# Patient Record
Sex: Male | Born: 1960
Health system: Southern US, Community
[De-identification: ages and names within clinical notes are randomized; demographics above are authoritative.]

## PROBLEM LIST (undated history)

## (undated) DIAGNOSIS — M7989 Other specified soft tissue disorders: Secondary | ICD-10-CM

## (undated) DIAGNOSIS — Z8249 Family history of ischemic heart disease and other diseases of the circulatory system: Secondary | ICD-10-CM

## (undated) DIAGNOSIS — K589 Irritable bowel syndrome without diarrhea: Secondary | ICD-10-CM

## (undated) DIAGNOSIS — K5792 Diverticulitis of intestine, part unspecified, without perforation or abscess without bleeding: Secondary | ICD-10-CM

## (undated) DIAGNOSIS — M549 Dorsalgia, unspecified: Secondary | ICD-10-CM

## (undated) DIAGNOSIS — R0789 Other chest pain: Secondary | ICD-10-CM

## (undated) DIAGNOSIS — I2510 Atherosclerotic heart disease of native coronary artery without angina pectoris: Secondary | ICD-10-CM

## (undated) DIAGNOSIS — K219 Gastro-esophageal reflux disease without esophagitis: Secondary | ICD-10-CM

## (undated) DIAGNOSIS — M2550 Pain in unspecified joint: Secondary | ICD-10-CM

## (undated) DIAGNOSIS — M1990 Unspecified osteoarthritis, unspecified site: Secondary | ICD-10-CM

## (undated) DIAGNOSIS — J302 Other seasonal allergic rhinitis: Secondary | ICD-10-CM

## (undated) DIAGNOSIS — K5900 Constipation, unspecified: Secondary | ICD-10-CM

## (undated) DIAGNOSIS — E785 Hyperlipidemia, unspecified: Secondary | ICD-10-CM

## (undated) DIAGNOSIS — K8020 Calculus of gallbladder without cholecystitis without obstruction: Secondary | ICD-10-CM

## (undated) DIAGNOSIS — F419 Anxiety disorder, unspecified: Secondary | ICD-10-CM

## (undated) HISTORY — DX: Other seasonal allergic rhinitis: J30.2

## (undated) HISTORY — DX: Other chest pain: R07.89

## (undated) HISTORY — DX: Hyperlipidemia, unspecified: E78.5

## (undated) HISTORY — DX: Anxiety disorder, unspecified: F41.9

## (undated) HISTORY — DX: Gastro-esophageal reflux disease without esophagitis: K21.9

## (undated) HISTORY — DX: Family history of ischemic heart disease and other diseases of the circulatory system: Z82.49

## (undated) HISTORY — DX: Unspecified osteoarthritis, unspecified site: M19.90

## (undated) HISTORY — PX: TONSILLECTOMY: SUR1361

## (undated) HISTORY — DX: Calculus of gallbladder without cholecystitis without obstruction: K80.20

## (undated) HISTORY — DX: Other specified soft tissue disorders: M79.89

## (undated) HISTORY — DX: Irritable bowel syndrome, unspecified: K58.9

## (undated) HISTORY — DX: Diverticulitis of intestine, part unspecified, without perforation or abscess without bleeding: K57.92

## (undated) HISTORY — DX: Constipation, unspecified: K59.00

## (undated) HISTORY — DX: Dorsalgia, unspecified: M54.9

## (undated) HISTORY — DX: Pain in unspecified joint: M25.50

---

## 1998-04-19 ENCOUNTER — Emergency Department (HOSPITAL_COMMUNITY): Admission: EM | Admit: 1998-04-19 | Discharge: 1998-04-19 | Payer: Self-pay | Admitting: Emergency Medicine

## 2003-03-23 ENCOUNTER — Emergency Department (HOSPITAL_COMMUNITY): Admission: EM | Admit: 2003-03-23 | Discharge: 2003-03-24 | Payer: Self-pay | Admitting: Emergency Medicine

## 2003-07-16 ENCOUNTER — Encounter: Admission: RE | Admit: 2003-07-16 | Discharge: 2003-07-16 | Payer: Self-pay | Admitting: Family Medicine

## 2003-09-10 ENCOUNTER — Encounter: Admission: RE | Admit: 2003-09-10 | Discharge: 2003-09-10 | Payer: Self-pay | Admitting: Family Medicine

## 2005-06-13 ENCOUNTER — Emergency Department (HOSPITAL_COMMUNITY): Admission: EM | Admit: 2005-06-13 | Discharge: 2005-06-13 | Payer: Self-pay | Admitting: Emergency Medicine

## 2005-07-12 ENCOUNTER — Ambulatory Visit: Payer: Self-pay | Admitting: Internal Medicine

## 2005-07-19 ENCOUNTER — Ambulatory Visit: Payer: Self-pay | Admitting: *Deleted

## 2005-07-27 ENCOUNTER — Ambulatory Visit: Payer: Self-pay | Admitting: Internal Medicine

## 2007-10-07 ENCOUNTER — Ambulatory Visit: Payer: Self-pay | Admitting: Internal Medicine

## 2007-10-07 DIAGNOSIS — E785 Hyperlipidemia, unspecified: Secondary | ICD-10-CM

## 2007-10-07 DIAGNOSIS — Z87442 Personal history of urinary calculi: Secondary | ICD-10-CM

## 2008-04-03 HISTORY — PX: CARDIAC CATHETERIZATION: SHX172

## 2008-05-20 ENCOUNTER — Ambulatory Visit: Payer: Self-pay | Admitting: Family Medicine

## 2008-05-20 DIAGNOSIS — R079 Chest pain, unspecified: Secondary | ICD-10-CM

## 2008-05-21 ENCOUNTER — Encounter (INDEPENDENT_AMBULATORY_CARE_PROVIDER_SITE_OTHER): Payer: Self-pay | Admitting: *Deleted

## 2008-06-23 ENCOUNTER — Encounter: Admission: RE | Admit: 2008-06-23 | Discharge: 2008-06-23 | Payer: Self-pay | Admitting: Cardiovascular Disease

## 2008-06-23 ENCOUNTER — Encounter: Payer: Self-pay | Admitting: Internal Medicine

## 2008-06-24 ENCOUNTER — Ambulatory Visit (HOSPITAL_COMMUNITY): Admission: RE | Admit: 2008-06-24 | Discharge: 2008-06-24 | Payer: Self-pay | Admitting: Cardiovascular Disease

## 2008-07-09 ENCOUNTER — Encounter: Payer: Self-pay | Admitting: Family Medicine

## 2008-10-08 ENCOUNTER — Ambulatory Visit: Payer: Self-pay | Admitting: Family Medicine

## 2008-10-09 ENCOUNTER — Encounter (INDEPENDENT_AMBULATORY_CARE_PROVIDER_SITE_OTHER): Payer: Self-pay | Admitting: *Deleted

## 2008-10-09 LAB — CONVERTED CEMR LAB
ALT: 31 units/L (ref 0–53)
AST: 23 units/L (ref 0–37)
Albumin: 3.9 g/dL (ref 3.5–5.2)
Alkaline Phosphatase: 66 units/L (ref 39–117)
BUN: 10 mg/dL (ref 6–23)
Basophils Absolute: 0 10*3/uL (ref 0.0–0.1)
Basophils Relative: 0.4 % (ref 0.0–3.0)
Bilirubin, Direct: 0.1 mg/dL (ref 0.0–0.3)
CO2: 29 meq/L (ref 19–32)
Calcium: 8.9 mg/dL (ref 8.4–10.5)
Chloride: 105 meq/L (ref 96–112)
Cholesterol: 180 mg/dL (ref 0–200)
Creatinine, Ser: 1 mg/dL (ref 0.4–1.5)
Eosinophils Absolute: 0.3 10*3/uL (ref 0.0–0.7)
Eosinophils Relative: 3.7 % (ref 0.0–5.0)
GFR calc non Af Amer: 85.1 mL/min (ref >60)
Glucose, Bld: 93 mg/dL (ref 70–99)
HCT: 44.4 % (ref 39.0–52.0)
HDL: 40.7 mg/dL (ref >39.00)
Hemoglobin: 15.1 g/dL (ref 13.0–17.0)
LDL Cholesterol: 125 mg/dL — ABNORMAL HIGH (ref 0–99)
Lymphocytes Relative: 19.8 % (ref 12.0–46.0)
Lymphs Abs: 1.5 10*3/uL (ref 0.7–4.0)
MCHC: 34 g/dL (ref 30.0–36.0)
MCV: 86.7 fL (ref 78.0–100.0)
Monocytes Absolute: 0.6 10*3/uL (ref 0.1–1.0)
Monocytes Relative: 8.1 % (ref 3.0–12.0)
Neutro Abs: 5.2 10*3/uL (ref 1.4–7.7)
Neutrophils Relative %: 68 % (ref 43.0–77.0)
Platelets: 243 10*3/uL (ref 150.0–400.0)
Potassium: 4 meq/L (ref 3.5–5.1)
RBC: 5.13 M/uL (ref 4.22–5.81)
RDW: 12.2 % (ref 11.5–14.6)
Sodium: 140 meq/L (ref 135–145)
TSH: 1.43 microintl units/mL (ref 0.35–5.50)
Total Bilirubin: 0.8 mg/dL (ref 0.3–1.2)
Total CHOL/HDL Ratio: 4
Total Protein: 6.6 g/dL (ref 6.0–8.3)
Triglycerides: 73 mg/dL (ref 0.0–149.0)
VLDL: 14.6 mg/dL (ref 0.0–40.0)
WBC: 7.6 10*3/uL (ref 4.5–10.5)

## 2010-01-05 ENCOUNTER — Encounter: Payer: Self-pay | Admitting: Internal Medicine

## 2010-04-01 ENCOUNTER — Ambulatory Visit
Admission: RE | Admit: 2010-04-01 | Discharge: 2010-04-01 | Payer: Self-pay | Source: Home / Self Care | Attending: Internal Medicine | Admitting: Internal Medicine

## 2010-04-01 DIAGNOSIS — K219 Gastro-esophageal reflux disease without esophagitis: Secondary | ICD-10-CM | POA: Insufficient documentation

## 2010-04-01 DIAGNOSIS — K6289 Other specified diseases of anus and rectum: Secondary | ICD-10-CM

## 2010-04-01 DIAGNOSIS — H0520 Unspecified exophthalmos: Secondary | ICD-10-CM | POA: Insufficient documentation

## 2010-04-01 LAB — CONVERTED CEMR LAB
Basophils Absolute: 0 10*3/uL (ref 0.0–0.1)
Basophils Relative: 0.4 % (ref 0.0–3.0)
Eosinophils Absolute: 0.3 10*3/uL (ref 0.0–0.7)
Eosinophils Relative: 3.4 % (ref 0.0–5.0)
H Pylori IgG: NEGATIVE
HCT: 45.1 % (ref 39.0–52.0)
Hemoglobin: 15.3 g/dL (ref 13.0–17.0)
Lymphocytes Relative: 20 % (ref 12.0–46.0)
Lymphs Abs: 1.9 10*3/uL (ref 0.7–4.0)
MCHC: 34 g/dL (ref 30.0–36.0)
MCV: 85.9 fL (ref 78.0–100.0)
Monocytes Absolute: 0.9 10*3/uL (ref 0.1–1.0)
Monocytes Relative: 9.3 % (ref 3.0–12.0)
Neutro Abs: 6.2 10*3/uL (ref 1.4–7.7)
Neutrophils Relative %: 66.9 % (ref 43.0–77.0)
Platelets: 291 10*3/uL (ref 150.0–400.0)
RBC: 5.25 M/uL (ref 4.22–5.81)
RDW: 14.1 % (ref 11.5–14.6)
TSH: 1.37 microintl units/mL (ref 0.35–5.50)
WBC: 9.2 10*3/uL (ref 4.5–10.5)

## 2010-05-03 NOTE — Letter (Signed)
Summary: [facility]  [facility]   Imported By: Lanelle Bal 01/21/2010 08:12:59  _____________________________________________________________________  External Attachment:    Type:   Image     Comment:   External Document

## 2010-05-05 NOTE — Assessment & Plan Note (Signed)
Summary: acid reflux, coughing alot, throat sore; hemorroids///sph   Vital Signs:  Patient profile:   50 year old male Weight:      318.2 pounds BMI:     36.44 Temp:     98.7 degrees F oral Pulse rate:   76 / minute Resp:     14 per minute BP sitting:   132 / 88  (left arm) Cuff size:   large  Vitals Entered By: Shonna Chock CMA (April 01, 2010 12:17 PM) CC: Acid reflux, sore throat & cough (related to acid reflux)- x 1 year or more and hemorrhoids x 1 year or more, Heartburn   CC:  Acid reflux, sore throat & cough (related to acid reflux)- x 1 year or more and hemorrhoids x 1 year or more, and Heartburn.  History of Present Illness:      This is a 50 year old man who presents with  GERD , progressive over past year  The patient reports acid reflux, sour taste in mouth, and chest pain ( EKG WNL 1 month ago @ Dr Hazle Coca), but denies epigastric pain and trouble swallowing.  The patient reports the following alarm features of dyspepsia: melena for 2 days.  The patient denies the following alarm features: dysphagia, hematemesis, vomiting, and history of anemia.  Symptoms are worse with spicy foods (tomatoes), sugar,and citrus.  No diagnostic studies to date.  Treatment that was tried  found to be  only partially effective  H2 blocker, Zantac 150  once daily - two times a day .Father had pancreatic cancer; no FH DUD, colon polyps or cancer.  Current Medications (verified): 1)  Zantac 150 Maximum Strength 150 Mg Tabs (Ranitidine Hcl) .[name].. 1 By Mouth Once Daily  Allergies (verified): No Known Drug Allergies  Review of Systems CV:  Denies leg cramps with exertion and palpitations; No exertional chest pain. GI:  rectal bleeding ? from hemorrhoids.  Physical Exam  General:  in no acute distress; alert,appropriate and cooperative throughout examination Eyes:  No corneal or conjunctival inflammation noted. Exopthalmus OD > OS. No lid lag FOV WNL Mouth:  Oral mucosa and oropharynx without  lesions or exudates.  Teeth in good repair. Mild pharyngeal erythema.   Neck:  No deformities, masses, or tenderness noted. Lungs:  Normal respiratory effort, chest expands symmetrically. Lungs are clear to auscultation, no crackles or wheezes. Heart:  Normal rate and regular rhythm. S1 and S2 normal without gallop, murmur, click, rub or other extra sounds. Abdomen:  Bowel sounds positive,abdomen soft and non-tender without masses, organomegaly or hernias noted. Rectal:  External   inflammation noted. Normal sphincter tone. No rectal masses or tenderness. No stool obtained  Pulses:  R and L carotid,radial,,dorsalis pedis and posterior tibial pulses are full and equal bilaterally Skin:  Intact without suspicious lesions or rashes. No jaundice Cervical Nodes:  No lymphadenopathy noted Axillary Nodes:  No palpable lymphadenopathy   Impression & Recommendations:  Problem # 1:  GERD (ICD-530.81)  The following medications were removed from the medication list:    Zantac 150 Maximum Strength 150 Mg Tabs (Ranitidine hcl) .[name]... 1 by mouth once daily His updated medication list for this problem includes:    Omeprazole 20 Mg Cpdr (Omeprazole) .[name]... 1 two times a day 30 min pre meals  Orders: Venipuncture (16109) TLB-CBC Platelet - w/Differential (85025-CBCD) TLB-H. Pylori Abs(Helicobacter Pylori) (86677-HELICO)  Problem # 2:  ANAL OR RECTAL PAIN (UEA-540.98)  Problem # 3:  EXOPHTHALMOS (ICD-376.30)  Orders: TLB-TSH (Thyroid Stimulating Hormone) (  84443-TSH)  Complete Medication List: 1)  Omeprazole 20 Mg Cpdr (Omeprazole) .[name].. 1 two times a day 30 min pre meals 2)  Proctofoam 1 % Foam (Pramoxine hcl) .... Apply two times a day after soaks  Patient Instructions: 1)  Complete stool cards. 2)  Avoid foods high in acid (tomatoes, citrus juices, spicy foods). Avoid eating within two hours of lying down or before exercising. Do not over eat; try smaller more frequent meals. Elevate head of bed  twelve inches when sleeping. Prescriptions: PROCTOFOAM 1 % FOAM (PRAMOXINE HCL) apply two times a day after soaks  #1 x 1   Entered and Authorized by:   Marga Melnick MD   Signed by:   Marga Melnick MD on 04/01/2010   Method used:   Print then Give to Patient   RxID:   [id number] OMEPRAZOLE 20 MG CPDR (OMEPRAZOLE) 1 two times a day 30 min pre meals  #60 x 1   Entered and Authorized by:   Marga Melnick MD   Signed by:   Marga Melnick MD on 04/01/2010   Method used:   Print then Give to Patient   RxID:   [id number]    Orders Added: 1)  Est. Patient Level IV [60109] 2)  Venipuncture [32355] 3)  TLB-CBC Platelet - w/Differential [85025-CBCD] 4)  TLB-H. Pylori Abs(Helicobacter Pylori) [86677-HELICO] 5)  TLB-TSH (Thyroid Stimulating Hormone) [73220-URK]

## 2010-06-02 ENCOUNTER — Encounter: Payer: Self-pay | Admitting: Internal Medicine

## 2010-08-06 ENCOUNTER — Encounter: Payer: Self-pay | Admitting: Internal Medicine

## 2010-08-10 ENCOUNTER — Encounter: Payer: Self-pay | Admitting: Internal Medicine

## 2010-08-16 NOTE — Cardiovascular Report (Signed)
NAME:  Dennis Castro, Dennis Castro NO.:  [id number]   MEDICAL RECORD NO.:  [id number]          PATIENT TYPE:  OIB   LOCATION:  2899                         FACILITY:  [facility]   PHYSICIAN:  Nanetta Batty, M.D.   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  He  had a negative Cardiolite on May 26, 2003, and was referred for  evaluation of chest pain.  His risk factor profile is benign except for  family history of heart disease and mother had PCI and CABG in her early  7s.  He also has a history of GERD; however, his symptoms of chest pain  are somewhat different than this.  A Myoview stress test recently  performed did show subtle anteroapical ischemia.  The patient presents  today for an outpatient diagnostic coronary arteriography to define his  anatomy and to rule out ischemic etiology.   PROCEDURE DESCRIPTION:  The patient was brought to the second floor  [place] cardiac cath lab in the postabsorptive state.  Premedicated  with p.o. Valium.  Right groin was prepped and shaved in the usual  sterile fashion.  Xylocaine 1% was used for local anesthesia.  A 6-  French sheath was inserted into the right femoral artery using standard  Seldinger technique.  A 6-French left Judkins diagnostic catheter as  well as 6-French pigtail catheter were used for selective coronary  angiography, left ventriculography respectively.  Visipaque dye was used  for the entirety of the case.  Aorta, ventricular and pullback pressures  were recorded.   HEMODYNAMICS:  1. Aortic systolic pressure 117 and diastolic pressure is 77.  2. Left ventricular systolic pressure 122, end-diastolic pressure 17.   SELECTIVE CHOLANGIOGRAPHY:  1. Left main normal.  2.  LAD; the LAD had a 30% hypodense segmental stenosis in the mid      third of the vessel.  3. Left circumflex; free of significant disease.  4. Ramus branch; small in size and free of significant disease.  5. Right coronary artery; dominant and free of significant disease.  6. Left ventriculography; RAO left ventriculogram was performed using      25 mL of Visipaque dye at 12 mL per second.  The overall LVEF was      estimated at approximately 50-55% with very subtle anteroapical      hypokinesia.   IMPRESSION:  Mr. Klopfenstein has noncritical coronary artery disease with  mild segmental mid left anterior descending disease and mild  anteroapical hypokinesia.  While this could conceivably be flow limiting  lesion, at this point, I am going to treat him medically with beta  blockers, statin drugs, and risk factor modification.  If indeed he  continues to have chest pain consistent with angina.  He may require  intravascular ultrasound-guided percutaneous coronary intervention  stenting of his mid-left anterior descending.   Sheath was removed and pressure was  held in the groin to achieve  hemostasis.  The patient left the lab in stable condition.  He will be  discharged home, and we will see him as an outpatient and will see me  back in the office in 1 or 2 weeks for followup.      Nanetta Batty, M.D.  Electronically Signed     JB/MEDQ  D:  06/24/2008  T:  06/24/2008  Job:  [id number]   cc:   Second Floor [facility]  [facility]  Neena Rhymes, M.D.

## 2010-08-19 NOTE — H&P (Signed)
NAME:  Dennis Castro, Dennis Castro               ACCOUNT NO.:  [id number]   MEDICAL RECORD NO.:  [id number]          PATIENT TYPE:  EMS   LOCATION:  MAJO                         FACILITY:  [facility]   PHYSICIAN:  Mobolaji B. Bakare, M.D.DATE OF BIRTH:  1960-10-27   DATE OF ADMISSION:  06/13/2005  DATE OF DISCHARGE:                                HISTORY & PHYSICAL   PRIMARY CARE PHYSICIAN:  Dr. Duaine Dredge   CHIEF COMPLAINT:  Chest tightness x2 weeks with no associated shortness of  breath during this period of chest tightness which lasts momentarily for  approximately 1-2 minutes. There is no wheezing. The patient does not smoke  cigarettes. Symptoms do not occur when he walks normally.   HISTORY OF PRESENT ILLNESS:  Dennis Castro is a 50 year old Caucasian male with  no significant medical problems. He started experiencing chest tightness on  exertion in the last 2 weeks. There is no clear cut chest pain. He has been  noticing shortness of breath whenever he gets to the end of a staircase at  home, and also when he lifts up his 49 pound 62-year-old son. There has been  no associated diaphoresis, nausea, vomiting, palpitations. He has does have  a history of acid reflux for which he uses Zantac as needed. He decided to  come to the emergency room for evaluation. He had an EKG which was normal  sinus rhythm, no ST changes. Cardiac enzymes at the point of care were  negative. He had a CT scan of the chest which was negative for pulmonary  embolism. There is no pericardial effusion. Normal CT. He has been admitted  for rule out myocardial infarction.   REVIEW OF SYSTEMS:  He denies fever, cough, shortness of breath, abdominal  pain, no change in micturition.   PAST MEDICAL HISTORY:  Gastroesophageal reflux disease.   PAST SURGICAL HISTORY:  None.   MEDICATIONS:  1.  Zantac 150 mg p.r.n.  2.  Gaviscon p.r.n.   ALLERGIES:  No known drug allergies.   FAMILY HISTORY:  His mother had bypass at age 19  after a heart attack.  Father has no known medical problems. He has three sisters and two brothers,  all are well.   SOCIAL HISTORY:  He is married. He has never smoked cigarettes and does not  drink alcohol. He is a Administrator, [employer].   PHYSICAL EXAMINATION:  VITAL SIGNS:  Temperature 98.5, blood pressure  120/69, pulse of 84, respiratory rate of 20, O2 saturations of 97%.  GENERAL:  The patient is obese, not in respiratory distress.  HEENT:  Normocephalic, atraumatic head. Pupils are equal, round and reactive  to light. Not pale. Anicteric. No elevated JVD. Mucosa red and moist.  LUNGS:  Clear clinically to auscultation. There is no wheeze, no rhonchi and  no crackles.  CARDIOVASCULAR:  S1, S2 regular, no murmur, no gallop or rub.  ABDOMEN:  Obese, soft, nontender. No palpable organomegaly, no holosystolic  murmur.  EXTREMITIES:  No pedal edema, calf tenderness. Dorsalis pedis pulses 2+  bilaterally.  CNS:  Nonfocal, no neurological deficit.   INITIAL LABORATORY  DATA:  White cell count 9.7 thousand, hemoglobin 13.8,  hematocrit 29.9, MCV 84.6, platelets 292,000.  Differential: Neutrophils 70%, lymphocytes 19%. Sodium 139, potassium 3.9,  chloride 107, BUN 13, glucose 90, bicarb 25. Hematocrit 42, hemoglobin 14.3.  Creatinine 1.0. BUN 13. PT 13.1, INR 1.0. Myoglobin 54.9. Troponin less than  0.05. Two sets of cardiac enzymes at point of care were negative.  EKG showed normal sinus rhythm with a heart rate of 76, normal intervals. CT  of chest with no definite acute PE, unremarkable CT. No pericardial  effusion, parenchymal's are normal. Chest x-ray with no acute  cardiopulmonary disease.   ASSESSMENT AND PLAN:  PROBLEM #1. Dennis Castro is a 50 year old Caucasian male  with no known cardiovascular risk factors, presenting with chest tightness  without any chest pain, wheezing or shortness of breath. Chest CT scan is  unremarkable.  I doubt this is related to cardiac  ischemia. The patient is obese, he is  probably deconditioned. Will admit to telemetry floor, obtain serial cardiac  enzymes, start aspirin 325 mg p.o. daily, nitroglycerin sublingual 1.4 mg  q.5 minutes p.r.n. for chest pain. Obtain fasting lipid profile, hemoglobin  A1C, fasting blood sugar.  PROBLEM #2. Gastroesophageal reflux disease. Will start on regular proton  pump inhibitor with Protonix 40 mg daily. He can use Gaviscon p.r.n.  PROBLEM #3 Obesity. Will counsel on weight loss. I spoke to patient with  respect to increasing activities. He indicated understanding and would  pursue weight loss.      Mobolaji B. Corky Downs, M.D.  Electronically Signed     MBB/MEDQ  D:  06/13/2005  T:  06/13/2005  Job:  [id number]   cc:   Mosetta Putt, M.D.  Fax: [phone]

## 2010-10-27 ENCOUNTER — Encounter: Payer: Self-pay | Admitting: Internal Medicine

## 2010-11-25 ENCOUNTER — Encounter: Payer: Self-pay | Admitting: Internal Medicine

## 2011-02-01 ENCOUNTER — Encounter: Payer: Self-pay | Admitting: Internal Medicine

## 2011-02-23 ENCOUNTER — Encounter (HOSPITAL_BASED_OUTPATIENT_CLINIC_OR_DEPARTMENT_OTHER): Payer: Self-pay | Admitting: *Deleted

## 2011-02-23 ENCOUNTER — Emergency Department (HOSPITAL_BASED_OUTPATIENT_CLINIC_OR_DEPARTMENT_OTHER)
Admission: EM | Admit: 2011-02-23 | Discharge: 2011-02-23 | Disposition: A | Discharge status: Home / Self Care | Payer: BC Managed Care – PPO | Attending: Emergency Medicine | Admitting: Emergency Medicine

## 2011-02-23 DIAGNOSIS — H5710 Ocular pain, unspecified eye: Secondary | ICD-10-CM | POA: Insufficient documentation

## 2011-02-23 DIAGNOSIS — S0500XA Injury of conjunctiva and corneal abrasion without foreign body, unspecified eye, initial encounter: Secondary | ICD-10-CM

## 2011-02-23 DIAGNOSIS — H18829 Corneal disorder due to contact lens, unspecified eye: Secondary | ICD-10-CM | POA: Insufficient documentation

## 2011-02-23 MED ORDER — FLUORESCEIN SODIUM 1 MG OP STRP
1.0000 | ORAL_STRIP | Freq: Once | OPHTHALMIC | Status: AC
  Administered 2011-02-23: 12:00:00 via OPHTHALMIC

## 2011-02-23 MED ORDER — GENTAMICIN SULFATE 0.3 % OP SOLN: 1.0000 [drp] | OPHTHALMIC | Status: AC

## 2011-02-23 MED ORDER — TETRACAINE HCL 0.5 % OP SOLN
2.0000 [drp] | Freq: Once | OPHTHALMIC | Status: AC
  Administered 2011-02-23: 12:00:00 via OPHTHALMIC

## 2011-02-23 MED ORDER — HYDROCODONE-ACETAMINOPHEN 5-500 MG PO TABS: 1.0000 | ORAL_TABLET | Freq: Four times a day (QID) | ORAL | Status: AC | PRN

## 2011-02-23 MED ORDER — TETRACAINE HCL 0.5 % OP SOLN
OPHTHALMIC | Status: AC
  Filled 2011-02-23: qty 2

## 2011-02-23 MED ORDER — FLUORESCEIN SODIUM 1 MG OP STRP
ORAL_STRIP | OPHTHALMIC | Status: AC
  Filled 2011-02-23: qty 1

## 2011-02-23 NOTE — ED Notes (Signed)
Pt amb to room 10 with quick steady gait. Pt reports he was wearing his contacts at work yesterday and noticed "an irritation" in his left eye. Pt states "It feels like there is something in there..." pt states he did not remove his contacts until 7pm last night, left eye is red and irritated.

## 2011-02-23 NOTE — ED Provider Notes (Addendum)
History     CSN: [id number] Arrival date & time: 02/23/2011 10:27 AM   First MD Initiated Contact with Patient 02/23/11 1127      Chief Complaint  Patient presents with  . Eye Problem     HPI  Presents with left eye pain. Patient complaining of 2 days of foreign body sensation in left eye. He thinks he left his context and atenolol. He complains of mild blurred vision in the left eye. He denies headache, nausea, vomiting. He did participate in leaf blowing approximately 5 days ago. He has been wearing his bifocals today.   ED Notes, ED Provider Notes from 02/23/11 0000 to 02/23/11 10:36:46       Amy Theotis Barrio, RN 02/23/2011 10:35      Pt amb to room 10 with quick steady gait. Pt reports he was wearing his contacts at work yesterday and noticed "an irritation" in his left eye. Pt states "It feels like there is something in there..." pt states he did not remove his contacts until 7pm last night, left eye is red and irritated.     History reviewed. No pertinent past medical history.  Past Surgical History  Procedure Date  . Tonsillectomy     Family History  Problem Relation Age of Onset  . Diabetes Mother   . Heart disease Mother     cabg  . Heart attack Mother   . Cancer Father     prostate cancer and panceatic cancer  . Cancer Maternal Grandmother     breast cancer    History  Substance Use Topics  . Smoking status: Never Smoker   . Smokeless tobacco: Not on file  . Alcohol Use: No    Review of Systems except as noted HPI  Allergies  Review of patient's allergies indicates no known allergies.  Home Medications   Current Outpatient Rx  Name Route Sig Dispense Refill  . GENTAMICIN SULFATE 0.3 % OP SOLN Left Eye Place 1 drop into the left eye every 4 (four) hours. 15 mL 0    6 times per day x 5 days  . HYDROCODONE-ACETAMINOPHEN 5-500 MG PO TABS Oral Take 1-2 tablets by mouth every 6 (six) hours as needed for pain. 15 tablet 0  . HYDROCORTISONE ACE-PRAMOXINE  1-1 % RE FOAM Rectal Place 1 applicator rectally 2 (two) times daily.      [name] OMEPRAZOLE 20 MG PO CPDR Oral Take 20 mg by mouth daily.        BP 136/82  Pulse 88  Temp(Src) 98.4 F (36.9 C) (Oral)  Resp 18  Ht 6\' 5"  (1.956 m)  Wt 300 lb (136.079 kg)  BMI 35.57 kg/m2  SpO2 100%  Physical Exam  Nursing note and vitals reviewed. Constitutional: He is oriented to person, place, and time. He appears well-developed and well-nourished. No distress.  HENT:  Head: Atraumatic.  Mouth/Throat: Oropharynx is clear and moist.  Eyes: Conjunctivae are normal. Pupils are equal, round, and reactive to light. Left eye exhibits no discharge.       Left eye injected conjunctiva Relief of pain with tetracaine administration No FB under eyelid Small linear uptake flouroscein under UV light  Neck: Neck supple.  Cardiovascular: Normal rate, regular rhythm, normal heart sounds and intact distal pulses.  Exam reveals no gallop and no friction rub.   No murmur heard. Pulmonary/Chest: Effort normal. No respiratory distress. He has no wheezes. He has no rales.  Abdominal: Soft. Bowel sounds are normal. There is no tenderness.  There is no rebound and no guarding.  Musculoskeletal: Normal range of motion. He exhibits no edema and no tenderness.  Neurological: He is alert and oriented to person, place, and time.  Skin: Skin is warm and dry.  Psychiatric: He has a normal mood and affect.    ED Course  Procedures (including critical care time)  Labs Reviewed - No data to display No results found.   1. Corneal abrasion       MDM  Corneal abrasion with slightly dec visual acuity Lt eye. Pain control, abx with pseudomonas coverage. Advised against wearing contacts until seen in f/u with ophtho. Given precautions for return.   Stefano Gaul, MD         Forbes Cellar, MD 02/23/11 1542  Forbes Cellar, MD 02/23/11 [phone]

## 2011-04-18 ENCOUNTER — Encounter: Payer: Self-pay | Admitting: Internal Medicine

## 2011-06-22 ENCOUNTER — Encounter: Payer: Self-pay | Admitting: Internal Medicine

## 2011-08-25 ENCOUNTER — Encounter: Payer: Self-pay | Admitting: Internal Medicine

## 2011-10-31 ENCOUNTER — Encounter: Payer: Self-pay | Admitting: Internal Medicine

## 2012-01-05 ENCOUNTER — Encounter: Payer: Self-pay | Admitting: Internal Medicine

## 2012-01-16 ENCOUNTER — Ambulatory Visit (INDEPENDENT_AMBULATORY_CARE_PROVIDER_SITE_OTHER): Payer: BC Managed Care – PPO | Admitting: Internal Medicine

## 2012-01-16 ENCOUNTER — Ambulatory Visit (HOSPITAL_BASED_OUTPATIENT_CLINIC_OR_DEPARTMENT_OTHER)
Admission: RE | Admit: 2012-01-16 | Discharge: 2012-01-16 | Disposition: A | Discharge status: Home / Self Care | Payer: BC Managed Care – PPO | Source: Ambulatory Visit | Attending: Internal Medicine | Admitting: Internal Medicine

## 2012-01-16 ENCOUNTER — Encounter: Payer: Self-pay | Admitting: Internal Medicine

## 2012-01-16 VITALS — BP 126/88 | HR 74 | Temp 98.5°F | Wt 321.0 lb

## 2012-01-16 DIAGNOSIS — Z8249 Family history of ischemic heart disease and other diseases of the circulatory system: Secondary | ICD-10-CM

## 2012-01-16 DIAGNOSIS — E785 Hyperlipidemia, unspecified: Secondary | ICD-10-CM

## 2012-01-16 DIAGNOSIS — K219 Gastro-esophageal reflux disease without esophagitis: Secondary | ICD-10-CM

## 2012-01-16 DIAGNOSIS — R079 Chest pain, unspecified: Secondary | ICD-10-CM

## 2012-01-16 DIAGNOSIS — R0789 Other chest pain: Secondary | ICD-10-CM | POA: Insufficient documentation

## 2012-01-16 LAB — CK TOTAL AND CKMB (NOT AT ARMC)
CK, MB: 2 ng/mL (ref 0.3–4.0)
Total CK: 64 U/L (ref 7–232)

## 2012-01-16 LAB — TROPONIN I: Troponin I: <0.01 ng/mL (ref <0.06)

## 2012-01-16 NOTE — Progress Notes (Signed)
Subjective:    Patient ID: Dennis Castro, male    DOB: Sep 17, 1960, 51 y.o.   MRN: [id number]  HPI He describes intermittent "jab or burning" type left chest pain lasting a split second since 01/15/12. It occurs at rest every 30-60 minutes without predisposition.  There is no associated dyspnea, nausea, or sweating. He denies palpitations, paroxysmal nocturnal dyspnea,cough, hemoptysis, pleuritic character, or claudication. He questions whether he has some ankle edema.  Lipids were done at his job 6 months ago; they were "borderline". He had an essentially negative cardiac catheterization in 2010 by Dr. Allyson Sabal.    Past medical history/family history/social history were all reviewed and updated. Pertinent data: Mother MI @ 60  Review of Systems   There was no predisposition such as immobilization, trauma or repetitive action or lifting. There is no definite relieving factor. He's had no associated dizziness or syncope.  He denies associated abdominal pain , dysphagia, rectal bleeding or melena. He previously took omeprazole for reflux but now takes Zantac as needed.        Objective:   Physical Exam General appearance :well nourished w/o distress.  Eyes: No conjunctival inflammation or scleral icterus is present.Slight anisocoria ; OS > OD  Oral exam: Dental hygiene is good; lips and gums are healthy appearing.There is no oropharyngeal erythema or exudate noted.   Heart:  Normal rate and regular rhythm. S1 and S2 normal without gallop, murmur, click, rub. S 4   No costochondritic pain elicitable  Lungs:Chest clear to auscultation; no wheezes, rhonchi,rales ,or rubs present.No increased work of breathing.   Abdomen: bowel sounds normal, soft and non-tender without masses, organomegaly or hernias noted.  No guarding or rebound   Skin:Warm & dry.  Intact without suspicious lesions or rashes ; no jaundice or tenting  Lymphatic: No lymphadenopathy is noted about the head, neck,  axilla areas.  Vascular: All pulses intact without bruits  Extremities: No cyanosis, clubbing, or edema. Homans sign negative  Neuro: Alert and oriented              Assessment & Plan:  #1 atypical chest pain; EKG reveals minor early repolarization ST-T changes in 1 PAC. No ischemic changes present  #2 dyslipidemia  #3 past history of reflux  Plan: See orders and recommendations

## 2012-01-16 NOTE — Patient Instructions (Addendum)
The triggers for reflux  include stress; the "aspirin family" ; alcohol; peppermint; and caffeine (coffee, tea, cola, and chocolate). The aspirin family would include aspirin and the nonsteroidal agents such as ibuprofen &  Naproxen. Tylenol would not cause reflux. If having symptoms ; food & drink should be avoided for @ least 2 hours before going to bed. Take Zantac twice a day pending completion of evaluation. EKG is normal but there are minor ST-T changes of early repolarization. These are normal variants but could be mistaken for acute injury. This EKG should be available for comparison if  seen emergently. To prevent  premature beats, avoid stimulants such as decongestants, diet pills, nicotine, or caffeine (coffee, tea, cola, or chocolate) to excess.  Please take enteric-coated aspirin 81 mg daily with breakfast.  Order for x-rays entered into  the computer; these will be performed at [facility] No appointment is necessary.   If you activate My Chart; the results can be released to you as soon as they populate from the lab. If you choose not to use this program; the labs have to be reviewed, copied & mailed causing a delay in getting the results to you.

## 2012-01-17 LAB — CBC WITH DIFFERENTIAL/PLATELET
Basophils Absolute: 0.1 10*3/uL (ref 0.0–0.1)
Basophils Relative: 0.7 % (ref 0.0–3.0)
Eosinophils Absolute: 0.3 10*3/uL (ref 0.0–0.7)
Eosinophils Relative: 2.9 % (ref 0.0–5.0)
HCT: 46 % (ref 39.0–52.0)
Hemoglobin: 15.1 g/dL (ref 13.0–17.0)
Lymphocytes Relative: 18.5 % (ref 12.0–46.0)
Lymphs Abs: 1.8 10*3/uL (ref 0.7–4.0)
MCHC: 32.8 g/dL (ref 30.0–36.0)
MCV: 86.8 fl (ref 78.0–100.0)
Monocytes Absolute: 0.7 10*3/uL (ref 0.1–1.0)
Monocytes Relative: 7.1 % (ref 3.0–12.0)
Neutro Abs: 6.7 10*3/uL (ref 1.4–7.7)
Neutrophils Relative %: 70.8 % (ref 43.0–77.0)
Platelets: 275 10*3/uL (ref 150.0–400.0)
RBC: 5.3 Mil/uL (ref 4.22–5.81)
RDW: 13.5 % (ref 11.5–14.6)
WBC: 9.5 10*3/uL (ref 4.5–10.5)

## 2012-01-17 LAB — NMR, LIPOPROFILE

## 2012-02-05 ENCOUNTER — Encounter: Payer: Self-pay | Admitting: Internal Medicine

## 2012-02-19 ENCOUNTER — Encounter: Payer: Self-pay | Admitting: Internal Medicine

## 2012-03-25 ENCOUNTER — Encounter: Payer: Self-pay | Admitting: Internal Medicine

## 2012-03-25 ENCOUNTER — Ambulatory Visit (INDEPENDENT_AMBULATORY_CARE_PROVIDER_SITE_OTHER): Payer: BC Managed Care – PPO | Admitting: Internal Medicine

## 2012-03-25 VITALS — BP 118/86 | HR 71 | Temp 98.3°F | Resp 16 | Ht 78.03 in | Wt 325.4 lb

## 2012-03-25 DIAGNOSIS — Z8042 Family history of malignant neoplasm of prostate: Secondary | ICD-10-CM | POA: Insufficient documentation

## 2012-03-25 DIAGNOSIS — R6882 Decreased libido: Secondary | ICD-10-CM

## 2012-03-25 DIAGNOSIS — Z0000 Encounter for general adult medical examination without abnormal findings: Secondary | ICD-10-CM

## 2012-03-25 DIAGNOSIS — Z1211 Encounter for screening for malignant neoplasm of colon: Secondary | ICD-10-CM

## 2012-03-25 DIAGNOSIS — N529 Male erectile dysfunction, unspecified: Secondary | ICD-10-CM

## 2012-03-25 LAB — LIPID PANEL
Cholesterol: 188 mg/dL (ref 0–200)
HDL: 40.1 mg/dL (ref >39.00)
LDL Cholesterol: 125 mg/dL — ABNORMAL HIGH (ref 0–99)
Total CHOL/HDL Ratio: 5
Triglycerides: 115 mg/dL (ref 0.0–149.0)
VLDL: 23 mg/dL (ref 0.0–40.0)

## 2012-03-25 LAB — HEPATIC FUNCTION PANEL
ALT: 40 U/L (ref 0–53)
AST: 20 U/L (ref 0–37)
Albumin: 4 g/dL (ref 3.5–5.2)
Alkaline Phosphatase: 67 U/L (ref 39–117)
Bilirubin, Direct: 0.1 mg/dL (ref 0.0–0.3)
Total Bilirubin: 0.7 mg/dL (ref 0.3–1.2)
Total Protein: 6.5 g/dL (ref 6.0–8.3)

## 2012-03-25 LAB — PSA: PSA: 0.91 ng/mL (ref 0.10–4.00)

## 2012-03-25 LAB — BASIC METABOLIC PANEL
BUN: 16 mg/dL (ref 6–23)
CO2: 24 mEq/L (ref 19–32)
Calcium: 9.3 mg/dL (ref 8.4–10.5)
Chloride: 105 mEq/L (ref 96–112)
Creatinine, Ser: 1 mg/dL (ref 0.4–1.5)
GFR: 85.86 mL/min (ref >60.00)
Glucose, Bld: 89 mg/dL (ref 70–99)
Potassium: 4.3 mEq/L (ref 3.5–5.1)
Sodium: 138 mEq/L (ref 135–145)

## 2012-03-25 LAB — TESTOSTERONE: Testosterone: 183.19 ng/dL — ABNORMAL LOW (ref 350.00–890.00)

## 2012-03-25 LAB — TSH: TSH: 1.41 u[IU]/mL (ref 0.35–5.50)

## 2012-03-25 MED ORDER — SILDENAFIL CITRATE 100 MG PO TABS: 50.0000 mg | ORAL_TABLET | Freq: Every day | ORAL | Status: DC | PRN

## 2012-03-25 NOTE — Patient Instructions (Addendum)
Preventive Health Care: Exercise at least 30-45 minutes a day,  3-4 days a week.  Eat a low-fat diet with lots of fruits and vegetables, up to 7-9 servings per day. As discussed your goal is waist measurement < 40 inches.Consume less than 40 grams  (preferably ZERO) of sugar per day from foods & drinks with High Fructose Corn Sugar as #1,2,3 or # 4 on label. Health Care Power of Attorney & Living Will. Complete if not in place ; these place you in charge of your health care decisions. As per the Standard of Care , screening Colonoscopy recommended @ 50 & every 5-10 years thereafter . More frequent monitor would be dictated by family history or findings @ Colonoscopy

## 2012-03-25 NOTE — Progress Notes (Signed)
Subjective:    Patient ID: Dennis Castro, male    DOB: Aug 22, 1960, 51 y.o.   MRN: [id number]  HPI  Dennis Castro is here for a physical;acute issues include decreased libido & mild ED symptoms.   Past medical history/family history/social history were all reviewed and updated. Pertinent data: father had prostate cancer.    Review of Systems  No medication trial for ED to date.    Objective:   Physical Exam Gen.:  well-nourished in appearance. Alert, appropriate and cooperative throughout exam. Head: Normocephalic without obvious abnormalities  Eyes: No corneal or conjunctival inflammation noted. Pupils equal round reactive to light and accommodation. Fundal exam is benign without hemorrhages, exudate, papilledema. Extraocular motion intact. Vision grossly normal.Prominent globes.Minimal vertical nystagmus  Ears: External  ear exam reveals no significant lesions or deformities. Canals clear .TMs normal. Hearing is grossly normal bilaterally. Nose: External nasal exam reveals no deformity or inflammation. Nasal mucosa are pink and moist. No lesions or exudates noted. Mouth: Oral mucosa and oropharynx reveal no lesions or exudates. Teeth in good repair. Neck: No deformities, masses, or tenderness noted. Range of motion & Thyroid normal. Lungs: Normal respiratory effort; chest expands symmetrically. Lungs are clear to auscultation without rales, wheezes, or increased work of breathing. Heart: Normal rate and rhythm. Normal S1 and S2. No gallop, click, or rub. S4 w/o murmur. Abdomen: Bowel sounds normal; abdomen soft and nontender. No masses, organomegaly or hernias noted. Genitalia/DRE: Genitalia normal except for small left varices Prostate is normal without enlargement, asymmetry, nodularity, or induration.  Musculoskeletal/extremities: No deformity or scoliosis noted of  the thoracic or lumbar spine. No clubbing, cyanosis, edema, or deformity noted. Range of motion  normal .Tone & strength   normal.Joints normal. Nail health  good. Vascular: Carotid, radial artery, dorsalis pedis and  posterior tibial pulses are full and equal. No bruits present. Neurologic: Alert and oriented x3. Deep tendon reflexes symmetrical and normal.          Skin: Intact without suspicious lesions or rashes. Lymph: No cervical, axillary, or inguinal lymphadenopathy present. Psych: Mood and affect are normal. Normally interactive                                                                                         Assessment & Plan:  #1 comprehensive physical exam; no acute findings #2 decreased libido & mild ED Plan: see Orders

## 2012-03-26 ENCOUNTER — Encounter: Payer: Self-pay | Admitting: Gastroenterology

## 2012-05-08 ENCOUNTER — Ambulatory Visit (AMBULATORY_SURGERY_CENTER): Payer: BC Managed Care – PPO

## 2012-05-08 VITALS — Ht 77.0 in | Wt 334.4 lb

## 2012-05-08 DIAGNOSIS — Z1211 Encounter for screening for malignant neoplasm of colon: Secondary | ICD-10-CM

## 2012-05-08 MED ORDER — NA SULFATE-K SULFATE-MG SULF 17.5-3.13-1.6 GM/177ML PO SOLN: 1.0000 | Freq: Once | ORAL | Status: DC

## 2012-05-09 ENCOUNTER — Encounter: Payer: Self-pay | Admitting: Gastroenterology

## 2012-05-14 ENCOUNTER — Encounter: Payer: BC Managed Care – PPO | Admitting: Gastroenterology

## 2012-05-17 ENCOUNTER — Encounter: Payer: Self-pay | Admitting: Internal Medicine

## 2012-05-20 ENCOUNTER — Telehealth: Payer: Self-pay | Admitting: Internal Medicine

## 2012-05-20 NOTE — Telephone Encounter (Signed)
Message copied by Verner Chol on Mon May 20, 2012 12:50 PM ------      Message from: [facility], FELICIA L      Created: Mon May 20, 2012 12:06 PM       Can you follow up on this Pt for me thanlks ------

## 2012-05-20 NOTE — Telephone Encounter (Signed)
Sent to [facility] for review [employer] stated pt was not covered during DOS

## 2012-05-24 ENCOUNTER — Encounter: Payer: Self-pay | Admitting: Gastroenterology

## 2012-05-24 ENCOUNTER — Ambulatory Visit (AMBULATORY_SURGERY_CENTER): Payer: BC Managed Care – PPO | Admitting: Gastroenterology

## 2012-05-24 VITALS — BP 123/78 | HR 66 | Temp 97.0°F | Resp 11 | Ht 77.0 in | Wt 334.0 lb

## 2012-05-24 DIAGNOSIS — Z1211 Encounter for screening for malignant neoplasm of colon: Secondary | ICD-10-CM

## 2012-05-24 DIAGNOSIS — K5730 Diverticulosis of large intestine without perforation or abscess without bleeding: Secondary | ICD-10-CM

## 2012-05-24 MED ORDER — HYDROCORTISONE ACETATE 25 MG RE SUPP: 25.0000 mg | Freq: Two times a day (BID) | RECTAL | Status: AC

## 2012-05-24 MED ORDER — SODIUM CHLORIDE 0.9 % IV SOLN: 500.0000 mL | INTRAVENOUS | Status: DC

## 2012-05-24 NOTE — Op Note (Signed)
[facility] [address] [place], [address]   COLONOSCOPY PROCEDURE REPORT  PATIENT: Chanel, Mckesson  MR#: [id number] BIRTHDATE: Jul 25, 1960 , 51  yrs. old GENDER: Male ENDOSCOPIST: Louis Meckel, MD REFERRED AO:[id number] Alwyn Ren, M.D. PROCEDURE DATE:  05/24/2012 PROCEDURE:   Colonoscopy, diagnostic ASA CLASS:   Class II INDICATIONS:average risk screening. MEDICATIONS: MAC sedation, administered by CRNA and propofol (Diprivan) 200mg  IV  DESCRIPTION OF PROCEDURE:   After the risks benefits and alternatives of the procedure were thoroughly explained, informed consent was obtained.  A digital rectal exam revealed no abnormalities of the rectum.   The LB CF-[id number]  endoscope was introduced through the anus and advanced to the cecum, which was identified by both the appendix and ileocecal valve. No adverse events experienced.   The quality of the prep was Suprep good  The instrument was then slowly withdrawn as the colon was fully examined.      COLON FINDINGS: Moderate diverticulosis was noted in the sigmoid colon and descending colon.  Retroflexed views revealed no abnormalities. The time to cecum=2 minutes 04 seconds.  Withdrawal time=8 minutes 04 seconds.  The scope was withdrawn and the procedure completed. COMPLICATIONS: There were no complications.  ENDOSCOPIC IMPRESSION: Moderate diverticulosis was noted in the sigmoid colon and descending colon  RECOMMENDATIONS: Continue current colorectal screening recommendations for "routine risk" patients with a repeat colonoscopy in 10 years.   eSigned:  Louis Meckel, MD 05/24/2012 2:22 PM   cc:

## 2012-05-24 NOTE — Patient Instructions (Addendum)
Discharge instructions given with verbal understanding. Handouts on diverticulosis and a high fiber diet given. Resume previous medications.YOU HAD AN ENDOSCOPIC PROCEDURE TODAY AT THE [facility]: Refer to the procedure report that was given to you for any specific questions about what was found during the examination.  If the procedure report does not answer your questions, please call your gastroenterologist to clarify.  If you requested that your care partner not be given the details of your procedure findings, then the procedure report has been included in a sealed envelope for you to review at your convenience later.  YOU SHOULD EXPECT: Some feelings of bloating in the abdomen. Passage of more gas than usual.  Walking can help get rid of the air that was put into your GI tract during the procedure and reduce the bloating. If you had a lower endoscopy (such as a colonoscopy or flexible sigmoidoscopy) you may notice spotting of blood in your stool or on the toilet paper. If you underwent a bowel prep for your procedure, then you may not have a normal bowel movement for a few days.  DIET: Your first meal following the procedure should be a light meal and then it is ok to progress to your normal diet.  A half-sandwich or bowl of soup is an example of a good first meal.  Heavy or fried foods are harder to digest and may make you feel nauseous or bloated.  Likewise meals heavy in dairy and vegetables can cause extra gas to form and this can also increase the bloating.  Drink plenty of fluids but you should avoid alcoholic beverages for 24 hours.  ACTIVITY: Your care partner should take you home directly after the procedure.  You should plan to take it easy, moving slowly for the rest of the day.  You can resume normal activity the day after the procedure however you should NOT DRIVE or use heavy machinery for 24 hours (because of the sedation medicines used during the test).    SYMPTOMS TO  REPORT IMMEDIATELY: A gastroenterologist can be reached at any hour.  During normal business hours, 8:30 AM to 5:00 PM Monday through Friday, call [phone].  After hours and on weekends, please call the GI answering service at [phone] who will take a message and have the physician on call contact you.   Following lower endoscopy (colonoscopy or flexible sigmoidoscopy):  Excessive amounts of blood in the stool  Significant tenderness or worsening of abdominal pains  Swelling of the abdomen that is new, acute  Fever of 100F or higher  FOLLOW UP: If any biopsies were taken you will be contacted by phone or by letter within the next 1-3 weeks.  Call your gastroenterologist if you have not heard about the biopsies in 3 weeks.  Our staff will call the home number listed on your records the next business day following your procedure to check on you and address any questions or concerns that you may have at that time regarding the information given to you following your procedure. This is a courtesy call and so if there is no answer at the home number and we have not heard from you through the emergency physician on call, we will assume that you have returned to your regular daily activities without incident.  SIGNATURES/CONFIDENTIALITY: You and/or your care partner have signed paperwork which will be entered into your electronic medical record.  These signatures attest to the fact that that the information above on your   After Visit Summary has been reviewed and is understood.  Full responsibility of the confidentiality of this discharge information lies with you and/or your care-partner.   

## 2012-05-24 NOTE — Progress Notes (Signed)
Patient did not experience any of the following events: a burn prior to discharge; a fall within the facility; wrong site/side/patient/procedure/implant event; or a hospital transfer or hospital admission upon discharge from the facility. (G8907) Patient did not have preoperative order for IV antibiotic SSI prophylaxis. (G8918)  

## 2012-05-27 ENCOUNTER — Telehealth: Payer: Self-pay | Admitting: *Deleted

## 2012-05-27 NOTE — Telephone Encounter (Signed)
  Follow up Call-  Call back number 05/24/2012  Post procedure Call Back phone  # [phone]  Permission to leave phone message Yes     Patient questions:  Do you have a fever, pain , or abdominal swelling? no Pain Score  0 *  Have you tolerated food without any problems? yes  Have you been able to return to your normal activities? yes  Do you have any questions about your discharge instructions: Diet   no Medications  no Follow up visit  no  Do you have questions or concerns about your Care? no  Actions: * If pain score is 4 or above: No action needed, pain <4.

## 2013-02-06 ENCOUNTER — Other Ambulatory Visit: Payer: Self-pay

## 2013-08-01 ENCOUNTER — Encounter: Payer: Self-pay | Admitting: Nurse Practitioner

## 2013-08-01 ENCOUNTER — Ambulatory Visit (INDEPENDENT_AMBULATORY_CARE_PROVIDER_SITE_OTHER): Payer: BC Managed Care – PPO | Admitting: Nurse Practitioner

## 2013-08-01 ENCOUNTER — Telehealth: Payer: Self-pay | Admitting: Internal Medicine

## 2013-08-01 VITALS — BP 124/84 | HR 68 | Temp 98.0°F | Ht 78.02 in | Wt 330.0 lb

## 2013-08-01 DIAGNOSIS — R109 Unspecified abdominal pain: Secondary | ICD-10-CM | POA: Insufficient documentation

## 2013-08-01 NOTE — Progress Notes (Signed)
Subjective:     Charleston PootJoseph E Durell is a 53 y.o. male who presents for evaluation of abdominal pain. Onset was 5 hours ago, after eating breakfast. Symptoms have been gradually improving. The pain is described as aching, cramping and pressure-like, and is 3/10 in intensity. Pain is located in the periumbilical region without radiation.  Aggravating factors: none.  Alleviating factors: none. Associated symptoms: belching. The patient denies constipation, diarrhea, fever, flatus and nausea. He has history of umbilical hernia, kidney stone, and was told he has diverticuli at last colonoscopy.  The patient's history has been marked as reviewed and updated as appropriate.  Review of Systems Pertinent items are noted in HPI.     Objective:    BP 124/84  Pulse 68  Temp(Src) 98 F (36.7 C) (Oral)  Ht 6' 6.02" (1.982 m)  Wt 330 lb (149.687 kg)  BMI 38.10 kg/m2  SpO2 95% General appearance: alert, cooperative, appears stated age and no distress Head: Normocephalic, without obvious abnormality, atraumatic Eyes: negative findings: lids and lashes normal and conjunctivae and sclerae normal Back: no CVA tenderness Lungs: clear to auscultation bilaterally Heart: regular rate and rhythm, S1, S2 normal, no murmur, click, rub or gallop Abdomen: soft, non-tender; bowel sounds normal; no masses,  no organomegaly and obese. Hyperactive abd sounds. superficial lipomas RUQ & LUQ.     Assessment:    Abdominal pain, likely secondary to diverticulosis .    Plan:   Clear liquids. Advance diet over several days. Call if develop fever. If no improvement, consider abd US or CT scan. See pt instructions. F/u 2 weeks.

## 2013-08-01 NOTE — Patient Instructions (Addendum)
Pain may be related to diverticulitis. Clear liquids only for 24 hours-clear drinks, flavored broths, jello, frozen popsicles. If pain is better, OK to advance to full liquids for 48 hours (liquid you cannot see through when hold up to light)-thick soups, pudding, yogurt, milkshake. If no return in pain eat soft foods for 2-3 days-scrambled egg, soft breads, potatoes, rice, steamed vegetables, soft peas, beans.  If you develop fever, please call for re-evaluation. If pain gets worse over weekend and you develop nausea & vomting, go to ER.  F/u in 2 weeks  Diverticulitis A diverticulum is a small pouch or sac on the colon. Diverticulosis is the presence of these diverticula on the colon. Diverticulitis is the irritation (inflammation) or infection of diverticula. CAUSES  The colon and its diverticula contain bacteria. If food particles block the tiny opening to a diverticulum, the bacteria inside can grow and cause an increase in pressure. This leads to infection and inflammation and is called diverticulitis. SYMPTOMS   Abdominal pain and tenderness. Usually, the pain is located on the left side of your abdomen. However, it could be located elsewhere.  Fever.  Bloating.  Feeling sick to your stomach (nausea).  Throwing up (vomiting).  Abnormal stools. DIAGNOSIS  Your caregiver will take a history and perform a physical exam. Since many things can cause abdominal pain, other tests may be necessary. Tests may include:  Blood tests.  Urine tests.  X-ray of the abdomen.  CT scan of the abdomen. Sometimes, surgery is needed to determine if diverticulitis or other conditions are causing your symptoms. TREATMENT  Most of the time, you can be treated without surgery. Treatment includes:  Resting the bowels by only having liquids for a few days. As you improve, you will need to eat a low-fiber diet.  Intravenous (IV) fluids if you are losing body fluids (dehydrated).  Antibiotic  medicines that treat infections may be given.  Pain and nausea medicine, if needed.  Surgery if the inflamed diverticulum has burst. HOME CARE INSTRUCTIONS   Try a clear liquid diet (broth, tea, or water for as long as directed by your caregiver). You may then gradually begin a low-fiber diet as tolerated.  A low-fiber diet is a diet with less than 10 grams of fiber. Choose the foods below to reduce fiber in the diet:  White breads, cereals, rice, and pasta.  Cooked fruits and vegetables or soft fresh fruits and vegetables without the skin.  Ground or well-cooked tender beef, ham, veal, lamb, pork, or poultry.  Eggs and seafood.  After your diverticulitis symptoms have improved, your caregiver may put you on a high-fiber diet. A high-fiber diet includes 14 grams of fiber for every 1000 calories consumed. For a standard 2000 calorie diet, you would need 28 grams of fiber. Follow these diet guidelines to help you increase the fiber in your diet. It is important to slowly increase the amount fiber in your diet to avoid gas, constipation, and bloating.  Choose whole-grain breads, cereals, pasta, and brown rice.  Choose fresh fruits and vegetables with the skin on. Do not overcook vegetables because the more vegetables are cooked, the more fiber is lost.  Choose more nuts, seeds, legumes, dried peas, beans, and lentils.  Look for food products that have greater than 3 grams of fiber per serving on the Nutrition Facts label.  Take all medicine as directed by your caregiver.  If your caregiver has given you a follow-up appointment, it is very important that you go.  Not going could result in lasting (chronic) or permanent injury, pain, and disability. If there is any problem keeping the appointment, call to reschedule. SEEK MEDICAL CARE IF:   Your pain does not improve.  You have a hard time advancing your diet beyond clear liquids.  Your bowel movements do not return to normal. SEEK  IMMEDIATE MEDICAL CARE IF:   Your pain becomes worse.  You have an oral temperature above 102 F (38.9 C), not controlled by medicine.  You have repeated vomiting.  You have bloody or black, tarry stools.  Symptoms that brought you to your caregiver become worse or are not getting better. MAKE SURE YOU:   Understand these instructions.  Will watch your condition.  Will get help right away if you are not doing well or get worse. Document Released: 12/28/2004 Document Revised: 06/12/2011 Document Reviewed: 04/25/2010 [facility] Patient Information 2014 [employer].

## 2013-08-01 NOTE — Telephone Encounter (Signed)
Patient Information:  Caller Name: Jomarie LongsJoseph  Phone: [phone]  Patient: Dennis Castro, Dennis Castro  Gender: Male  DOB: 08/13/1960  Age: 53 Years  PCP: Willow OraPaz, Jose  Office Follow Up:  Does the office need to follow up with this patient?: No  Instructions For The Office: N/A  RN Note:  Ed disposition, [facility] and instructions were for patient to see ed, no office appt. available;  Patient verbalizes understanding.  Symptoms  Reason For Call & Symptoms: Onset about 9-10 am 08/01/13 of abdominal pain, pressure aching feeling and getting nauseated;  Starts above navel area, goes around the sides, abdomen is distended;  He says it is "blown up";  Last bm was this am and reports nothing different;  Afebrile;  At first it was coming and going but has been constant, aching, for the last 20 minutes.  Reviewed Health History In EMR: Yes  Reviewed Medications In EMR: Yes  Reviewed Allergies In EMR: Yes  Reviewed Surgeries / Procedures: Yes  Date of Onset of Symptoms: 08/01/2013  Guideline(s) Used:  Abdominal Pain - Male  Abdominal Pain - Male  Abdominal Pain - Upper  Disposition Per Guideline:   Go to ED Now  Reason For Disposition Reached:   Pain lasting > 10 minutes and over 52 years old  Advice Given:  N/A  Patient Will Follow Care Advice:  YES

## 2013-08-01 NOTE — Progress Notes (Signed)
Pre visit review using our clinic review tool, if applicable. No additional management support is needed unless otherwise documented below in the visit note. 

## 2013-08-01 NOTE — Telephone Encounter (Signed)
Spoke with patient and advised that appt was available today at our OR office. Appt made w/ Dennis Castro at 2:30.

## 2014-01-16 ENCOUNTER — Other Ambulatory Visit: Payer: Self-pay

## 2015-01-15 ENCOUNTER — Encounter: Payer: Self-pay | Admitting: Internal Medicine

## 2015-01-15 ENCOUNTER — Ambulatory Visit (INDEPENDENT_AMBULATORY_CARE_PROVIDER_SITE_OTHER): Payer: 59 | Admitting: Internal Medicine

## 2015-01-15 ENCOUNTER — Telehealth: Payer: Self-pay | Admitting: Internal Medicine

## 2015-01-15 VITALS — BP 116/82 | HR 79 | Temp 98.2°F | Ht 78.75 in | Wt 325.0 lb

## 2015-01-15 DIAGNOSIS — Z9889 Other specified postprocedural states: Secondary | ICD-10-CM

## 2015-01-15 DIAGNOSIS — Z09 Encounter for follow-up examination after completed treatment for conditions other than malignant neoplasm: Secondary | ICD-10-CM

## 2015-01-15 DIAGNOSIS — Z125 Encounter for screening for malignant neoplasm of prostate: Secondary | ICD-10-CM

## 2015-01-15 DIAGNOSIS — Z0000 Encounter for general adult medical examination without abnormal findings: Secondary | ICD-10-CM | POA: Diagnosis not present

## 2015-01-15 DIAGNOSIS — Z1159 Encounter for screening for other viral diseases: Secondary | ICD-10-CM

## 2015-01-15 DIAGNOSIS — Z114 Encounter for screening for human immunodeficiency virus [HIV]: Secondary | ICD-10-CM

## 2015-01-15 MED ORDER — SILDENAFIL CITRATE 20 MG PO TABS: 40.0000 mg | ORAL_TABLET | Freq: Every day | ORAL | Status: DC | PRN

## 2015-01-15 NOTE — Progress Notes (Signed)
Subjective:    Patient ID: Dennis Castro, male    DOB: 1960-08-01, 54 y.o.   MRN: [id number]  DOS:  01/15/2015 Type of visit - description : CPX, new pt to me , referred by Dr Alwyn Ren  Interval history: For the last 2 years has noted generalized fatigue, denies snoring. He has morbid obesity, he gained  weight started at age 7 and has been overweight since then. Having ankle pain mostly in the morning when he starts walking and occasionally bilateral ankle swelling at the end of the day. Long  history of occasional anal burning and red blood per rectum with bowel movements.    Review of Systems Constitutional: No fever. No chills. . No unusual sweats  HEENT: No dental problems, no ear discharge, no facial swelling, no voice changes. No eye discharge, no eye  redness , no  intolerance to light   Respiratory: No wheezing , no  difficulty breathing. No cough , no mucus production  Cardiovascular: Occasionally has left-sided chest pain, last one or 2 seconds, not exertional, no  Palpitations  GI: no nausea, no vomiting, no diarrhea , no  abdominal pain.   No dysphagia, no odynophagia    Endocrine: No polyphagia, no polyuria , no polydipsia  GU: No dysuria, gross hematuria, difficulty urinating. No urinary urgency, no frequency.  Musculoskeletal: See history of present illness Skin: No change in the color of the skin, palor , no  Rash  Allergic, immunologic: No environmental allergies , no  food allergies  Neurological: No dizziness no  syncope. No headaches. No diplopia, no slurred, no slurred speech, no motor deficits, no facial  Numbness  Hematological: No enlarged lymph nodes, no easy bruising , no unusual bleedings  Psychiatry: No suicidal ideas, no hallucinations, no beavior problems, no confusion.  No unusual/severe anxiety, no depression   Past Medical History  Diagnosis Date  . GERD (gastroesophageal reflux disease)   . Seasonal allergies   . Allergy   .  Arthritis   . Diverticulitis     Past Surgical History  Procedure Laterality Date  . Tonsillectomy      age 73  . Cardiac catheterization  2010    Dr. Allyson Sabal; less than 30% occlusive disease    Social History   Social History  . Marital Status: Married    Spouse Name: N/A  . Number of Children: 1  . Years of Education: N/A   Occupational History  . Production designer, theatre/television/film, office job    Social History Main Topics  . Smoking status: Never Smoker   . Smokeless tobacco: Never Used  . Alcohol Use: No  . Drug Use: No  . Sexual Activity: Not on file   Other Topics Concern  . Not on file   Social History Narrative   56 y/o son     Family History  Problem Relation Age of Onset  . Diabetes Mother   . CAD Mother 82    CBAG  . Cancer Father     prostate cancer and panceatic cancer  . Breast cancer Maternal Grandmother   . Stroke Neg Hx   . Colon cancer Neg Hx   . Pancreatic cancer Paternal Grandfather        Medication List       This list is accurate as of: 01/15/15 11:59 PM.  Always use your most recent med list.               loratadine 10 MG tablet  Commonly known  as:  CLARITIN  Take 10 mg by mouth daily as needed for allergies.     ranitidine 150 MG tablet  Commonly known as:  ZANTAC  Take 150 mg by mouth daily.     sildenafil 20 MG tablet  Commonly known as:  REVATIO  Take 2-3 tablets (40-60 mg total) by mouth daily as needed.           Objective:   Physical Exam BP 116/82 mmHg  Pulse 79  Temp(Src) 98.2 F (36.8 C) (Oral)  Ht 6' 6.75" (2 m)  Wt 325 lb (147.419 kg)  BMI 36.85 kg/m2  SpO2 97% General:   Well developed, well nourished . NAD.  Neck:  Full range of motion. Supple. No  thyromegaly , normal carotid pulse HEENT:  Normocephalic . Face symmetric, atraumatic Lungs:  CTA B Normal respiratory effort, no intercostal retractions, no accessory muscle use. Heart: RRR,  no murmur.  No pretibial edema bilaterally  Abdomen:  Not distended,  soft, non-tender. No rebound or rigidity. No mass,organomegaly DRE: No gross external abnormalities, prostate is normal in size, no nodular or tender Skin: Exposed areas without rash. Not pale. Not jaundice Neurologic:  alert & oriented X3.  Speech normal, gait appropriate for age and unassisted Strength symmetric and appropriate for age.  Psych: Cognition and judgment appear intact.  Cooperative with normal attention span and concentration.  Behavior appropriate. No anxious or depressed appearing.    Assessment & Plan:   Assessment > Hyperlipidemia before  GERD Obesity  CAD: Cath  2010 < 30% occlusive disease Diverticulitis Seasonal allergies +FH prostate ca (father age 84), CAD (mother age 68)  Plan Fatigue: Epworth Sleepiness Scale negative, no snoring. Likely multifactorial. I recommend to start a gradual exercise program   but before we do that, the patient likes to check his heart and see Dr. Allyson Sabal again ---> referral done  Obesity: offered a nutritionist referral Nonobstructive CAD per cath 2010: See above Ankle pain: Reassess in few months

## 2015-01-15 NOTE — Telephone Encounter (Signed)
Pt is requesting refill on Sildenafil. Last filled on 03/25/2012 #5 and 11 refills. Please advise.

## 2015-01-15 NOTE — Progress Notes (Signed)
Pre visit review using our clinic review tool, if applicable. No additional management support is needed unless otherwise documented below in the visit note. 

## 2015-01-15 NOTE — Patient Instructions (Signed)
  Please schedule labs to be done within few days (fasting)     Next visit  for a checkup in 4 months   (30 minutes) Please schedule an appointment at the front desk

## 2015-01-15 NOTE — Telephone Encounter (Signed)
Please send refill for sildenafil  To [facility].

## 2015-01-15 NOTE — Telephone Encounter (Signed)
Sildenafil 100 mg d/c, Sildenafil 20 mg 2-3 tablets daily PRN sent to [facility].

## 2015-01-15 NOTE — Telephone Encounter (Signed)
Sildenafil 20 mg 2 or 3 tablets daily as needed #30 , 5 RF

## 2015-01-17 DIAGNOSIS — Z09 Encounter for follow-up examination after completed treatment for conditions other than malignant neoplasm: Secondary | ICD-10-CM | POA: Insufficient documentation

## 2015-01-17 DIAGNOSIS — Z0000 Encounter for general adult medical examination without abnormal findings: Secondary | ICD-10-CM | POA: Insufficient documentation

## 2015-01-17 NOTE — Assessment & Plan Note (Signed)
Fatigue: Epworth Sleepiness Scale negative, no snoring. Likely multifactorial. I recommend to start a gradual exercise program   but before we do that, the patient likes to check his heart and see Dr. Allyson SabalBerry again ---> referral done  Obesity: offered a nutritionist referral Nonobstructive CAD per cath 2010: See above Ankle pain: Reassess in few months

## 2015-01-17 NOTE — Assessment & Plan Note (Signed)
Td  2016 Flu shot 2 weeks ago Colonoscopy-2014: Diverticuli, no polyps Prostate cancer  screening: DRE today normal, check a PSA Diet discussed !  exercise discussed CMP, FLP, A1c, CBC, TSH, PSA, HIV, hep C

## 2015-03-23 ENCOUNTER — Encounter: Payer: Self-pay | Admitting: Cardiovascular Disease

## 2015-03-23 ENCOUNTER — Ambulatory Visit (INDEPENDENT_AMBULATORY_CARE_PROVIDER_SITE_OTHER): Payer: 59 | Admitting: Cardiovascular Disease

## 2015-03-23 VITALS — BP 106/80 | HR 74 | Ht 77.0 in | Wt 333.0 lb

## 2015-03-23 DIAGNOSIS — E785 Hyperlipidemia, unspecified: Secondary | ICD-10-CM

## 2015-03-23 NOTE — Progress Notes (Signed)
03/23/2015 Dennis Castro   05-23-1960  [id number]  Primary Physician Willow Ora, MD Primary Cardiologist: Runell Gess MD [name]   HPI:  Mr. Dennis Castro is a 54 year old severely overweight married Caucasian male father of one  Who works at [employer] in [place]. I last saw him in the office 01/05/10. He was having atypical chest pain and had a Myoview stress test that showed subtle anterior ischemia. Based on this he underwent cardiac catheterization by myself 06/24/09 revealing noncritical CAD with at most a 30% hypodense segmental mid LAD stent stenosis with very subtle anteroapical hypokinesia. Medical therapy is recommended. He continued to have atypical chest pain. He does have a family history of heart disease with a mother who had stents in her 66s and bypass surgery at 69.   Current Outpatient Prescriptions  Medication Sig Dispense Refill  . loratadine (CLARITIN) 10 MG tablet Take 10 mg by mouth daily as needed for allergies.    . ranitidine (ZANTAC) 150 MG tablet Take 150 mg by mouth daily.    . sildenafil (REVATIO) 20 MG tablet Take 2-3 tablets (40-60 mg total) by mouth daily as needed. 30 tablet 5   No current facility-administered medications for this visit.    No Known Allergies  Social History   Social History  . Marital Status: Married    Spouse Name: N/A  . Number of Children: 1  . Years of Education: N/A   Occupational History  . Production designer, theatre/television/film, office job    Social History Main Topics  . Smoking status: Never Smoker   . Smokeless tobacco: Never Used  . Alcohol Use: No  . Drug Use: No  . Sexual Activity: Not on file   Other Topics Concern  . Not on file   Social History Narrative   83 y/o son     Review of Systems: General: negative for chills, fever, night sweats or weight changes.  Cardiovascular: negative for chest pain, dyspnea on exertion, edema, orthopnea, palpitations, paroxysmal nocturnal dyspnea or shortness of  breath Dermatological: negative for rash Respiratory: negative for cough or wheezing Urologic: negative for hematuria Abdominal: negative for nausea, vomiting, diarrhea, bright red blood per rectum, melena, or hematemesis Neurologic: negative for visual changes, syncope, or dizziness All other systems reviewed and are otherwise negative except as noted above.    Blood pressure 106/80, pulse 74, height 6\' 5"  (1.956 m), weight 333 lb (151.048 kg).  General appearance: alert and no distress Neck: no adenopathy, no carotid bruit, no JVD, supple, symmetrical, trachea midline and thyroid not enlarged, symmetric, no tenderness/mass/nodules Lungs: clear to auscultation bilaterally Heart: regular rate and rhythm, S1, S2 normal, no murmur, click, rub or gallop Extremities: extremities normal, atraumatic, no cyanosis or edema  EKG not performed today  ASSESSMENT AND PLAN:   HYPERLIPIDEMIA History of hyperlipidemia not on statin therapy. This is followed by his PCP      Runell Gess MD [facility], [facility] 03/23/2015 8:31 AM

## 2015-03-23 NOTE — Assessment & Plan Note (Signed)
History of hyperlipidemia not on statin therapy. This is followed by his PCP

## 2015-03-23 NOTE — Patient Instructions (Signed)
Your physician wants you to follow-up in: ONE YEAR WITH DR BERRY You will receive a reminder letter in the mail two months in advance. If you don't receive a letter, please call our office to schedule the follow-up appointment.  

## 2015-05-17 ENCOUNTER — Telehealth: Payer: Self-pay | Admitting: Internal Medicine

## 2015-05-17 NOTE — Telephone Encounter (Signed)
Pt needs late day or early morning appt. He had to RS from 2/15. I moved him to 3/28 4pm (he needs 30 min though). Please advise if ok.

## 2015-05-17 NOTE — Telephone Encounter (Signed)
Ok

## 2015-05-19 ENCOUNTER — Ambulatory Visit: Payer: 59 | Admitting: Internal Medicine

## 2015-06-29 ENCOUNTER — Ambulatory Visit: Payer: 59 | Admitting: Internal Medicine

## 2015-07-19 ENCOUNTER — Encounter: Payer: Self-pay | Admitting: Internal Medicine

## 2015-07-19 ENCOUNTER — Ambulatory Visit (INDEPENDENT_AMBULATORY_CARE_PROVIDER_SITE_OTHER): Payer: 59 | Admitting: Internal Medicine

## 2015-07-19 VITALS — BP 102/68 | HR 65 | Temp 98.3°F | Ht 77.0 in | Wt 314.5 lb

## 2015-07-19 DIAGNOSIS — Z0000 Encounter for general adult medical examination without abnormal findings: Secondary | ICD-10-CM

## 2015-07-19 DIAGNOSIS — Z114 Encounter for screening for human immunodeficiency virus [HIV]: Secondary | ICD-10-CM

## 2015-07-19 DIAGNOSIS — E669 Obesity, unspecified: Secondary | ICD-10-CM

## 2015-07-19 DIAGNOSIS — Z125 Encounter for screening for malignant neoplasm of prostate: Secondary | ICD-10-CM | POA: Diagnosis not present

## 2015-07-19 DIAGNOSIS — R5383 Other fatigue: Secondary | ICD-10-CM

## 2015-07-19 DIAGNOSIS — Z09 Encounter for follow-up examination after completed treatment for conditions other than malignant neoplasm: Secondary | ICD-10-CM

## 2015-07-19 LAB — HIV ANTIBODY (ROUTINE TESTING W REFLEX): HIV: NONREACTIVE

## 2015-07-19 NOTE — Progress Notes (Signed)
Subjective:    Patient ID: Dennis Castro, male    DOB: Feb 18, 1961, 55 y.o.   MRN: [id number]  DOS:  07/19/2015 Type of visit - description : Follow-up Interval history: Since her last office visit he is feeling well. About 6 weeks ago he started a walking program, walking around 30-60 minutes 5 times a week, he is tracking his calories, has lost 20 pounds so far and feels great. Saw cardiology, note reviewed, was felt to be stable   Review of Systems Denies chest pain or difficulty breathing No nausea, vomiting, diarrhea  Past Medical History  Diagnosis Date  . GERD (gastroesophageal reflux disease)   . Seasonal allergies   . Allergy   . Arthritis   . Diverticulitis   . Hyperlipidemia   . Family history of heart disease   . Atypical chest pain     Past Surgical History  Procedure Laterality Date  . Tonsillectomy      age 80  . Cardiac catheterization  2010    Dr. Allyson Sabal; less than 30% occlusive disease    Social History   Social History  . Marital Status: Married    Spouse Name: N/A  . Number of Children: 1  . Years of Education: N/A   Occupational History  . Production designer, theatre/television/film, office job    Social History Main Topics  . Smoking status: Never Smoker   . Smokeless tobacco: Never Used  . Alcohol Use: No  . Drug Use: No  . Sexual Activity: Not on file   Other Topics Concern  . Not on file   Social History Narrative   41 y/o son        Medication List       This list is accurate as of: 07/19/15 11:59 PM.  Always use your most recent med list.               loratadine 10 MG tablet  Commonly known as:  CLARITIN  Take 10 mg by mouth daily as needed for allergies.     ranitidine 150 MG tablet  Commonly known as:  ZANTAC  Take 150 mg by mouth daily. Reported on 07/19/2015     sildenafil 20 MG tablet  Commonly known as:  REVATIO  Take 2-3 tablets (40-60 mg total) by mouth daily as needed.           Objective:   Physical Exam BP 102/68 mmHg  Pulse 65   Temp(Src) 98.3 F (36.8 C) (Oral)  Ht 6\' 5"  (1.956 m)  Wt 314 lb 8 oz (142.656 kg)  BMI 37.29 kg/m2  SpO2 96% General:   Well developed, well nourished . NAD.  HEENT:  Normocephalic . Face symmetric, atraumatic Lungs:  CTA B Normal respiratory effort, no intercostal retractions, no accessory muscle use. Heart: RRR,  no murmur.  No pretibial edema bilaterally  Skin: Not pale. Not jaundice Neurologic:  alert & oriented X3.  Speech normal, gait appropriate for age and unassisted Psych--  Cognition and judgment appear intact.  Cooperative with normal attention span and concentration.  Behavior appropriate. No anxious or depressed appearing.      Assessment & Plan:   Assessment > Hyperlipidemia before  GERD Obesity  CAD: Cath  2010 < 30% occlusive disease Diverticulitis Seasonal allergies +FH prostate ca (father age 87), CAD (mother age 67)  Plan: Fatigue: Feeling better Obesity: Doing great with diet and exercise,praised! We will get all the labs prescribed at the physical exam 6 months ago, further  advise with results. RTC 6 months

## 2015-07-19 NOTE — Progress Notes (Signed)
Pre visit review using our clinic review tool, if applicable. No additional management support is needed unless otherwise documented below in the visit note. 

## 2015-07-19 NOTE — Patient Instructions (Signed)
   GO TO THE FRONT DESK Schedule your next appointment for a  Physical exam, fasting in 6 months   

## 2015-07-20 LAB — COMPREHENSIVE METABOLIC PANEL
ALBUMIN: 3.9 g/dL (ref 3.5–5.2)
ALK PHOS: 67 U/L (ref 39–117)
ALT: 28 U/L (ref 0–53)
AST: 17 U/L (ref 0–37)
BILIRUBIN TOTAL: 0.4 mg/dL (ref 0.2–1.2)
BUN: 14 mg/dL (ref 6–23)
CALCIUM: 9.3 mg/dL (ref 8.4–10.5)
CO2: 29 mEq/L (ref 19–32)
CREATININE: 0.99 mg/dL (ref 0.40–1.50)
Chloride: 105 mEq/L (ref 96–112)
GFR: 83.77 mL/min (ref >60.00)
Glucose, Bld: 104 mg/dL — ABNORMAL HIGH (ref 70–99)
Potassium: 3.8 mEq/L (ref 3.5–5.1)
Sodium: 141 mEq/L (ref 135–145)
TOTAL PROTEIN: 6.2 g/dL (ref 6.0–8.3)

## 2015-07-20 LAB — CBC WITH DIFFERENTIAL/PLATELET
BASOS PCT: 0.7 % (ref 0.0–3.0)
Basophils Absolute: 0.1 10*3/uL (ref 0.0–0.1)
EOS PCT: 3 % (ref 0.0–5.0)
Eosinophils Absolute: 0.3 10*3/uL (ref 0.0–0.7)
HEMATOCRIT: 40.8 % (ref 39.0–52.0)
HEMOGLOBIN: 13.4 g/dL (ref 13.0–17.0)
LYMPHS PCT: 18.6 % (ref 12.0–46.0)
Lymphs Abs: 1.7 10*3/uL (ref 0.7–4.0)
MCHC: 32.9 g/dL (ref 30.0–36.0)
MCV: 85.6 fl (ref 78.0–100.0)
MONOS PCT: 6.7 % (ref 3.0–12.0)
Monocytes Absolute: 0.6 10*3/uL (ref 0.1–1.0)
NEUTROS ABS: 6.5 10*3/uL (ref 1.4–7.7)
Neutrophils Relative %: 71 % (ref 43.0–77.0)
PLATELETS: 256 10*3/uL (ref 150.0–400.0)
RBC: 4.78 Mil/uL (ref 4.22–5.81)
RDW: 13.9 % (ref 11.5–15.5)
WBC: 9.1 10*3/uL (ref 4.0–10.5)

## 2015-07-20 LAB — LIPID PANEL
CHOLESTEROL: 137 mg/dL (ref 0–200)
HDL: 32.8 mg/dL — ABNORMAL LOW (ref >39.00)
LDL CALC: 77 mg/dL (ref 0–99)
NonHDL: 104.08
TRIGLYCERIDES: 136 mg/dL (ref 0.0–149.0)
Total CHOL/HDL Ratio: 4
VLDL: 27.2 mg/dL (ref 0.0–40.0)

## 2015-07-20 LAB — PSA: PSA: 0.66 ng/mL (ref 0.10–4.00)

## 2015-07-20 LAB — HEMOGLOBIN A1C: Hgb A1c MFr Bld: 5.5 % (ref 4.6–6.5)

## 2015-07-20 LAB — TSH: TSH: 1.14 u[IU]/mL (ref 0.35–4.50)

## 2015-07-20 NOTE — Assessment & Plan Note (Signed)
Fatigue: Feeling better Obesity: Doing great with diet and exercise,praised! We will get all the labs prescribed at the physical exam 6 months ago, further advise with results. RTC 6 months

## 2015-09-02 DIAGNOSIS — K8020 Calculus of gallbladder without cholecystitis without obstruction: Secondary | ICD-10-CM

## 2015-09-02 HISTORY — DX: Calculus of gallbladder without cholecystitis without obstruction: K80.20

## 2015-09-20 ENCOUNTER — Ambulatory Visit (INDEPENDENT_AMBULATORY_CARE_PROVIDER_SITE_OTHER): Payer: 59 | Admitting: Family

## 2015-09-20 ENCOUNTER — Emergency Department (HOSPITAL_BASED_OUTPATIENT_CLINIC_OR_DEPARTMENT_OTHER): Payer: 59

## 2015-09-20 ENCOUNTER — Encounter: Payer: Self-pay | Admitting: Family

## 2015-09-20 ENCOUNTER — Encounter (HOSPITAL_BASED_OUTPATIENT_CLINIC_OR_DEPARTMENT_OTHER): Payer: Self-pay | Admitting: *Deleted

## 2015-09-20 ENCOUNTER — Emergency Department (HOSPITAL_BASED_OUTPATIENT_CLINIC_OR_DEPARTMENT_OTHER)
Admission: EM | Admit: 2015-09-20 | Discharge: 2015-09-20 | Disposition: A | Discharge status: Home / Self Care | Payer: 59 | Attending: Emergency Medicine | Admitting: Emergency Medicine

## 2015-09-20 VITALS — BP 112/72 | HR 107 | Temp 103.1°F | Resp 20 | Ht 77.0 in | Wt 317.6 lb

## 2015-09-20 DIAGNOSIS — R509 Fever, unspecified: Secondary | ICD-10-CM | POA: Diagnosis not present

## 2015-09-20 DIAGNOSIS — B349 Viral infection, unspecified: Secondary | ICD-10-CM | POA: Diagnosis not present

## 2015-09-20 DIAGNOSIS — R251 Tremor, unspecified: Secondary | ICD-10-CM | POA: Diagnosis not present

## 2015-09-20 DIAGNOSIS — R195 Other fecal abnormalities: Secondary | ICD-10-CM | POA: Diagnosis not present

## 2015-09-20 DIAGNOSIS — M1990 Unspecified osteoarthritis, unspecified site: Secondary | ICD-10-CM | POA: Diagnosis not present

## 2015-09-20 DIAGNOSIS — M791 Myalgia: Secondary | ICD-10-CM | POA: Insufficient documentation

## 2015-09-20 DIAGNOSIS — E785 Hyperlipidemia, unspecified: Secondary | ICD-10-CM | POA: Diagnosis not present

## 2015-09-20 DIAGNOSIS — K625 Hemorrhage of anus and rectum: Secondary | ICD-10-CM

## 2015-09-20 LAB — POC INFLUENZA A&B (BINAX/QUICKVUE)
INFLUENZA A, POC: NEGATIVE
INFLUENZA B, POC: NEGATIVE

## 2015-09-20 LAB — CBC WITH DIFFERENTIAL/PLATELET
BASOS PCT: 0 %
Basophils Absolute: 0 10*3/uL (ref 0.0–0.1)
EOS ABS: 0 10*3/uL (ref 0.0–0.7)
EOS PCT: 0 %
HCT: 42 % (ref 39.0–52.0)
HEMOGLOBIN: 14.2 g/dL (ref 13.0–17.0)
Lymphocytes Relative: 2 %
Lymphs Abs: 0.2 10*3/uL — ABNORMAL LOW (ref 0.7–4.0)
MCH: 28.6 pg (ref 26.0–34.0)
MCHC: 33.8 g/dL (ref 30.0–36.0)
MCV: 84.7 fL (ref 78.0–100.0)
Monocytes Absolute: 0.6 10*3/uL (ref 0.1–1.0)
Monocytes Relative: 6 %
NEUTROS PCT: 92 %
Neutro Abs: 10.7 10*3/uL — ABNORMAL HIGH (ref 1.7–7.7)
PLATELETS: 198 10*3/uL (ref 150–400)
RBC: 4.96 MIL/uL (ref 4.22–5.81)
RDW: 13.1 % (ref 11.5–15.5)
WBC: 11.6 10*3/uL — AB (ref 4.0–10.5)

## 2015-09-20 LAB — COMPREHENSIVE METABOLIC PANEL
ALK PHOS: 65 U/L (ref 38–126)
ALT: 39 U/L (ref 17–63)
ANION GAP: 8 (ref 5–15)
AST: 28 U/L (ref 15–41)
Albumin: 3.8 g/dL (ref 3.5–5.0)
BILIRUBIN TOTAL: 0.9 mg/dL (ref 0.3–1.2)
BUN: 20 mg/dL (ref 6–20)
CALCIUM: 8.9 mg/dL (ref 8.9–10.3)
CO2: 25 mmol/L (ref 22–32)
CREATININE: 1.07 mg/dL (ref 0.61–1.24)
Chloride: 102 mmol/L (ref 101–111)
Glucose, Bld: 116 mg/dL — ABNORMAL HIGH (ref 65–99)
Potassium: 3.8 mmol/L (ref 3.5–5.1)
SODIUM: 135 mmol/L (ref 135–145)
TOTAL PROTEIN: 6.8 g/dL (ref 6.5–8.1)

## 2015-09-20 LAB — POCT RAPID STREP A (OFFICE): Rapid Strep A Screen: NEGATIVE

## 2015-09-20 LAB — URINALYSIS, ROUTINE W REFLEX MICROSCOPIC
Glucose, UA: NEGATIVE mg/dL
Hgb urine dipstick: NEGATIVE
KETONES UR: 15 mg/dL — AB
LEUKOCYTES UA: NEGATIVE
NITRITE: NEGATIVE
PH: 6 (ref 5.0–8.0)
PROTEIN: NEGATIVE mg/dL
Specific Gravity, Urine: 1.031 — ABNORMAL HIGH (ref 1.005–1.030)

## 2015-09-20 MED ORDER — ONDANSETRON HCL 4 MG/2ML IJ SOLN
4.0000 mg | Freq: Once | INTRAMUSCULAR | Status: AC
  Administered 2015-09-20: 4 mg via INTRAVENOUS
  Filled 2015-09-20: qty 2

## 2015-09-20 MED ORDER — ACETAMINOPHEN 325 MG PO TABS
650.0000 mg | ORAL_TABLET | Freq: Once | ORAL | Status: AC
  Administered 2015-09-20: 650 mg via ORAL
  Filled 2015-09-20: qty 2

## 2015-09-20 MED ORDER — IOPAMIDOL (ISOVUE-300) INJECTION 61%
100.0000 mL | Freq: Once | INTRAVENOUS | Status: AC | PRN
  Administered 2015-09-20: 100 mL via INTRAVENOUS

## 2015-09-20 MED ORDER — SODIUM CHLORIDE 0.9 % IV BOLUS (SEPSIS): 500.0000 mL | Freq: Once | INTRAVENOUS | Status: DC

## 2015-09-20 NOTE — ED Notes (Signed)
Pt's wife and son are here.

## 2015-09-20 NOTE — ED Provider Notes (Signed)
CSN: [id number]     Arrival date & time 09/20/15  1902 History  By signing my name below, I, Dennis Castro, attest that this documentation has been prepared under the direction and in the presence of Dennis Pulley, MD . Electronically Signed: Marisue Castro, Scribe. 09/20/2015. 7:58 PM.   Chief Complaint  Patient presents with  . Fever   The history is provided by the patient. No language interpreter was used.   HPI Comments:  Dennis Castro is a 55 y.o. male with PMHx of HLD and diverticulosis who presents to the Emergency Department complaining of intermittent fever tmax 102 for the past 4 days. Pt reports associated sudden onset weakness, chills and tremors while at work 4 days ago, and repeated episodes of the same since. He also reports bloody stools, nausea, stiffness between his shoulder blades, intermittent generalized myalgias, increased urinary frequency, darker urine and smaller amounts of urine. He has taken Tylenol with temporary relief. Denies painful urination or h/o UTI.   Past Medical History  Diagnosis Date  . GERD (gastroesophageal reflux disease)   . Seasonal allergies   . Allergy   . Arthritis   . Diverticulitis   . Hyperlipidemia   . Family history of heart disease   . Atypical chest pain    Past Surgical History  Procedure Laterality Date  . Tonsillectomy      age 66  . Cardiac catheterization  2010    Dr. Allyson Sabal; less than 30% occlusive disease   Family History  Problem Relation Age of Onset  . Diabetes Mother   . CAD Mother 41    CBAG  . Cancer Father     prostate cancer and panceatic cancer  . Breast cancer Maternal Grandmother   . Stroke Neg Hx   . Colon cancer Neg Hx   . Pancreatic cancer Paternal Grandfather    Social History  Substance Use Topics  . Smoking status: Never Smoker   . Smokeless tobacco: Never Used  . Alcohol Use: No    Review of Systems  Constitutional: Positive for fever and chills.  Gastrointestinal: Positive for  nausea and blood in stool.  Genitourinary: Positive for frequency and decreased urine volume. Negative for dysuria.  Musculoskeletal: Positive for myalgias and back pain.  Neurological: Positive for tremors.  All other systems reviewed and are negative.   Allergies  Review of patient's allergies indicates no known allergies.  Home Medications   Prior to Admission medications   Medication Sig Start Date End Date Taking? Authorizing Provider  loratadine (CLARITIN) 10 MG tablet Take 10 mg by mouth daily as needed for allergies.    Historical Provider, MD  ranitidine (ZANTAC) 150 MG tablet Take 150 mg by mouth daily. Reported on 07/19/2015    Historical Provider, MD  sildenafil (REVATIO) 20 MG tablet Take 2-3 tablets (40-60 mg total) by mouth daily as needed. Patient not taking: Reported on 09/20/2015 01/15/15   Wanda Plump, MD   BP 91/61 mmHg  Pulse 8  Temp(Src) 99.9 F (37.7 C) (Oral)  Resp 16  Ht  (1.956 m)  Wt 320 lb (145.151 kg)  BMI 37.94 kg/m2  SpO2 93%   Physical Exam  Constitutional: He is oriented to person, place, and time. He appears well-developed and well-nourished. No distress.  HENT:  Head: Normocephalic and atraumatic.  Eyes: Conjunctivae are normal.  Neck: Neck supple. No tracheal deviation present.  No meningismus, no neck stiffness   Cardiovascular: Normal rate and regular rhythm.  Pulmonary/Chest: Effort normal. No respiratory distress.  Abdominal: Soft. He exhibits no distension. There is no tenderness. There is no rebound and no guarding.  Musculoskeletal:  Right paraspinal muscle TTP and right trapezius TTP; thoracic paraspinal muscle tenderness   Neurological: He is alert and oriented to person, place, and time.  Skin: Skin is warm and dry. No rash noted.  Psychiatric: He has a normal mood and affect.    ED Course  Procedures  DIAGNOSTIC STUDIES:  Oxygen Saturation is 96% on RA, normal by my interpretation.    COORDINATION OF CARE:  7:54  PM Will order CBC, CMP and UA. Discussed treatment plan with pt at bedside and pt agreed to plan.  Labs Review Labs Reviewed  CBC WITH DIFFERENTIAL/PLATELET - Abnormal; Notable for the following:    WBC 11.6 (*)    Neutro Abs 10.7 (*)    Lymphs Abs 0.2 (*)    All other components within normal limits  COMPREHENSIVE METABOLIC PANEL - Abnormal; Notable for the following:    Glucose, Bld 116 (*)    All other components within normal limits  URINALYSIS, ROUTINE W REFLEX MICROSCOPIC (NOT AT [facility]) - Abnormal; Notable for the following:    Color, Urine AMBER (*)    Specific Gravity, Urine 1.031 (*)    Bilirubin Urine SMALL (*)    Ketones, ur 15 (*)    All other components within normal limits    Imaging Review Ct Abdomen Pelvis W Contrast  09/20/2015  CLINICAL DATA:  Chills, aches, nausea, cough, weakness, blood in stools, RIGHT neck stiffness intermittent for 4 days. Assess for diverticulitis. EXAM: CT ABDOMEN AND PELVIS WITH CONTRAST TECHNIQUE: Multidetector CT imaging of the abdomen and pelvis was performed using the standard protocol following bolus administration of intravenous contrast. CONTRAST:  100mL ISOVUE-300 IOPAMIDOL (ISOVUE-300) INJECTION 61% COMPARISON:  CT abdomen and pelvis July 19, 2005 FINDINGS: LUNG BASES: Included view of the lung bases are clear. Visualized heart and pericardium are unremarkable. SOLID ORGANS: The liver, spleen, pancreas and adrenal glands are unremarkable. Tiny layering gallstones without CT findings of acute cholecystitis. GASTROINTESTINAL TRACT: Very small hiatal hernia. The stomach, small and large bowel are normal in course and caliber without inflammatory changes. Similar small bowel intramural which can be seen with chronic inflammation without acute component. Mild descending and sigmoid diverticulosis. Normal appendix. KIDNEYS/ URINARY TRACT: Kidneys are orthotopic, demonstrating symmetric enhancement. 15 millimeter cyst upper pole LEFT kidney, 14  millimeter LEFT lower pole cyst. Malrotated LEFT kidney again noted. Stable 6 millimeter LEFT upper pole nephrolithiasis. Punctate LEFT interpolar nephrolithiasis. No nephrolithiasis, hydronephrosis or solid renal masses. The unopacified ureters are normal in course and caliber. Delayed imaging through the kidneys demonstrates symmetric prompt contrast excretion within the proximal urinary collecting system. Urinary bladder is partially distended and unremarkable. PERITONEUM/RETROPERITONEUM: Aortoiliac vessels are normal in course and caliber, mild calcific atherosclerosis. No lymphadenopathy by CT size criteria. Prostate is mildly enlarged. No intraperitoneal free fluid nor free air. SOFT TISSUE/OSSEOUS STRUCTURES: Non-suspicious. Large fat containing RIGHT inguinal hernia. Moderate fat containing umbilical hernia. IMPRESSION: Cholelithiasis and mild colonic diverticulosis without CT findings of acute intra-abdominal or pelvic process. Stable nonobstructing 6 millimeter RIGHT upper pole nephrolithiasis. Punctate LEFT interpolar nephrolithiasis. Electronically Signed   By: Awilda Metroourtnay  Bloomer M.D.   On: 09/20/2015 22:05   I have personally reviewed and evaluated these images and lab results as part of my medical decision-making.   EKG Interpretation None      MDM   Final diagnoses:  Fever  in adult  Viral illness    55 y.o. male presents with Intermittent fevers, body aches, chills over the last 4 days that have been resolving during the day. Went to primary care today, had negative strep and flu screen was sent here for further evaluation because of some bright red blood per rectum. Patient does have a history of diverticulosis but has a hemoglobin of 14 and no clinically significant bleeding. Fever rectal bleeding prompted evaluation for diverticulitis given his history of diverticulosis on colonoscopy. CT was negative for any acute findings. Incidental finding of cholelithiasis without signs of  cholecystitis and no typical symptoms currently. No elevation in LFTs or bili to suggest that this is the etiology to explain his fevers. He has no typical signs of tick borne illness or meningitis on his clinical evaluation today and his fever was easily controlled with Tylenol. Urinalysis shows signs of mild dehydration without infection and mild neutrophilic leukocytosis would be consistent with a viral illness of unspecified etiology. I recommended close follow-up with monitoring of his symptoms over the next few days which will hopefully resolve. If he were to develop right upper quadrant pain, vomiting, persistent fever or increasing bleeding he should come back for a reevaluation. His blood pressure has been stable throughout his emergency department course and the patient has an oversized cuff which might be contributing. He is not tachycardic and he has no other signs of developing sepsis. Plan to follow up with PCP as needed and return precautions discussed for worsening or new concerning symptoms.   I personally performed the services described in this documentation, which was scribed in my presence. The recorded information has been reviewed and is accurate.     Dennis Pulley, MD 09/20/15 [phone]

## 2015-09-20 NOTE — Progress Notes (Signed)
Pre visit review using our clinic review tool, if applicable. No additional management support is needed unless otherwise documented below in the visit note. 

## 2015-09-20 NOTE — Addendum Note (Signed)
Addended by: Mervin KungFERGERSON, Robbie Nangle A on: 09/20/2015 07:38 PM   Modules accepted: Orders

## 2015-09-20 NOTE — Progress Notes (Signed)
Subjective:    Patient ID: Dennis Castro, male    DOB: 1961-01-18, 55 y.o.   MRN: [id number]  HPI    Dennis Castro is a 55 yr old male who presents today with several concerns.   Cough- began shaking uncontrollably on Thursday while at a meeting at 4:30 pm.  Developed fever of 102- "felt bad."  Went home took a tylenol, went to bed.  Woke up at 11 pm, fever was reportedly down. Went to work on Friday AM. At 10 AM began shaking again despite it being very hot outside. Went home felt fine over the weekend until this AM and then this AM "everything came back" began last Thursday, temp 102. Took 650mg  tylenol at 2:30PM.   Reports + "bad" nausea.  Also reports + pain right lower neck and upper back stiffness.  Developed generalized aching/weakness. He reports mild abdominal pain, "like I am bloated."  BM's have been regular but "since Thursday he has had some blood in each stool. Notes + hemorrhoid issues.  This AM toilet bowl was "cherry red" with blood.    Review of Systems See HPI  Past Medical History  Diagnosis Date  . GERD (gastroesophageal reflux disease)   . Seasonal allergies   . Allergy   . Arthritis   . Diverticulitis   . Hyperlipidemia   . Family history of heart disease   . Atypical chest pain      Social History   Social History  . Marital Status: Married    Spouse Name: N/A  . Number of Children: 1  . Years of Education: N/A   Occupational History  . Production designer, theatre/television/film, office job    Social History Main Topics  . Smoking status: Never Smoker   . Smokeless tobacco: Never Used  . Alcohol Use: No  . Drug Use: No  . Sexual Activity: Not on file   Other Topics Concern  . Not on file   Social History Narrative   25 y/o son    Past Surgical History  Procedure Laterality Date  . Tonsillectomy      age 40  . Cardiac catheterization  2010    Dr. Allyson Sabal; less than 30% occlusive disease    Family History  Problem Relation Age of Onset  . Diabetes Mother   . CAD Mother  67    CBAG  . Cancer Father     prostate cancer and panceatic cancer  . Breast cancer Maternal Grandmother   . Stroke Neg Hx   . Colon cancer Neg Hx   . Pancreatic cancer Paternal Grandfather     No Known Allergies  Current Outpatient Prescriptions on File Prior to Visit  Medication Sig Dispense Refill  . loratadine (CLARITIN) 10 MG tablet Take 10 mg by mouth daily as needed for allergies.    . ranitidine (ZANTAC) 150 MG tablet Take 150 mg by mouth daily. Reported on 07/19/2015    . sildenafil (REVATIO) 20 MG tablet Take 2-3 tablets (40-60 mg total) by mouth daily as needed. (Patient not taking: Reported on 09/20/2015) 30 tablet 5   No current facility-administered medications on file prior to visit.    BP 112/72 mmHg  Pulse 107  Temp(Src) 103.1 F (39.5 C) (Oral)  Resp 20  Ht 6\' 5"  (1.956 m)  Wt 317 lb 9.6 oz (144.062 kg)  BMI 37.65 kg/m2  SpO2 99%       Objective:   Physical Exam  Constitutional: He is oriented to person, place,  and time.  Ill appearing, diaphoretic male  HENT:  Head: Normocephalic and atraumatic.  Eyes: No scleral icterus.  Cardiovascular: Normal heart sounds.  Tachycardia present.   Abdominal:  Abdomen is distended without significant tenderness to palpation. No guarding, no rebound  Genitourinary:  Normal rectal tone, no rectal masses noted on exam, frank red blood noted on rectal exam  Musculoskeletal: He exhibits no edema.  + neck pain with flexion  Neurological: He is alert and oriented to person, place, and time.  Skin: Skin is warm. He is diaphoretic.          Assessment & Plan:  Fever- Temp is 103.1.  Did check a rapid flu swab and rapid strep both were negative.  Etiology unclear- differential includes colitis/diverticulitis, meningitis, acute viral illness.    Rectal Bleeding- doubt hemorrhoidal cause.  ? Secondary to colitis. Will refer pt to ED for further evaluation.  Report given to Dr. Clydene Pugh ER physician on duty at the [facility] ED.

## 2015-09-20 NOTE — ED Notes (Signed)
C/o chills, aches, nausea cough, weakness blood in stools and rt side of neck still  Onset Thursday into Friday,  Saturday and Sunday felt fine then all sx returned today  Was seen upstairs for same and was sent down here for additional testing

## 2015-09-20 NOTE — Patient Instructions (Signed)
Please proceed to the ER for further evaluation

## 2015-09-20 NOTE — Discharge Instructions (Signed)
Fever, Adult A fever is an increase in the body's temperature. It is usually defined as a temperature of 100F (38C) or higher. Brief mild or moderate fevers generally have no long-term effects, and they often do not require treatment. Moderate or high fevers may make you feel uncomfortable and can sometimes be a sign of a serious illness or disease. The sweating that may occur with repeated or prolonged fever may also cause dehydration. Fever is confirmed by taking a temperature with a thermometer. A measured temperature can vary with:  Age.  Time of day.  Location of the thermometer:  Mouth (oral).  Rectum (rectal).  Ear (tympanic).  Underarm (axillary).  Forehead (temporal). HOME CARE INSTRUCTIONS Pay attention to any changes in your symptoms. Take these actions to help with your condition:  Take over-the counter and prescription medicines only as told by your health care provider. Follow the dosing instructions carefully.  If you were prescribed an antibiotic medicine, take it as told by your health care provider. Do not stop taking the antibiotic even if you start to feel better.  Rest as needed.  Drink enough fluid to keep your urine clear or pale yellow. This helps to prevent dehydration.  Sponge yourself or bathe with room-temperature water to help reduce your body temperature as needed. Do not use ice water.  Do not overbundle yourself in blankets or heavy clothes. SEEK MEDICAL CARE IF:  You vomit.  You cannot eat or drink without vomiting.  You have diarrhea.  You have pain when you urinate.  Your symptoms do not improve with treatment.  You develop new symptoms.  You develop excessive weakness. SEEK IMMEDIATE MEDICAL CARE IF:  You have shortness of breath or have trouble breathing.  You are dizzy or you faint.  You are disoriented or confused.  You develop signs of dehydration, such as a dry mouth, decreased urination, or paleness.  You develop  severe pain in your abdomen.  You have persistent vomiting or diarrhea.  You develop a skin rash.  Your symptoms suddenly get worse.   This information is not intended to replace advice given to you by your health care provider. Make sure you discuss any questions you have with your health care provider.   Document Released: 09/13/2000 Document Revised: 12/09/2014 Document Reviewed: 05/14/2014 Elsevier Interactive Patient Education 2016 [employer]  

## 2015-09-20 NOTE — ED Notes (Signed)
Fever x 4 days. Bodyaches, chills, blood in his stools, dry cough. He was seen by Sandford CrazeMelissa O'Sullivan this evening for weakness. Sent here after strep and flu test was negative.

## 2015-09-21 ENCOUNTER — Encounter: Payer: Self-pay | Admitting: Gastroenterology

## 2015-09-21 ENCOUNTER — Ambulatory Visit (HOSPITAL_BASED_OUTPATIENT_CLINIC_OR_DEPARTMENT_OTHER)
Admission: RE | Admit: 2015-09-21 | Discharge: 2015-09-21 | Disposition: A | Discharge status: Home / Self Care | Payer: 59 | Source: Ambulatory Visit | Attending: Internal Medicine | Admitting: Internal Medicine

## 2015-09-21 ENCOUNTER — Other Ambulatory Visit: Payer: Self-pay | Admitting: Internal Medicine

## 2015-09-21 ENCOUNTER — Encounter: Payer: Self-pay | Admitting: Internal Medicine

## 2015-09-21 ENCOUNTER — Telehealth: Payer: Self-pay | Admitting: Internal Medicine

## 2015-09-21 ENCOUNTER — Ambulatory Visit (INDEPENDENT_AMBULATORY_CARE_PROVIDER_SITE_OTHER): Payer: 59 | Admitting: Internal Medicine

## 2015-09-21 VITALS — BP 118/66 | HR 65 | Temp 97.7°F | Ht 77.0 in | Wt 317.6 lb

## 2015-09-21 DIAGNOSIS — R509 Fever, unspecified: Secondary | ICD-10-CM | POA: Insufficient documentation

## 2015-09-21 DIAGNOSIS — K625 Hemorrhage of anus and rectum: Secondary | ICD-10-CM | POA: Diagnosis not present

## 2015-09-21 LAB — PSA: PSA: 0.56 ng/mL (ref 0.10–4.00)

## 2015-09-21 LAB — URINALYSIS, ROUTINE W REFLEX MICROSCOPIC
Leukocytes, UA: NEGATIVE
Nitrite: NEGATIVE
Specific Gravity, Urine: 1.015 (ref 1.000–1.030)
Urine Glucose: NEGATIVE
Urobilinogen, UA: 1 (ref 0.0–1.0)
pH: 6 (ref 5.0–8.0)

## 2015-09-21 LAB — CBC WITH DIFFERENTIAL/PLATELET
BASOS PCT: 0.4 % (ref 0.0–3.0)
Basophils Absolute: 0 10*3/uL (ref 0.0–0.1)
EOS ABS: 0 10*3/uL (ref 0.0–0.7)
Eosinophils Relative: 0.6 % (ref 0.0–5.0)
HEMATOCRIT: 42.1 % (ref 39.0–52.0)
Hemoglobin: 13.9 g/dL (ref 13.0–17.0)
LYMPHS ABS: 0.4 10*3/uL — AB (ref 0.7–4.0)
LYMPHS PCT: 8.9 % — AB (ref 12.0–46.0)
MCHC: 33 g/dL (ref 30.0–36.0)
MCV: 84.5 fl (ref 78.0–100.0)
Monocytes Absolute: 0.5 10*3/uL (ref 0.1–1.0)
Monocytes Relative: 9.8 % (ref 3.0–12.0)
NEUTROS ABS: 4 10*3/uL (ref 1.4–7.7)
Neutrophils Relative %: 80.3 % — ABNORMAL HIGH (ref 43.0–77.0)
PLATELETS: 175 10*3/uL (ref 150.0–400.0)
RBC: 4.98 Mil/uL (ref 4.22–5.81)
RDW: 13.4 % (ref 11.5–15.5)
WBC: 4.9 10*3/uL (ref 4.0–10.5)

## 2015-09-21 LAB — HEMOCCULT GUIAC POC 1CARD (OFFICE): FECAL OCCULT BLD: POSITIVE — AB

## 2015-09-21 NOTE — Progress Notes (Signed)
Pre visit review using our clinic review tool, if applicable. No additional management support is needed unless otherwise documented below in the visit note. 

## 2015-09-21 NOTE — Telephone Encounter (Signed)
Was seen yesterday here and at the ER. Had fever, nausea, aches last week . Symptoms resurface yesterday, symptoms and were quite intense. Went to the ER, workup was done and he was released home. Today he reports that he is feeling slightly better but is still weak. Some subjective fever, has not checked his temperature, taking ibuprofen Mild headache. No rash, nose bleeding or gum bleeding. Still has seen some blood per rectum. No abdominal pain to speak of. I recommend to come back to the office for a recheck today at 1pm. He agrees.

## 2015-09-21 NOTE — Progress Notes (Signed)
Subjective:    Patient ID: Dennis Castro, male    DOB: 12-Aug-1960, 55 y.o.   MRN: [id number]  DOS:  09/21/2015 Type of visit - description : acute Interval history: Sx started 09-16-2015 in the afternoon: Nausea, generalized aches (myalgias, joint aches) mild abdominal pain >> all quadrants , like feeling bloated. Fever up to 102. He did feel better later that day. He also saw red blood when he had a bowel movement, was not mixed with the stools, usually when he wiped. This is not a new issue.  The next day he had the same symptoms although they were not as severe as the previous day. Again late in the day he improved spontaneously.  On 6-17 and 6-18 he actually fell great.  Yesterday, all symptoms came back, this time they were severe: Fever, chills, nausea, generalized stiffness, mild global abdominal pain and also blood in the stools. He did have headaches only when the fever was up. The fevers have been consistently respond to Tylenol and ibuprofen  Went to the ER: Workup reviewed: CT abdomen - GB-kidney  stones Labs: CMP and CBC  Satisfactory  (WBC 11.6, Hg 14.2)   Today's here at my request for reevaluation. States he is feeling much better.  Review of Systems Appetite has been okay except for yesterday Minimal sinus pain, no discharge No chest pain or difficulty breathing No actual vomiting. Bowel movements have been daily, only one time the stools were loose. No cough, no rash No dysuria, gross hematuria difficulty urinating. No known tick bite, no other people in the family affected with similar symptoms  Past Medical History  Diagnosis Date  . GERD (gastroesophageal reflux disease)   . Seasonal allergies   . Allergy   . Arthritis   . Diverticulitis   . Hyperlipidemia   . Family history of heart disease   . Atypical chest pain   . Cholelithiasis 09/2015    determined by CT, asymptomatic    Past Surgical History  Procedure Laterality Date  . Tonsillectomy      age 8  . Cardiac catheterization  2010    Dr. Allyson Sabal; less than 30% occlusive disease    Social History   Social History  . Marital Status: Married    Spouse Name: N/A  . Number of Children: 1  . Years of Education: N/A   Occupational History  . Production designer, theatre/television/film, office job    Social History Main Topics  . Smoking status: Never Smoker   . Smokeless tobacco: Never Used  . Alcohol Use: No  . Drug Use: No  . Sexual Activity: Not on file   Other Topics Concern  . Not on file   Social History Narrative   64 y/o son        Medication List       This list is accurate as of: 09/21/15 11:59 PM.  Always use your most recent med list.               loratadine 10 MG tablet  Commonly known as:  CLARITIN  Take 10 mg by mouth daily as needed for allergies. Reported on 09/21/2015     ranitidine 150 MG tablet  Commonly known as:  ZANTAC  Take 150 mg by mouth daily. Reported on 09/21/2015     sildenafil 20 MG tablet  Commonly known as:  REVATIO  Take 2-3 tablets (40-60 mg total) by mouth daily as needed.  Objective:   Physical Exam BP 118/66 mmHg  Pulse 65  Temp(Src) 97.7 F (36.5 C) (Oral)  Ht 6\' 5"  (1.956 m)  Wt 317 lb 9 oz (144.045 kg)  BMI 37.65 kg/m2  SpO2 98% General:   Well developed, well nourished . NAD. Afebrile, skin moist without overt diaphoresis  HEENT:  Normocephalic . Face symmetric, atraumatic Neck: Full range of motion. No stiffness. Conjunctiva without lesions  Lungs:  CTA B Normal respiratory effort, no intercostal retractions, no accessory muscle use. Heart: RRR,  no murmur.  no pretibial edema bilaterally  Abdomen:  Not distended, soft, non-tender. No rebound or rigidity.  DRE: peri anal skin slightly irritated. Very difficult to reach the prostate, I did notice a small amount of fresh blood in the glove. Hemoccult + Skin: No rash, nails without lesions  Neurologic:  alert & oriented X3.  Speech normal, gait appropriate for age and  unassisted Psych--  Cognition and judgment appear intact.  Cooperative with normal attention span and concentration.  Behavior appropriate. No anxious or depressed appearing.    Assessment & Plan:   Assessment > Hyperlipidemia before  GERD Obesity  CAD: Cath  2010 < 30% occlusive disease Diverticulitis Seasonal allergies +FH prostate ca (father age 53), CAD (mother age 20) GB-kidney  Stones  PLAN: Fever: 5 days history of on and off fever associated with nausea, aches, muscle stiffness, blood per rectum (fresh appearing, apparently a on-off issue , unclear if actually is related to acute process). DDX includes viral illness, atypical presentation of bacterial infection such as pneumonia, UTI, prostatitis.It is hard to imagine he has an overwhelming disease like sepsis,  meningitis or endocarditis since he is not progressively getting worse. Plan: Chest x-ray, UA, urine culture, blood cultures 2, PSA, repeat a CBC, Lyme serology Observation ER immediately if high fever and return of previous symptoms;  particularly rash, severe headache, neck stiffness. At the end he may need to be admitted to the hospital for further eval and treatment. Will also get a GI referral for blood per rectum. RTC 1 week

## 2015-09-21 NOTE — Patient Instructions (Addendum)
GO TO THE LAB : Get the blood work     GO TO THE FRONT DESK Schedule your next appointment for a  checkup in one week   STOP BY THE FIRST FLOOR:  get the XR    Rest, drink plenty of fluids, okay to take Tylenol or ibuprofen twice a day.  If you have a  rash, severe headache, high fever that does not decrease with Tylenol or associated symptoms come back:  Go to the ER at once

## 2015-09-22 LAB — LYME AB/WESTERN BLOT REFLEX

## 2015-09-22 NOTE — Assessment & Plan Note (Signed)
Fever: 5 days history of on and off fever associated with nausea, aches, muscle stiffness, blood per rectum (fresh appearing, apparently a on-off issue , unclear if actually is related to acute process). DDX includes viral illness, atypical presentation of bacterial infection such as pneumonia, UTI, prostatitis.It is hard to imagine he has an overwhelming disease like sepsis,  meningitis or endocarditis since he is not progressively getting worse. Plan: Chest x-ray, UA, urine culture, blood cultures 2, PSA, repeat a CBC, Lyme serology Observation ER immediately if high fever and return of previous symptoms;  particularly rash, severe headache, neck stiffness. At the end he may need to be admitted to the hospital for further eval and treatment. Will also get a GI referral for blood per rectum. RTC 1 week

## 2015-09-23 LAB — URINE CULTURE
Colony Count: NO GROWTH
ORGANISM ID, BACTERIA: NO GROWTH

## 2015-09-28 LAB — CULTURE, BLOOD (SINGLE)
ORGANISM ID, BACTERIA: NO GROWTH
Organism ID, Bacteria: NO GROWTH

## 2015-10-01 ENCOUNTER — Encounter: Payer: Self-pay | Admitting: Internal Medicine

## 2015-10-01 ENCOUNTER — Ambulatory Visit (INDEPENDENT_AMBULATORY_CARE_PROVIDER_SITE_OTHER): Payer: 59 | Admitting: Internal Medicine

## 2015-10-01 VITALS — BP 124/76 | HR 78 | Temp 98.1°F | Ht 77.0 in | Wt 311.4 lb

## 2015-10-01 DIAGNOSIS — R509 Fever, unspecified: Secondary | ICD-10-CM

## 2015-10-01 DIAGNOSIS — S99921A Unspecified injury of right foot, initial encounter: Secondary | ICD-10-CM | POA: Diagnosis not present

## 2015-10-01 NOTE — Progress Notes (Signed)
Subjective:    Patient ID: Dennis Castro, male    DOB: Jul 07, 1960, 55 y.o.   MRN: [id number]  DOS:  10/01/2015 Type of visit - description : Follow-up Interval history: Was recently seen with fever, symptoms lasted 2 or 3 days and then he felt better ; soon after developed  sinus and chest congestion, was prescribed a Z-Pak. Respiratory sx are definitely improving. 3 days ago, a cramp woke him up in the middle of the night, he stood up, he was very sleepy and fell injuring his right knee and right great toe. Knee pain is almost gone, toe is much less painful today.   Review of Systems   Currently with no fever chills No nausea, vomiting, diarrhea.  Past Medical History  Diagnosis Date  . GERD (gastroesophageal reflux disease)   . Seasonal allergies   . Allergy   . Arthritis   . Diverticulitis   . Hyperlipidemia   . Family history of heart disease   . Atypical chest pain   . Cholelithiasis 09/2015    determined by CT, asymptomatic    Past Surgical History  Procedure Laterality Date  . Tonsillectomy      age 37  . Cardiac catheterization  2010    Dr. Allyson Sabal; less than 30% occlusive disease    Social History   Social History  . Marital Status: Married    Spouse Name: N/A  . Number of Children: 1  . Years of Education: N/A   Occupational History  . Production designer, theatre/television/film, office job    Social History Main Topics  . Smoking status: Never Smoker   . Smokeless tobacco: Never Used  . Alcohol Use: No  . Drug Use: No  . Sexual Activity: Not on file   Other Topics Concern  . Not on file   Social History Narrative   49 y/o son        Medication List       This list is accurate as of: 10/01/15  8:35 PM.  Always use your most recent med list.               azithromycin 250 MG tablet  Commonly known as:  ZITHROMAX  Take 250 mg by mouth daily.     loratadine 10 MG tablet  Commonly known as:  CLARITIN  Take 10 mg by mouth daily as needed for allergies. Reported on  09/21/2015     ranitidine 150 MG tablet  Commonly known as:  ZANTAC  Take 150 mg by mouth daily. Reported on 09/21/2015     sildenafil 20 MG tablet  Commonly known as:  REVATIO  Take 2-3 tablets (40-60 mg total) by mouth daily as needed.           Objective:   Physical Exam BP 124/76 mmHg  Pulse 78  Temp(Src) 98.1 F (36.7 C) (Oral)  Ht 6\' 5"  (1.956 m)  Wt 311 lb 6 oz (141.239 kg)  BMI 36.92 kg/m2  SpO2 96% General:   Well developed, well nourished . NAD.  HEENT:  Normocephalic . Face symmetric, atraumatic. Nose is slightly congested, TMs normal, throat without redness Lungs:  CTA B Normal respiratory effort, no intercostal retractions, no accessory muscle use. Heart: RRR,  no murmur.  No pretibial edema bilaterally  MSK: Right great toe without deformities, has some ecchymoses, range of motion is normal, some discomfort with passive flexion. Skin: Not pale. Not jaundice Neurologic:  alert & oriented X3.  Speech normal, gait appropriate for  age and unassisted Psych--  Cognition and judgment appear intact.  Cooperative with normal attention span and concentration.  Behavior appropriate. No anxious or depressed appearing.      Assessment & Plan:   Assessment > Hyperlipidemia before  GERD Obesity  CAD: Cath  2010 < 30% occlusive disease Diverticulitis Seasonal allergies +FH prostate ca (father age 24), CAD (mother age 40) GB-kidney  Stones  PLAN: Fever:Workup was negative, he become afebrile 3 days after last visit but developed respiratory symptoms, had a Z-Pak Rx elsewhere, he is feeling better. No further eval needed. Will call if symptoms resurface Toe injury: see HPI. Getting better after 3 days, no deformities on exam, offered x-ray, patient prefers to wait. He will call if he does not continue improving. RTC 01-2016 4 CPX

## 2015-10-01 NOTE — Patient Instructions (Signed)
Call if problems 

## 2015-10-01 NOTE — Assessment & Plan Note (Signed)
Fever:Workup was negative, he become afebrile 3 days after last visit but developed respiratory symptoms, had a Z-Pak Rx elsewhere, he is feeling better. No further eval needed. Will call if symptoms resurface Toe injury: see HPI. Getting better after 3 days, no deformities on exam, offered x-ray, patient prefers to wait. He will call if he does not continue improving. RTC 01-2016 4 CPX

## 2015-10-01 NOTE — Progress Notes (Signed)
Pre visit review using our clinic review tool, if applicable. No additional management support is needed unless otherwise documented below in the visit note. 

## 2015-10-20 ENCOUNTER — Ambulatory Visit: Payer: 59 | Admitting: Gastroenterology

## 2015-11-25 ENCOUNTER — Encounter: Payer: Self-pay | Admitting: Gastroenterology

## 2015-11-25 ENCOUNTER — Ambulatory Visit (INDEPENDENT_AMBULATORY_CARE_PROVIDER_SITE_OTHER): Payer: 59 | Admitting: Gastroenterology

## 2015-11-25 VITALS — BP 120/86 | HR 84 | Ht 77.0 in | Wt 319.6 lb

## 2015-11-25 DIAGNOSIS — R140 Abdominal distension (gaseous): Secondary | ICD-10-CM | POA: Diagnosis not present

## 2015-11-25 DIAGNOSIS — K219 Gastro-esophageal reflux disease without esophagitis: Secondary | ICD-10-CM

## 2015-11-25 DIAGNOSIS — K602 Anal fissure, unspecified: Secondary | ICD-10-CM

## 2015-11-25 MED ORDER — RANITIDINE HCL 150 MG PO TABS: 150.0000 mg | ORAL_TABLET | Freq: Two times a day (BID) | ORAL | 5 refills | Status: DC

## 2015-11-25 MED ORDER — AMBULATORY NON FORMULARY MEDICATION: 1 refills | Status: DC

## 2015-11-25 NOTE — Patient Instructions (Addendum)
You have been scheduled for an endoscopy. Please follow written instructions given to you at your visit today. If you use inhalers (even only as needed), please bring them with you on the day of your procedure. Your physician has requested that you go to www.startemmi.com and enter the access code given to you at your visit today. This web site gives a general overview about your procedure. However, you should still follow specific instructions given to you by our office regarding your preparation for the procedure.  Increase your Zantac 150 mg to one tablet by mouth twice daily. A new presription has been sent to your pharmacy.   We have [facility] to use rectaly three times a day as needed x 6-8 weeks.  You have been given a Low Fod map diet.   Start an over the counter fiber supplement daily.

## 2015-11-25 NOTE — Progress Notes (Signed)
HPI :  55 y/o male with a history of diverticulosis, GERD, and HLD, here for a new patient visit for symptoms of GERD and nausea  He states he felt sick in June with fever up to 103 with nausea at the time. He reports it lasted for about 1-2 days, resolved, and then again it came back, leading to ER visit. He is not sure what drove the process but appeared to resolve with empiric antibiotics. Although continues to have some intermittent nausea.   He otherwise reports a history of reflux and upper abdominal bloating. He reports longstanding pyrosis, usually precipitted by certain foods, and at night when lying down. He reports previously at a lower weight he had no symptoms. He reports symptoms longstanding for years. He denies any dysphagia. He thinks his nausea has mostly improved but still gets it periodically since his illness. No vomiting throughout this course. He has some bloating / pressure in his upper abdomen at times. He reports he has gained weight and now back around 320 lbs.  He takes zantac roughly once per day. He has symptoms roughly 50% of the days on this regimen, he thinks severity of symptoms can fluctuate. He reports remote use of PPIs. He has had a CT scan in June showing gallstones but denies any routine RUQ pain.   He has never had a prior upper endoscopy. NO FH of esophageal cancer. Father had pancreatic cancer at age 54.   He otherwise reports some occasional perianal pain when he passes stools. He has a daily bowel movement. Occasional rare blood in the stools in the toilet paper only. He has pain when he sees the blood. He has had some straining in the past. Passing some hard stools, but not usually.   Colonoscopy 05/24/2012 - left sided diverticulosis, no polyps  Past Medical History:  Diagnosis Date  . Allergy   . Arthritis   . Atypical chest pain   . Cholelithiasis 09/2015   determined by CT, asymptomatic  . Diverticulitis   . Family history of heart disease   .  GERD (gastroesophageal reflux disease)   . Hyperlipidemia   . Seasonal allergies      Past Surgical History:  Procedure Laterality Date  . CARDIAC CATHETERIZATION  2010   Dr. Allyson SabalBerry; less than 30% occlusive disease  . TONSILLECTOMY     age 89   Family History  Problem Relation Age of Onset  . Diabetes Mother   . CAD Mother 1868    CBAG  . Cancer Father     prostate cancer and panceatic cancer  . Breast cancer Maternal Grandmother   . Pancreatic cancer Paternal Grandfather   . Stroke Neg Hx   . Colon cancer Neg Hx    Social History  Substance Use Topics  . Smoking status: Never Smoker  . Smokeless tobacco: Never Used  . Alcohol use No   Current Outpatient Prescriptions  Medication Sig Dispense Refill  . loratadine (CLARITIN) 10 MG tablet Take 10 mg by mouth daily as needed for allergies. Reported on 09/21/2015    . ranitidine (ZANTAC) 150 MG tablet Take 150 mg by mouth daily. Reported on 09/21/2015    . sildenafil (REVATIO) 20 MG tablet Take 2-3 tablets (40-60 mg total) by mouth daily as needed. 30 tablet 5   No current facility-administered medications for this visit.    No Known Allergies   Review of Systems: All systems reviewed and negative except where noted in HPI.   Lab  Results  Component Value Date   WBC 4.9 09/21/2015   HGB 13.9 09/21/2015   HCT 42.1 09/21/2015   MCV 84.5 09/21/2015   PLT 175.0 09/21/2015    Lab Results  Component Value Date   CREATININE 1.07 09/20/2015   BUN 20 09/20/2015   NA 135 09/20/2015   K 3.8 09/20/2015   CL 102 09/20/2015   CO2 25 09/20/2015    Lab Results  Component Value Date   ALT 39 09/20/2015   AST 28 09/20/2015   ALKPHOS 65 09/20/2015   BILITOT 0.9 09/20/2015      Physical Exam: BP 120/86 (BP Location: Left Arm, Patient Position: Sitting, Cuff Size: Large)   Pulse 84   Ht 6\' 5"  (1.956 m)   Wt (!) 319 lb 9.6 oz (145 kg)   BMI 37.90 kg/m  Constitutional: Pleasant,well-developed, male in no acute  distress. HEENT: Normocephalic and atraumatic. Conjunctivae are normal. No scleral icterus. Neck supple.  Cardiovascular: Normal rate, regular rhythm.  Pulmonary/chest: Effort normal and breath sounds normal. No wheezing, rales or rhonchi. Abdominal: Soft, nondistended, protuberant, nontender. Bowel sounds active throughout. There are no masses palpable. No hepatomegaly. DRE: anal fissure anterior midline with pain to palpation, DRE otherwise normal Extremities: no edema Lymphadenopathy: No cervical adenopathy noted. Neurological: Alert and oriented to person place and time. Skin: Skin is warm and dry. No rashes noted. Psychiatric: Normal mood and affect. Behavior is normal.   ASSESSMENT AND PLAN: 55 y/o male here to have the following issues addressed:  GERD / nausea - chronic symptoms, not well controlled on once daily zantac with fairly frequent breakthrough. Given his age, obesity, male gender, he meets criteria for Barrett's screening and I offered him an EGD. Following discussion of risks / benefits of EGD he wished to proceed. Otherwise discussed options for reflux. He wants to avoid PPI if possible and will try higher dose zantac (150mg  BID). If he happens to have Barrett's on EGD, PPI will be recommended. We otherwise discussed importance of weight loss which I think will further help minimize his reflux. He agreed.   Anal fissure - the likely cause for his pain, with relatively recent colonoscopy. Recommend topical nitroglycerin ointment at 0.125% TID for 6-8 weeks as well as a daily fiber supplement. If no improvement he can follow up as needed.   Ileene PatrickSteven Armbruster, MD [facility] Pager [phone]  CC: Wanda PlumpPaz, Jose E, MD

## 2015-12-07 ENCOUNTER — Ambulatory Visit (AMBULATORY_SURGERY_CENTER): Payer: 59 | Admitting: Gastroenterology

## 2015-12-07 ENCOUNTER — Encounter: Payer: Self-pay | Admitting: Gastroenterology

## 2015-12-07 VITALS — BP 99/63 | HR 73 | Temp 98.4°F | Resp 11 | Ht 77.0 in | Wt 319.0 lb

## 2015-12-07 DIAGNOSIS — K219 Gastro-esophageal reflux disease without esophagitis: Secondary | ICD-10-CM | POA: Diagnosis present

## 2015-12-07 MED ORDER — SODIUM CHLORIDE 0.9 % IV SOLN: 500.0000 mL | INTRAVENOUS | Status: DC

## 2015-12-07 NOTE — Patient Instructions (Signed)
YOU HAD AN ENDOSCOPIC PROCEDURE TODAY AT THE [facility]:   Refer to the procedure report that was given to you for any specific questions about what was found during the examination.  If the procedure report does not answer your questions, please call your gastroenterologist to clarify.  If you requested that your care partner not be given the details of your procedure findings, then the procedure report has been included in a sealed envelope for you to review at your convenience later.  YOU SHOULD EXPECT: Some feelings of bloating in the abdomen. Passage of more gas than usual.  Walking can help get rid of the air that was put into your GI tract during the procedure and reduce the bloating.   Please Note:  You might notice some irritation and congestion in your nose or some drainage.  This is from the oxygen used during your procedure.  There is no need for concern and it should clear up in a day or so.  SYMPTOMS TO REPORT IMMEDIATELY:    Following upper endoscopy (EGD)  Vomiting of blood or coffee ground material  New chest pain or pain under the shoulder blades  Painful or persistently difficult swallowing  New shortness of breath  Fever of 100F or higher  Black, tarry-looking stools  For urgent or emergent issues, a gastroenterologist can be reached at any hour by calling [phone].   DIET:  We do recommend a small meal at first, but then you may proceed to your regular diet.  Drink plenty of fluids but you should avoid alcoholic beverages for 24 hours.  ACTIVITY:  You should plan to take it easy for the rest of today and you should NOT DRIVE or use heavy machinery until tomorrow (because of the sedation medicines used during the test).    FOLLOW UP: Our staff will call the number listed on your records the next business day following your procedure to check on you and address any questions or concerns that you may have regarding the information given to you  following your procedure. If we do not reach you, we will leave a message.  However, if you are feeling well and you are not experiencing any problems, there is no need to return our call.  We will assume that you have returned to your regular daily activities without incident.  If any biopsies were taken you will be contacted by phone or by letter within the next 1-3 weeks.  Please call us at [phone] if you have not heard about the biopsies in 3 weeks.    SIGNATURES/CONFIDENTIALITY: You and/or your care partner have signed paperwork which will be entered into your electronic medical record.  These signatures attest to the fact that that the information above on your After Visit Summary has been reviewed and is understood.  Full responsibility of the confidentiality of this discharge information lies with you and/or your care-partner.  Thank-you for choosing us for your healthcare needs today. 

## 2015-12-07 NOTE — Op Note (Signed)
[facility] Patient Name: Dennis Castro Procedure Date: 12/07/2015 10:10 AM MRN: [id number] Endoscopist: Viviann Spare P. Adela Lank , MD Age: 55 Referring MD:  Date of Birth: Apr 04, 1960 Gender: Male Account #: [id number] Procedure:                Upper GI endoscopy Indications:              Screening for Barrett's esophagus, history of                            reflux, now improved on twice daily Zantac Medicines:                Monitored Anesthesia Care Procedure:                Pre-Anesthesia Assessment:                           - Prior to the procedure, a History and Physical                            was performed, and patient medications and                            allergies were reviewed. The patient's tolerance of                            previous anesthesia was also reviewed. The risks                            and benefits of the procedure and the sedation                            options and risks were discussed with the patient.                            All questions were answered, and informed consent                            was obtained. Prior Anticoagulants: The patient has                            taken no previous anticoagulant or antiplatelet                            agents. ASA Grade Assessment: II - A patient with                            mild systemic disease. After reviewing the risks                            and benefits, the patient was deemed in                            satisfactory condition to undergo the procedure.  After obtaining informed consent, the endoscope was                            passed under direct vision. Throughout the                            procedure, the patient's blood pressure, pulse, and                            oxygen saturations were monitored continuously. The                            Model GIF-HQ190 [id number]) scope was introduced                            through the  mouth, and advanced to the second part                            of duodenum. The upper GI endoscopy was                            accomplished without difficulty. The patient                            tolerated the procedure well. Scope In: Scope Out: Findings:                 Esophagogastric landmarks were identified: the                            Z-line was found at 45 cm, the gastroesophageal                            junction was found at 45 cm and the upper extent of                            the gastric folds was found at 45 cm from the                            incisors.                           The exam of the esophagus was otherwise normal. No                            evidence of Barrett's esophagus or esophagitis was                            appreciated.                           The entire examined stomach was normal.                           Nodular mucosa was found at the entrance  of the                            duodenal bulb grossly consistent with benign                            ecoptic gastric mucosa. Biopsies were taken with a                            cold forceps for histology to ensure no adenomatous                            changes.                           The exam of the duodenum was otherwise normal. Complications:            No immediate complications. Estimated blood loss:                            Minimal. Estimated Blood Loss:     Estimated blood loss was minimal. Impression:               - Esophagogastric landmarks identified.                           - Normal esophagus, no evidence of Barrett's                           - Normal stomach.                           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 Biopsied. Recommendation:           - Patient has a contact number available for                            emergencies. The signs and symptoms of potential                             delayed complications were discussed with the                            patient. Return to normal activities tomorrow.                            Written discharge instructions were provided to the                            patient.                           - Resume previous diet.                           -  Continue present medications (Zantac)                           - Await pathology results.                           - No repeat upper endoscopy for Barrett's screening Karman Biswell P. Adela Lank, MD 12/07/2015 10:28:59 AM This report has been signed electronically.

## 2015-12-07 NOTE — Progress Notes (Signed)
Dental advisory given to patient 

## 2015-12-07 NOTE — Progress Notes (Signed)
Called to room to assist during endoscopic procedure.  Patient ID and intended procedure confirmed with present staff. Received instructions for my participation in the procedure from the performing physician.  

## 2015-12-08 ENCOUNTER — Telehealth: Payer: Self-pay | Admitting: *Deleted

## 2015-12-08 NOTE — Telephone Encounter (Signed)
  Follow up Call-  Call back number 12/07/2015  Post procedure Call Back phone  # [phone]  Permission to leave phone message Yes  Some recent data might be hidden     Patient questions:  Do you have a fever, pain , or abdominal swelling? No. Pain Score  0 *  Have you tolerated food without any problems? Yes.    Have you been able to return to your normal activities? Yes.    Do you have any questions about your discharge instructions: Diet   No. Medications  No. Follow up visit  No.  Do you have questions or concerns about your Care? No.  Actions: * If pain score is 4 or above: No action needed, pain <4.

## 2015-12-09 ENCOUNTER — Encounter: Payer: Self-pay | Admitting: Gastroenterology

## 2016-02-01 ENCOUNTER — Encounter: Payer: 59 | Admitting: Internal Medicine

## 2017-03-20 ENCOUNTER — Ambulatory Visit: Payer: 59 | Admitting: Internal Medicine

## 2017-03-20 ENCOUNTER — Encounter: Payer: Self-pay | Admitting: Internal Medicine

## 2017-03-20 VITALS — BP 118/78 | HR 71 | Temp 98.1°F | Resp 14 | Ht 77.0 in | Wt 329.2 lb

## 2017-03-20 DIAGNOSIS — R21 Rash and other nonspecific skin eruption: Secondary | ICD-10-CM | POA: Diagnosis not present

## 2017-03-20 DIAGNOSIS — N529 Male erectile dysfunction, unspecified: Secondary | ICD-10-CM | POA: Diagnosis not present

## 2017-03-20 MED ORDER — KETOCONAZOLE 2 % EX CREA: 1.0000 "application " | TOPICAL_CREAM | Freq: Every day | CUTANEOUS | 0 refills | Status: DC

## 2017-03-20 MED ORDER — SILDENAFIL CITRATE 20 MG PO TABS: 40.0000 mg | ORAL_TABLET | Freq: Every day | ORAL | 5 refills | Status: DC | PRN

## 2017-03-20 NOTE — Patient Instructions (Signed)
Please  schedule a physical exam at your earliest convenience  For the rash: Ketoconazole mixed with OTC hydrocortisone 1% 50-50: Apply twice a day Use it for 10 days Call if not improving

## 2017-03-20 NOTE — Progress Notes (Signed)
Subjective:    Patient ID: Dennis Castro, male    DOB: [birth year], 56 y.o.   MRN: [id number]  DOS:  03/20/2017 Type of visit - description : Acute Interval history: Developed a rash under the left arm 7-10 days ago, it occasionally itches and sometimes burns. Has not put any medications on it. Concerned about shingles ED: Request a refill on Viagra.  Review of Systems No other family members affected. Denies any fever chills  He did sleep in a hotel before the onset of symptoms.  Past Medical History:  Diagnosis Date  . Arthritis   . Atypical chest pain   . Cholelithiasis 09/2015   determined by CT, asymptomatic  . Diverticulitis   . Family history of heart disease   . GERD (gastroesophageal reflux disease)   . Hyperlipidemia   . Seasonal allergies     Past Surgical History:  Procedure Laterality Date  . CARDIAC CATHETERIZATION  2010   Dr. Allyson Sabal; less than 30% occlusive disease  . TONSILLECTOMY     age 13    Social History   Socioeconomic History  . Marital status: Married    Spouse name: Not on file  . Number of children: 1  . Years of education: Not on file  . Highest education level: Not on file  Social Needs  . Financial resource strain: Not on file  . Food insecurity - worry: Not on file  . Food insecurity - inability: Not on file  . Transportation needs - medical: Not on file  . Transportation needs - non-medical: Not on file  Occupational History  . Occupation: Production designer, theatre/television/film, office job  Tobacco Use  . Smoking status: Never Smoker  . Smokeless tobacco: Never Used  Substance and Sexual Activity  . Alcohol use: No  . Drug use: No  . Sexual activity: Not on file  Other Topics Concern  . 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 Always use your most recent med list.          ketoconazole 2 % cream Commonly known as:   NIZORAL Apply 1 application topically daily.   ranitidine 150 MG tablet Commonly known as:  ZANTAC Take 1 tablet (150 mg total) by mouth 2 (two) times daily. Reported on 09/21/2015   sildenafil 20 MG tablet Commonly known as:  REVATIO Take 2-3 tablets (40-60 mg total) by mouth daily as needed.          Objective:   Physical Exam BP 118/78 (BP Location: Right Arm, Patient Position: Sitting, Cuff Size: Normal)   Pulse 71   Temp 98.1 F (36.7 C) (Oral)   Resp 14   Ht 6\' 5"  (1.956 m)   Wt (!) 329 lb 4 oz (149.3 kg)   SpO2 97%   BMI 39.04 kg/m  General:   Well developed, well nourished . NAD.  HEENT:  Normocephalic . Face symmetric, atraumatic Skin:  Right armpit normal Left armpit: He has several 2-3 mm skin lesions, red, slightly scaly, not blistery, arrange in a ring fashion. Neurologic:  alert & oriented X3.  Speech normal, gait appropriate for age and unassisted Psych--  Cognition and judgment appear intact.  Cooperative with normal attention span and concentration.  Behavior appropriate. No anxious or depressed appearing.  Assessment & Plan:     Assessment > Hyperlipidemia before  GERD Obesity  CAD: Cath  2010 < 30% occlusive disease Diverticulitis Seasonal allergies +FH prostate ca (father age 23), CAD (mother age 15) GB-kidney  Stones  PLAN: Rash: Likely fungal, trial with ketoconazole/hydrocortisone.  Call if not better ED: Refill Viagra Reports she had a flu shot already Due for a CPX, encouraged to RTC.

## 2017-03-20 NOTE — Progress Notes (Signed)
Pre visit review using our clinic review tool, if applicable. No additional management support is needed unless otherwise documented below in the visit note. 

## 2017-03-21 DIAGNOSIS — N529 Male erectile dysfunction, unspecified: Secondary | ICD-10-CM | POA: Insufficient documentation

## 2017-03-21 NOTE — Assessment & Plan Note (Signed)
Rash: Likely fungal, trial with ketoconazole/hydrocortisone.  Call if not better ED: Refill Viagra Reports she had a flu shot already Due for a CPX, encouraged to RTC.

## 2017-03-31 DIAGNOSIS — H1030 Unspecified acute conjunctivitis, unspecified eye: Secondary | ICD-10-CM | POA: Diagnosis not present

## 2017-04-23 DIAGNOSIS — H0100A Unspecified blepharitis right eye, upper and lower eyelids: Secondary | ICD-10-CM | POA: Diagnosis not present

## 2017-04-23 DIAGNOSIS — H0100B Unspecified blepharitis left eye, upper and lower eyelids: Secondary | ICD-10-CM | POA: Diagnosis not present

## 2017-06-22 ENCOUNTER — Encounter: Payer: Self-pay | Admitting: Internal Medicine

## 2017-07-05 ENCOUNTER — Emergency Department (HOSPITAL_COMMUNITY)
Admission: EM | Admit: 2017-07-05 | Discharge: 2017-07-05 | Disposition: A | Discharge status: Home / Self Care | Payer: 59 | Attending: Emergency Medicine | Admitting: Emergency Medicine

## 2017-07-05 ENCOUNTER — Ambulatory Visit: Payer: Self-pay

## 2017-07-05 ENCOUNTER — Emergency Department (HOSPITAL_COMMUNITY): Payer: 59

## 2017-07-05 ENCOUNTER — Encounter (HOSPITAL_COMMUNITY): Payer: Self-pay | Admitting: Emergency Medicine

## 2017-07-05 DIAGNOSIS — R0789 Other chest pain: Secondary | ICD-10-CM | POA: Diagnosis not present

## 2017-07-05 DIAGNOSIS — R079 Chest pain, unspecified: Secondary | ICD-10-CM | POA: Diagnosis not present

## 2017-07-05 DIAGNOSIS — Z79899 Other long term (current) drug therapy: Secondary | ICD-10-CM | POA: Insufficient documentation

## 2017-07-05 LAB — CBC
HCT: 46.2 % (ref 39.0–52.0)
Hemoglobin: 15.2 g/dL (ref 13.0–17.0)
MCH: 28.1 pg (ref 26.0–34.0)
MCHC: 32.9 g/dL (ref 30.0–36.0)
MCV: 85.6 fL (ref 78.0–100.0)
PLATELETS: 300 10*3/uL (ref 150–400)
RBC: 5.4 MIL/uL (ref 4.22–5.81)
RDW: 13.4 % (ref 11.5–15.5)
WBC: 9.5 10*3/uL (ref 4.0–10.5)

## 2017-07-05 LAB — BASIC METABOLIC PANEL
Anion gap: 10 (ref 5–15)
BUN: 13 mg/dL (ref 6–20)
CHLORIDE: 106 mmol/L (ref 101–111)
CO2: 22 mmol/L (ref 22–32)
CREATININE: 1.12 mg/dL (ref 0.61–1.24)
Calcium: 9 mg/dL (ref 8.9–10.3)
GFR calc Af Amer: >60 mL/min (ref >60)
GFR calc non Af Amer: >60 mL/min (ref >60)
Glucose, Bld: 111 mg/dL — ABNORMAL HIGH (ref 65–99)
Potassium: 3.8 mmol/L (ref 3.5–5.1)
Sodium: 138 mmol/L (ref 135–145)

## 2017-07-05 LAB — I-STAT TROPONIN, ED
Troponin i, poc: 0 ng/mL (ref 0.00–0.08)
Troponin i, poc: 0 ng/mL (ref 0.00–0.08)

## 2017-07-05 NOTE — ED Provider Notes (Signed)
[facility] EMERGENCY DEPARTMENT Provider Note   CSN: [id number] Arrival date & time: 07/05/17  1406     History   Chief Complaint Chief Complaint  Patient presents with  . Chest Pain    HPI Dennis Castro is a 57 y.o. male.  HPI  57 year old male presents with chest pain.  He states been on and off for several days, up to 1 week.  Feels like a heaviness/squeezing in the middle of his chest.  Has been coming and going with no clear etiology.  However today it is much more constant.  There is no radiation of the pain, shortness of breath, abdominal pain, nausea, vomiting or diaphoresis.  The pain is mild to moderate, rates it is about a 3 out of 10.  He states exertion does not make it worse, particularly walking or walking up stairs is no change.  Has not tried anything for these pains.  He states he has not had a history of hypertension, diabetes, hyperlipidemia and does not smoke.  Mom had a history of coronary disease but he thinks she was in her 70s or 17s when she developed this. No leg pain, swelling or travel.  Past Medical History:  Diagnosis Date  . Arthritis   . Atypical chest pain   . Cholelithiasis 09/2015   determined by CT, asymptomatic  . Diverticulitis   . Family history of heart disease   . GERD (gastroesophageal reflux disease)   . Hyperlipidemia   . Seasonal allergies     Patient Active Problem List   Diagnosis Date Noted  . Erectile dysfunction 03/21/2017  . Obesity 07/20/2015  . Annual physical exam 01/17/2015  . PCP NOTES >>> 01/17/2015  . Family history of prostate cancer 03/25/2012  . GERD 04/01/2010  . ANAL OR RECTAL PAIN 04/01/2010  . HYPERLIPIDEMIA 10/07/2007    Past Surgical History:  Procedure Laterality Date  . CARDIAC CATHETERIZATION  2010   Dr. Allyson Sabal; less than 30% occlusive disease  . TONSILLECTOMY     age 81        Home Medications    Prior to Admission medications   Medication Sig Start Date End Date  Taking? Authorizing Provider  ketoconazole (NIZORAL) 2 % cream Apply 1 application topically daily. 03/20/17   Wanda Plump, MD  ranitidine (ZANTAC) 150 MG tablet Take 1 tablet (150 mg total) by mouth 2 (two) times daily. Reported on 09/21/2015 11/25/15   Benancio Deeds, MD  sildenafil (REVATIO) 20 MG tablet Take 2-3 tablets (40-60 mg total) by mouth daily as needed. 03/20/17   Wanda Plump, MD    Family History Family History  Problem Relation Age of Onset  . Diabetes Mother   . CAD Mother 71       CBAG  . Cancer Father        prostate cancer and panceatic cancer  . Breast cancer Maternal Grandmother   . Pancreatic cancer Paternal Grandfather   . Stroke Neg Hx   . Colon cancer Neg Hx     Social History Social History   Tobacco Use  . Smoking status: Never Smoker  . Smokeless tobacco: Never Used  Substance Use Topics  . Alcohol use: No  . Drug use: No     Allergies   Patient has no known allergies.   Review of Systems Review of Systems  Constitutional: Negative for diaphoresis.  Respiratory: Negative for shortness of breath.   Cardiovascular: Positive for chest pain.  Gastrointestinal:  Negative for abdominal pain and vomiting.  Musculoskeletal: Negative for back pain.  All other systems reviewed and are negative.    Physical Exam Updated Vital Signs BP 128/66 (BP Location: Right Arm)   Pulse 64   Temp 98.5 F (36.9 C) (Oral)   Resp 18   SpO2 99%   Physical Exam  Constitutional: He is oriented to person, place, and time. He appears well-developed and well-nourished.  Non-toxic appearance. He does not appear ill.  Obese, resting comfortably  HENT:  Head: Normocephalic and atraumatic.  Right Ear: External ear normal.  Left Ear: External ear normal.  Nose: Nose normal.  Eyes: Right eye exhibits no discharge. Left eye exhibits no discharge.  Neck: Neck supple.  Cardiovascular: Normal rate, regular rhythm and normal heart sounds.  Pulmonary/Chest: Effort  normal and breath sounds normal. He exhibits no tenderness.  Abdominal: Soft. He exhibits no distension. There is no tenderness.  Musculoskeletal: He exhibits no edema.  Neurological: He is alert and oriented to person, place, and time.  Skin: Skin is warm and dry.  Nursing note and vitals reviewed.    ED Treatments / Results  Labs (all labs ordered are listed, but only abnormal results are displayed) Labs Reviewed  BASIC METABOLIC PANEL - Abnormal; Notable for the following components:      Result Value   Glucose, Bld 111 (*)    All other components within normal limits  CBC  I-STAT TROPONIN, ED  I-STAT TROPONIN, ED    EKG EKG Interpretation  Date/Time:  Thursday July 05 2017 14:21:01 EDT Ventricular Rate:  78 PR Interval:  140 QRS Duration: 104 QT Interval:  370 QTC Calculation: 421 R Axis:   64 Text Interpretation:  Normal sinus rhythm Normal ECG no significant change since 2007 Confirmed by Pricilla Loveless [phone]) on 07/05/2017 8:44:58 PM   Radiology Dg Chest 2 View  Result Date: 07/05/2017 CLINICAL DATA:  Chest pain EXAM: CHEST - 2 VIEW COMPARISON:  September 21, 2015 FINDINGS: There is no edema or consolidation. The heart size and pulmonary vascularity are normal. No adenopathy. There is mild degenerative change in the thoracic spine. IMPRESSION: No edema or consolidation. Electronically Signed   By: Bretta Bang III M.D.   On: 07/05/2017 14:44    Procedures Procedures (including critical care time)  Medications Ordered in ED Medications - No data to display   Initial Impression / Assessment and Plan / ED Course  I have reviewed the triage vital signs and the nursing notes.  Pertinent labs & imaging results that were available during my care of the patient were reviewed by me and considered in my medical decision making (see chart for details).     Patient's chest pain is pretty atypical.  His main risk factors include age and obesity.  However, his heart  score is still a 3.  ECG is nonischemic and unchanging.  He overall appears well.  My suspicion for ACS is quite low.  No risk factors for PE.  No tachycardia or tachypnea or hypoxia.  I think it is reasonable to discharge him home with outpatient follow-up given 2 negative troponins.  Follow-up with PCP.  We discussed that Y does not have an MI he could still have coronary disease and he needs to follow-up closely and return if his symptoms worsen or become exertional or otherwise concerning.  Final Clinical Impressions(s) / ED Diagnoses   Final diagnoses:  Atypical chest pain    ED Discharge Orders    None  Pricilla LovelessGoldston, Elaine Roanhorse, MD 07/05/17 2207

## 2017-07-05 NOTE — ED Triage Notes (Signed)
Patient presents to ED for assessment of central chest pressure intermittent 'for several months", but today he states the pressure has not been going away.  C/o dcecreased energy for a few weeks.  Denies any other associated symptoms.  States hx of GERD.  Described pain as a "rubberband" around his esophagus.

## 2017-07-05 NOTE — Telephone Encounter (Signed)
Reported having "mid chest fullness, tightness, and heaviness" x 1 week.  Reported "feels like a 2-3 # weight is sitting on middle of my chest."  "It doesn't go away."  Reported "more prominent today."  Denied radiation of pain. Denied shortness of breath, nausea, sweating, or dizziness.  Advised to go to the ER to rule out cardiac cause of pain.  Encouraged to have someone take him to the ER.  Reported he does not feel he needs someone to drive him.  [facility] Pt. Agreed to go straight to ER.       Reason for Disposition . Chest pain lasts > 5 minutes (Exceptions: chest pain occurring > 3 days ago and now asymptomatic; same as previously diagnosed heartburn and has accompanying sour taste in mouth)  Answer Assessment - Initial Assessment Questions 1. LOCATION: "Where does it hurt?"       Middle of chest like a tightness or fullness feeling; pressure like a 2-3 # weight on chest 2. RADIATION: "Does the pain go anywhere else?" (e.g., into neck, jaw, arms, back)     No radiation 3. ONSET: "When did the chest pain begin?" (Minutes, hours or days)      Has been there a week or more; more pronounced 4. PATTERN "Does the pain come and go, or has it been constant since it started?"  "Does it get worse with exertion?"      Constant 5. DURATION: "How long does it last" (e.g., seconds, minutes, hours)     Ongoing 6. SEVERITY: "How bad is the pain?"  (e.g., Scale 1-10; mild, moderate, or severe)    - MILD (1-3): doesn't interfere with normal activities     - MODERATE (4-7): interferes with normal activities or awakens from sleep    - SEVERE (8-10): excruciating pain, unable to do any normal activities       3-4/10 7. CARDIAC RISK FACTORS: "Do you have any history of heart problems or risk factors for heart disease?" (e.g., prior heart attack, angina; high blood pressure, diabetes, being overweight, high cholesterol, smoking, or strong family history of heart disease)     Hx of  CAD 8. PULMONARY RISK FACTORS: "Do you have any history of lung disease?"  (e.g., blood clots in lung, asthma, emphysema, birth control pills)     no 9. CAUSE: "What do you think is causing the chest pain?"     Feels like he overeats and stays full 10. OTHER SYMPTOMS: "Do you have any other symptoms?" (e.g., dizziness, nausea, vomiting, sweating, fever, difficulty breathing, cough)       Denied n/v, sweating, shortness of breath, or dizziness, or pain in left shoulder, arm or jaw 11. PREGNANCY: "Is there any chance you are pregnant?" "When was your last menstrual period?"       n/a  Protocols used: CHEST PAIN-A-AH

## 2017-07-05 NOTE — Telephone Encounter (Signed)
FYI to PCP

## 2017-07-05 NOTE — Telephone Encounter (Signed)
Agree w ER eval

## 2017-07-09 ENCOUNTER — Encounter: Payer: Self-pay | Admitting: Internal Medicine

## 2017-07-09 ENCOUNTER — Ambulatory Visit: Payer: 59 | Admitting: Internal Medicine

## 2017-07-09 VITALS — BP 134/84 | HR 74 | Temp 98.3°F | Resp 16 | Ht 77.0 in | Wt 327.2 lb

## 2017-07-09 DIAGNOSIS — E785 Hyperlipidemia, unspecified: Secondary | ICD-10-CM

## 2017-07-09 DIAGNOSIS — R0683 Snoring: Secondary | ICD-10-CM | POA: Diagnosis not present

## 2017-07-09 DIAGNOSIS — M25572 Pain in left ankle and joints of left foot: Secondary | ICD-10-CM | POA: Diagnosis not present

## 2017-07-09 DIAGNOSIS — R079 Chest pain, unspecified: Secondary | ICD-10-CM

## 2017-07-09 MED ORDER — PANTOPRAZOLE SODIUM 40 MG PO TBEC: 40.0000 mg | DELAYED_RELEASE_TABLET | Freq: Every day | ORAL | 6 refills | Status: DC

## 2017-07-09 NOTE — Progress Notes (Signed)
Pre visit review using our clinic review tool, if applicable. No additional management support is needed unless otherwise documented below in the visit note. 

## 2017-07-09 NOTE — Progress Notes (Signed)
Subjective:    Patient ID: Dennis Castro, male    DOB: 11-10-1960, 57 y.o.   MRN: [id number]  DOS:  07/09/2017 Type of visit - description : er f/u  Interval history: Went to the ER 07/05/2017: Had chest pain, EKG showed no ischemic changes, troponin/CBC/BMP were normal.  Chest x-ray negative.  Here for follow-up  Patient describes chest pain as a tightness or squeezing on the upper mid anterior chest, symptoms are on and off for a while, the day he went to the ER the pain was particularly pronounced and he was lasting already few hours. No radiation.  Symptoms are not exertional. Occasionally associated with food intake however the day of the ER visit he "did not have a large meal". No further chest pain since the ER visit. Also in general feels tired, ankles feel stiff a little painful in the morning and has occasionally ankle edema.   Review of Systems Denies fever chills No cough No nausea or vomiting Does have some heartburn. + Sedentary lifestyle. + snoring, not every night but sometimes wife has to wake him up  Past Medical History:  Diagnosis Date  . Arthritis   . Atypical chest pain   . Cholelithiasis 09/2015   determined by CT, asymptomatic  . Diverticulitis   . Family history of heart disease   . GERD (gastroesophageal reflux disease)   . Hyperlipidemia   . Seasonal allergies     Past Surgical History:  Procedure Laterality Date  . CARDIAC CATHETERIZATION  2010   Dr. Allyson Sabal; less than 30% occlusive disease  . TONSILLECTOMY     age 60    Social History   Socioeconomic History  . Marital status: Married    Spouse name: Not on file  . Number of children: 1  . Years of education: Not on file  . Highest education level: Not on file  Occupational History  . Occupation: Production designer, theatre/television/film, office job  Social Needs  . Financial resource strain: Not on file  . Food insecurity:    Worry: Not on file    Inability: Not on file  . Transportation needs:    Medical: Not on  file    Non-medical: Not on file  Tobacco Use  . Smoking status: Never Smoker  . Smokeless tobacco: Never Used  Substance and Sexual Activity  . Alcohol use: No  . Drug use: No  . Sexual activity: Not on file  Lifestyle  . Physical activity:    Days per week: Not on file    Minutes per session: Not on file  . Stress: Not on file  Relationships  . Social connections:    Talks on phone: Not on file    Gets together: Not on file    Attends religious service: Not on file    Active member of club or organization: Not on file    Attends meetings of clubs or organizations: Not on file    Relationship status: Not on file  . Intimate partner violence:    Fear of current or ex partner: Not on file    Emotionally abused: Not on file    Physically abused: Not on file    Forced sexual activity: Not on file  Other Topics Concern  . Not on file  Social History Narrative   Teenage son      Allergies as of 07/09/2017   No Known Allergies     Medication List        Accurate as  of 07/09/17 11:59 PM. Always use your most recent med list.          aspirin EC 81 MG tablet Take 81 mg by mouth daily.   ketoconazole 2 % cream Commonly known as:  NIZORAL Apply 1 application topically daily.   pantoprazole 40 MG tablet Commonly known as:  PROTONIX Take 1 tablet (40 mg total) by mouth daily before breakfast.   ranitidine 150 MG tablet Commonly known as:  ZANTAC Take 1 tablet (150 mg total) by mouth 2 (two) times daily. Reported on 09/21/2015   sildenafil 20 MG tablet Commonly known as:  REVATIO Take 2-3 tablets (40-60 mg total) by mouth daily as needed.          Objective:   Physical Exam BP 134/84 (BP Location: Left Arm, Patient Position: Sitting, Cuff Size: Normal)   Pulse 74   Temp 98.3 F (36.8 C) (Oral)   Resp 16   Ht 6\' 5"  (1.956 m)   Wt (!) 327 lb 4 oz (148.4 kg)   SpO2 96%   BMI 38.81 kg/m  General:   Well developed, obese appearing gentleman, NAD.  HEENT:    Normocephalic . Face symmetric, atraumatic Lungs:  CTA B Normal respiratory effort, no intercostal retractions, no accessory muscle use. Heart: RRR,  no murmur.  no pretibial edema bilaterally  Abdomen:  Not distended, soft, non-tender. No rebound or rigidity.   Skin: Not pale. Not jaundice Neurologic:  alert & oriented X3.  Speech normal, gait appropriate for age and unassisted Psych--  Cognition and judgment appear intact.  Cooperative with normal attention span and concentration.  Behavior appropriate. No anxious or depressed appearing.     Assessment & Plan:   Assessment   Hyperlipidemia before  GERD Obesity  CAD:  Chest pain: Myoview stress test subtle anterior ischemia Cardiac cath 06-24-09: Noncritical 30% LAD with subtle anteroapical HK. Diverticulitis Seasonal allergies +FH prostate ca (father age 10), CAD (mother age 54) GB-kidney  Stones  PLAN: Chest pain: As described above, he is 41, history of gallbladder stones, GERD, + + sedentary lifestyle, + FH CAD, obese.  History of previous noncritical heart disease.  DDX includes CAD, gallbladder stones, GERD versus others. Definitely needs further investigation. Plan:  aspirin, FLP, A1c, cardiology referral has seen Dr. Allyson Sabal before.  ER if symptoms severe. Snoring: As above, Epworth scale scored 1 which is negative; reassess regulalrly Ankle pain, left, since he injured it in December, still bothering him, refer to sports medicine RTC 2 months

## 2017-07-09 NOTE — Patient Instructions (Signed)
GO TO THE FRONT DESK -Schedule labs to be done fasting tomorrow or the next day -Schedule your next appointment for a checkup in 2 months  Start aspirin 81 mg 1 daily with food  Start pantoprazole 40 mg 1 tablet before breakfast  ER if severe or persistent chest pain

## 2017-07-10 ENCOUNTER — Other Ambulatory Visit (INDEPENDENT_AMBULATORY_CARE_PROVIDER_SITE_OTHER): Payer: 59

## 2017-07-10 DIAGNOSIS — E785 Hyperlipidemia, unspecified: Secondary | ICD-10-CM

## 2017-07-10 LAB — LIPID PANEL
CHOL/HDL RATIO: 5
Cholesterol: 155 mg/dL (ref 0–200)
HDL: 33.9 mg/dL — ABNORMAL LOW (ref >39.00)
LDL Cholesterol: 96 mg/dL (ref 0–99)
NonHDL: 121.27
TRIGLYCERIDES: 126 mg/dL (ref 0.0–149.0)
VLDL: 25.2 mg/dL (ref 0.0–40.0)

## 2017-07-10 LAB — AST: AST: 19 U/L (ref 0–37)

## 2017-07-10 LAB — HEMOGLOBIN A1C: Hgb A1c MFr Bld: 5.7 % (ref 4.6–6.5)

## 2017-07-10 LAB — ALT: ALT: 33 U/L (ref 0–53)

## 2017-07-10 MED ORDER — ATORVASTATIN CALCIUM 10 MG PO TABS: 10.0000 mg | ORAL_TABLET | Freq: Every day | ORAL | 2 refills | Status: DC

## 2017-07-10 NOTE — Addendum Note (Signed)
Addended byConrad Alleman: Dennis Castro on: 07/10/2017 01:30 PM   Modules accepted: Orders

## 2017-07-10 NOTE — Assessment & Plan Note (Signed)
Chest pain: As described above, he is [age], history of gallbladder stones, GERD, + + sedentary lifestyle, + FH CAD, obese.  History of previous noncritical heart disease.  DDX includes CAD, gallbladder stones, GERD versus others. Definitely needs further investigation. Plan:  aspirin, FLP, A1c, cardiology referral has seen Dr. Allyson SabalBerry before.  ER if symptoms severe. Snoring: As above, Epworth scale scored 1 which is negative; reassess regulalrly Ankle pain, left, since he injured it in December, still bothering him, refer to sports medicine RTC 2 months

## 2017-07-17 ENCOUNTER — Encounter: Payer: Self-pay | Admitting: Family Medicine

## 2017-07-17 ENCOUNTER — Ambulatory Visit: Payer: 59 | Admitting: Family Medicine

## 2017-07-17 DIAGNOSIS — M25572 Pain in left ankle and joints of left foot: Secondary | ICD-10-CM

## 2017-07-17 DIAGNOSIS — G8929 Other chronic pain: Secondary | ICD-10-CM | POA: Diagnosis not present

## 2017-07-17 NOTE — Patient Instructions (Signed)
You strained your posterior tibialis tendon. Ice the area for 15 minutes at a time, 3-4 times a day Aleve 2 tabs twice a day with food OR ibuprofen 3 tabs three times a day with food for pain and inflammation as needed at this point. Elevate above the level of your heart when needed for swelling. Arch supports are important - something like dr. Jari Sportsmanscholls active series, spencos, superfeet, or our green insoles with scaphoid pad. Start theraband strengthening exercises when directed - once a day 3 sets of 10. Consider physical therapy for strengthening and balance exercises. Follow up in 6 weeks.

## 2017-07-20 ENCOUNTER — Encounter: Payer: Self-pay | Admitting: Family Medicine

## 2017-07-20 DIAGNOSIS — M25572 Pain in left ankle and joints of left foot: Secondary | ICD-10-CM | POA: Insufficient documentation

## 2017-07-20 NOTE — Progress Notes (Signed)
PCP and consultation requested by: Wanda PlumpPaz, Jose E, MD  Subjective:   HPI: Patient is a 57 y.o. male here for left ankle pain.  Patient reports back in December he accidentally inverted his left ankle during the snow storm. Felt like it popped and came back at that time. Never completely improved, continues to have feeling of weakness especially medially. Able to do things around house like mow the grass. History of prior sprains. Current pain is 0/10 - pain is more of a discomfort. No skin changes, numbness.  Past Medical History:  Diagnosis Date  . Arthritis   . Atypical chest pain   . Cholelithiasis 09/2015   determined by CT, asymptomatic  . Diverticulitis   . Family history of heart disease   . GERD (gastroesophageal reflux disease)   . Hyperlipidemia   . Seasonal allergies     Current Outpatient Medications on File Prior to Visit  Medication Sig Dispense Refill  . aspirin EC 81 MG tablet Take 81 mg by mouth daily.    [name]. atorvastatin (LIPITOR) 10 MG tablet Take 1 tablet (10 mg total) by mouth at bedtime. 30 tablet 2  . ketoconazole (NIZORAL) 2 % cream Apply 1 application topically daily. 30 g 0  . pantoprazole (PROTONIX) 40 MG tablet Take 1 tablet (40 mg total) by mouth daily before breakfast. 30 tablet 6  . ranitidine (ZANTAC) 150 MG tablet Take 1 tablet (150 mg total) by mouth 2 (two) times daily. Reported on 09/21/2015 60 tablet 5  . sildenafil (REVATIO) 20 MG tablet Take 2-3 tablets (40-60 mg total) by mouth daily as needed. 30 tablet 5   Current Facility-Administered Medications on File Prior to Visit  Medication Dose Route Frequency Provider Last Rate Last Dose  . 0.9 %  sodium chloride infusion  500 mL Intravenous Continuous Armbruster, Willaim RayasSteven P, MD        Past Surgical History:  Procedure Laterality Date  . CARDIAC CATHETERIZATION  2010   Dr. Allyson SabalBerry; less than 30% occlusive disease  . TONSILLECTOMY     age 11    No Known Allergies  Social History    Socioeconomic History  . Marital status: Married    Spouse name: Not on file  . Number of children: 1  . Years of education: Not on file  . Highest education level: Not on file  Occupational History  . Occupation: Production designer, theatre/television/filmmanager, office job  Social Needs  . Financial resource strain: Not on file  . Food insecurity:    Worry: Not on file    Inability: Not on file  . Transportation needs:    Medical: Not on file    Non-medical: Not on file  Tobacco Use  . Smoking status: Never Smoker  . Smokeless tobacco: Never Used  Substance and Sexual Activity  . Alcohol use: No  . Drug use: No  . Sexual activity: Not on file  Lifestyle  . Physical activity:    Days per week: Not on file    Minutes per session: Not on file  . Stress: Not on file  Relationships  . Social connections:    Talks on phone: Not on file    Gets together: Not on file    Attends religious service: Not on file    Active member of club or organization: Not on file    Attends meetings of clubs or organizations: Not on file    Relationship status: Not on file  . Intimate partner violence:    Fear of  current or ex partner: Not on file    Emotionally abused: Not on file    Physically abused: Not on file    Forced sexual activity: Not on file  Other Topics Concern  . 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 Diabetes Mother   . CAD Mother 60       CBAG  . Cancer Father        prostate cancer and panceatic cancer  . Breast cancer Maternal Grandmother   . Pancreatic cancer Paternal Grandfather   . Stroke Neg Hx   . Colon cancer Neg Hx     BP 121/83   Pulse 73   Ht 6\' 5"  (1.956 m)   Wt (!) 330 lb (149.7 kg)   BMI 39.13 kg/m   Review of Systems: See HPI above.     Objective:  Physical Exam:  Gen: NAD, comfortable in exam room  Left ankle: No gross deformity, swelling, ecchymoses.  Pes planus - more pronation than right. FROM with pain  on internal rotation, strength 5-/5 IR but 5/5 other motions. TTP along post tib tendon, mild.  No other tenderness. Negative ant drawer and talar tilt.   Negative syndesmotic compression. Thompsons test negative. NV intact distally.  Right ankle: No deformity. FROM with 5/5 strength. No tenderness to palpation. NVI distally.  MSK u/s left ankle: Thickening, tenosynovitis and neovascularity within post tib tendon consistent with tendinitis.  Assessment & Plan:  1. Left ankle pain - 2/2 strain of posterior tibialis tendon with resultant tendinitis/tenosynovitis.  Stressed importance of arch support.  Icing, aleve or ibuprofen.  Start theraband strengthening exercises.  Consider physical therapy, custom orthotics if not improving.  F/u in 6 weeks.

## 2017-07-20 NOTE — Assessment & Plan Note (Signed)
2/2 strain of posterior tibialis tendon with resultant tendinitis/tenosynovitis.  Stressed importance of arch support.  Icing, aleve or ibuprofen.  Start theraband strengthening exercises.  Consider physical therapy, custom orthotics if not improving.  F/u in 6 weeks.

## 2017-08-10 ENCOUNTER — Encounter: Payer: Self-pay | Admitting: Cardiovascular Disease

## 2017-08-10 ENCOUNTER — Ambulatory Visit: Payer: 59 | Admitting: Cardiovascular Disease

## 2017-08-10 VITALS — BP 115/77 | HR 64 | Ht 77.0 in | Wt 330.6 lb

## 2017-08-10 DIAGNOSIS — I2510 Atherosclerotic heart disease of native coronary artery without angina pectoris: Secondary | ICD-10-CM | POA: Insufficient documentation

## 2017-08-10 DIAGNOSIS — E785 Hyperlipidemia, unspecified: Secondary | ICD-10-CM | POA: Diagnosis not present

## 2017-08-10 MED ORDER — ATORVASTATIN CALCIUM 20 MG PO TABS: 20.0000 mg | ORAL_TABLET | Freq: Every day | ORAL | 6 refills | Status: DC

## 2017-08-10 NOTE — Assessment & Plan Note (Signed)
History of hyperlipidemia on low-dose statin therapy recent lipid profile performed 07/10/2017 revealing LDL of 96 which is increased from 77 on 07/19/2015 probably related to dietary indiscretion.  We will increase his Lipitor to 20 mg, recheck lab work in 3 months.  We talked about dietary modifications as well.

## 2017-08-10 NOTE — Progress Notes (Signed)
08/10/2017 TILTON CATA   22-Jun-1960  [id number]  Primary Physician Wanda Plump, MD Primary Cardiologist: Runell Gess MD Nicholes Calamity, [place]  HPI:  ODE GOLLEHON is a 57 y.o.  severely overweight married Caucasian male father of one  Who works at [employer] in [place]. I last saw him in the office 03/23/2015. He was having atypical chest pain and had a Myoview stress test that showed subtle anterior ischemia. Based on this he underwent cardiac catheterization by myself 06/24/09 revealing noncritical CAD with at most a 30% hypodense segmental mid LAD stent stenosis with very subtle anteroapical hypokinesia. Medical therapy is recommended. He continued to have atypical chest pain. He does have a family history of heart disease with a mother who had stents in her 60s and bypass surgery at 48.  Since I saw him back 2 1/2 years ago he is remained stable until recently when he was seen in the ER 07/05/2017 with atypical chest pain.  His EKG showed no acute changes and he ruled out by enzymes.  He had no recurrent symptoms.  His only complaints now are of mild increased lethargy compared to when I saw him several years ago.   Current Meds  Medication Sig  . aspirin EC 81 MG tablet Take 81 mg by mouth daily.  [name] atorvastatin (LIPITOR) 20 MG tablet Take 1 tablet (20 mg total) by mouth at bedtime.  [name] ketoconazole (NIZORAL) 2 % cream Apply 1 application topically daily.  . pantoprazole (PROTONIX) 40 MG tablet Take 1 tablet (40 mg total) by mouth daily before breakfast.  . ranitidine (ZANTAC) 150 MG tablet Take 1 tablet (150 mg total) by mouth 2 (two) times daily. Reported on 09/21/2015  . sildenafil (REVATIO) 20 MG tablet Take 2-3 tablets (40-60 mg total) by mouth daily as needed.  . [DISCONTINUED] atorvastatin (LIPITOR) 10 MG tablet Take 1 tablet (10 mg total) by mouth at bedtime.   Current Facility-Administered Medications for the 08/10/17 encounter (Office Visit) with  Runell Gess, MD  Medication  . 0.9 %  sodium chloride infusion     No Known Allergies  Social History   Socioeconomic History  . Marital status: Married    Spouse name: Not on file  . Number of children: 1  . Years of education: Not on file  . Highest education level: Not on file  Occupational History  . Occupation: Production designer, theatre/television/film, office job  Social Needs  . Financial resource strain: Not on file  . Food insecurity:    Worry: Not on file    Inability: Not on file  . Transportation needs:    Medical: Not on file    Non-medical: Not on file  Tobacco Use  . Smoking status: Never Smoker  . Smokeless tobacco: Never Used  Substance and Sexual Activity  . Alcohol use: No  . Drug use: No  . Sexual activity: Not on file  Lifestyle  . Physical activity:    Days per week: Not on file    Minutes per session: Not on file  . Stress: Not on file  Relationships  . Social connections:    Talks on phone: Not on file    Gets together: Not on file    Attends religious service: Not on file    Active member of club or organization: Not on file    Attends meetings of clubs or organizations: Not on file    Relationship status: Not on file  .  Intimate partner violence:    Fear of current or ex partner: Not on file    Emotionally abused: Not on file    Physically abused: Not on file    Forced sexual activity: Not on file  Other Topics Concern  . Not on file  Social History Narrative   Teenage son     Review of Systems: General: negative for chills, fever, night sweats or weight changes.  Cardiovascular: negative for chest pain, dyspnea on exertion, edema, orthopnea, palpitations, paroxysmal nocturnal dyspnea or shortness of breath Dermatological: negative for rash Respiratory: negative for cough or wheezing Urologic: negative for hematuria Abdominal: negative for nausea, vomiting, diarrhea, bright red blood per rectum, melena, or hematemesis Neurologic: negative for visual  changes, syncope, or dizziness All other systems reviewed and are otherwise negative except as noted above.    Blood pressure 115/77, pulse 64, height 6\' 5"  (1.956 m), weight (!) 330 lb 9.6 oz (150 kg).  General appearance: alert and no distress Neck: no adenopathy, no carotid bruit, no JVD, supple, symmetrical, trachea midline and thyroid not enlarged, symmetric, no tenderness/mass/nodules Lungs: clear to auscultation bilaterally Heart: regular rate and rhythm, S1, S2 normal, no murmur, click, rub or gallop Extremities: extremities normal, atraumatic, no cyanosis or edema Pulses: 2+ and symmetric Skin: Skin color, texture, turgor normal. No rashes or lesions Neurologic: Alert and oriented X 3, normal strength and tone. Normal symmetric reflexes. Normal coordination and gait  EKG not performed today  ASSESSMENT AND PLAN:   Hyperlipidemia History of hyperlipidemia on low-dose statin therapy recent lipid profile performed 07/10/2017 revealing LDL of 96 which is increased from 77 on 07/19/2015 probably related to dietary indiscretion.  We will increase his Lipitor to 20 mg, recheck lab work in 3 months.  We talked about dietary modifications as well.  Coronary artery disease History of minimal CAD by cath that I performed after a stress test showed subtle anteroapical ischemia on 06/24/2009 that showed a 30% hypodense LAD stenosis.  He does have a family history of heart disease.  He was recently seen in the ER 07/05/2017 with atypical chest pain.  EKG was unremarkable and his enzymes were negative.  He had no recurrent symptoms.      Runell Gess MD FACP,FACC,FAHA, [facility] 08/10/2017 8:52 AM

## 2017-08-10 NOTE — Patient Instructions (Signed)
Medication Instructions: Your physician recommends that you continue on your current medications as directed. Please refer to the Current Medication list given to you today.  Increase Atorvastatin to 20 mg daily.   Labwork: Your physician recommends that you return for a FASTING lipid profile and hepatic function panel in 3 months (August). No appointment needed.   Follow-Up: We request that you follow-up in: 6 months with an extender and in 12 months with Dr San Morelle will receive a reminder letter in the mail two months in advance. If you don't receive a letter, please call our office to schedule the follow-up appointment.  If you need a refill on your cardiac medications before your next appointment, please call your pharmacy.

## 2017-08-10 NOTE — Assessment & Plan Note (Signed)
History of minimal CAD by cath that I performed after a stress test showed subtle anteroapical ischemia on 06/24/2009 that showed a 30% hypodense LAD stenosis.  He does have a family history of heart disease.  He was recently seen in the ER 07/05/2017 with atypical chest pain.  EKG was unremarkable and his enzymes were negative.  He had no recurrent symptoms.

## 2017-08-28 ENCOUNTER — Ambulatory Visit: Payer: 59 | Admitting: Family Medicine

## 2017-09-10 ENCOUNTER — Encounter: Payer: Self-pay | Admitting: Internal Medicine

## 2017-09-10 ENCOUNTER — Ambulatory Visit: Payer: 59 | Admitting: Internal Medicine

## 2017-09-10 VITALS — BP 118/86 | HR 95 | Temp 98.0°F | Resp 16 | Ht 77.0 in | Wt 331.2 lb

## 2017-09-10 DIAGNOSIS — Z6838 Body mass index (BMI) 38.0-38.9, adult: Secondary | ICD-10-CM | POA: Diagnosis not present

## 2017-09-10 DIAGNOSIS — E6609 Other obesity due to excess calories: Secondary | ICD-10-CM | POA: Diagnosis not present

## 2017-09-10 DIAGNOSIS — E785 Hyperlipidemia, unspecified: Secondary | ICD-10-CM | POA: Diagnosis not present

## 2017-09-10 LAB — LIPID PANEL
CHOL/HDL RATIO: 3
Cholesterol: 105 mg/dL (ref 0–200)
HDL: 34.6 mg/dL — ABNORMAL LOW (ref >39.00)
LDL Cholesterol: 50 mg/dL (ref 0–99)
NonHDL: 70.67
Triglycerides: 103 mg/dL (ref 0.0–149.0)
VLDL: 20.6 mg/dL (ref 0.0–40.0)

## 2017-09-10 LAB — ALT: ALT: 32 U/L (ref 0–53)

## 2017-09-10 LAB — AST: AST: 19 U/L (ref 0–37)

## 2017-09-10 NOTE — Patient Instructions (Signed)
GO TO THE LAB : Get the blood work     GO TO THE FRONT DESK  Schedule your next appointment for a  Physical exam in 3-4 months     

## 2017-09-10 NOTE — Progress Notes (Signed)
Subjective:    Patient ID: Dennis Castro, male    DOB: [birth year], 57 y.o.   MRN: [id number]  DOS:  09/10/2017 Type of visit - description : Follow-up Interval history: Since the last visit, he saw cardiology, note reviewed. Good compliance with cholesterol medication  Review of Systems Denies chest pain or difficulty breathing No nausea, vomiting, diarrhea. Occasional lower extremity edema, mostly at the ankles at the end of the day.  Past Medical History:  Diagnosis Date  . Arthritis   . Atypical chest pain   . Cholelithiasis 09/2015   determined by CT, asymptomatic  . Diverticulitis   . Family history of heart disease   . GERD (gastroesophageal reflux disease)   . Hyperlipidemia   . Seasonal allergies     Past Surgical History:  Procedure Laterality Date  . CARDIAC CATHETERIZATION  2010   Dr. Allyson Sabal; less than 30% occlusive disease  . TONSILLECTOMY     age 44    Social History   Socioeconomic History  . Marital status: Married    Spouse name: Not on file  . Number of children: 1  . Years of education: Not on file  . Highest education level: Not on file  Occupational History  . Occupation: Production designer, theatre/television/film, office job  Social Needs  . Financial resource strain: Not on file  . Food insecurity:    Worry: Not on file    Inability: Not on file  . Transportation needs:    Medical: Not on file    Non-medical: Not on file  Tobacco Use  . Smoking status: Never Smoker  . Smokeless tobacco: Never Used  Substance and Sexual Activity  . Alcohol use: No  . Drug use: No  . Sexual activity: Not on file  Lifestyle  . Physical activity:    Days per week: Not on file    Minutes per session: Not on file  . Stress: Not on file  Relationships  . Social connections:    Talks on phone: Not on file    Gets together: Not on file    Attends religious service: Not on file    Active member of club or organization: Not on file    Attends meetings of clubs or organizations: Not on  file    Relationship status: Not on file  . Intimate partner violence:    Fear of current or ex partner: Not on file    Emotionally abused: Not on file    Physically abused: Not on file    Forced sexual activity: Not on file  Other Topics Concern  . Not on file  Social History Narrative   Teenage son      Allergies as of 09/10/2017   No Known Allergies     Medication List        Accurate as of 09/10/17 10:30 AM. Always use your most recent med list.          aspirin EC 81 MG tablet Take 81 mg by mouth daily.   atorvastatin 20 MG tablet Commonly known as:  LIPITOR Take 1 tablet (20 mg total) by mouth at bedtime.   pantoprazole 40 MG tablet Commonly known as:  PROTONIX Take 1 tablet (40 mg total) by mouth daily before breakfast.   ranitidine 150 MG tablet Commonly known as:  ZANTAC Take 1 tablet (150 mg total) by mouth 2 (two) times daily. Reported on 09/21/2015   sildenafil 20 MG tablet Commonly known as:  REVATIO Take 2-3 tablets (  40-60 mg total) by mouth daily as needed.          Objective:   Physical Exam BP 118/86 (BP Location: Left Arm, Patient Position: Sitting, Cuff Size: Large)   Pulse 95   Temp 98 F (36.7 C) (Oral)   Resp 16   Ht 6\' 5"  (1.956 m)   Wt (!) 331 lb 3.2 oz (150.2 kg)   SpO2 95%   BMI 39.27 kg/m  General:   Well developed, overweight appearing; NAD.  HEENT:  Normocephalic . Face symmetric, atraumatic Lungs:  CTA B Normal respiratory effort, no intercostal retractions, no accessory muscle use. Heart: RRR,  no murmur.  No pretibial edema bilaterally  Skin: Not pale. Not jaundice Neurologic:  alert & oriented X3.  Speech normal, gait appropriate for age and unassisted Psych--  Cognition and judgment appear intact.  Cooperative with normal attention span and concentration.  Behavior appropriate. No anxious or depressed appearing.      Assessment & Plan:   Assessment   Hyperlipidemia before  GERD Obesity  CAD:    --Chest pain: Myoview stress test subtle anterior ischemia --Cardiac cath 06-24-09: Noncritical 30% LAD with subtle anteroapical HK. Diverticulitis Seasonal allergies +FH prostate ca (father age 72), CAD (mother age 64) GB-kidney  Stones  PLAN: Chest pain: Saw cardiology, they noted no additional symptoms, they recommended to increase Lipitor to 20 mg which he is doing. Hyperlipidemia: Started Lipitor 10 mg 2 months ago, then cardiology increased to 20 mg daily.  Good compliance and tolerance.  FLP, AST, ALT Obesity: Long discussion about it, encourage to start counting calories which work well for him before. Ankle pain: Mild persisting but no a major issue at this point RTC 3 or 4 months CPX

## 2017-09-10 NOTE — Assessment & Plan Note (Signed)
Chest pain: Saw cardiology, they noted no additional symptoms, they recommended to increase Lipitor to 20 mg which he is doing. Hyperlipidemia: Started Lipitor 10 mg 2 months ago, then cardiology increased to 20 mg daily.  Good compliance and tolerance.  FLP, AST, ALT Obesity: Long discussion about it, encourage to start counting calories which work well for him before. Ankle pain: Mild persisting but no a major issue at this point RTC 3 or 4 months CPX

## 2017-09-28 ENCOUNTER — Ambulatory Visit: Payer: 59 | Admitting: Family Medicine

## 2017-09-28 ENCOUNTER — Encounter: Payer: Self-pay | Admitting: Family Medicine

## 2017-09-28 DIAGNOSIS — M25572 Pain in left ankle and joints of left foot: Secondary | ICD-10-CM

## 2017-09-28 DIAGNOSIS — G8929 Other chronic pain: Secondary | ICD-10-CM

## 2017-09-28 NOTE — Progress Notes (Signed)
PCP and consultation requested by: Wanda PlumpPaz, Jose E, MD  Subjective:   HPI: Patient is a 57 y.o. male here for left ankle pain.  4/16: Patient reports back in December he accidentally inverted his left ankle during the snow storm. Felt like it popped and came back at that time. Never completely improved, continues to have feeling of weakness especially medially. Able to do things around house like mow the grass. History of prior sprains. Current pain is 0/10 - pain is more of a discomfort. No skin changes, numbness.  6/28: Patient reports he's improved since last visit. Some bad days. Better at night. Did ok at the beach walking on the sand. Using arch supports, occasionally doing home exercises. Not taking any medicines. No numbness.  Past Medical History:  Diagnosis Date  . Arthritis   . Atypical chest pain   . Cholelithiasis 09/2015   determined by CT, asymptomatic  . Diverticulitis   . Family history of heart disease   . GERD (gastroesophageal reflux disease)   . Hyperlipidemia   . Seasonal allergies     Current Outpatient Medications on File Prior to Visit  Medication Sig Dispense Refill  . aspirin EC 81 MG tablet Take 81 mg by mouth daily.    [name]. atorvastatin (LIPITOR) 20 MG tablet Take 1 tablet (20 mg total) by mouth at bedtime. 30 tablet 6  . pantoprazole (PROTONIX) 40 MG tablet Take 1 tablet (40 mg total) by mouth daily before breakfast. 30 tablet 6  . ranitidine (ZANTAC) 150 MG tablet Take 1 tablet (150 mg total) by mouth 2 (two) times daily. Reported on 09/21/2015 60 tablet 5  . sildenafil (REVATIO) 20 MG tablet Take 2-3 tablets (40-60 mg total) by mouth daily as needed. 30 tablet 5   Current Facility-Administered Medications on File Prior to Visit  Medication Dose Route Frequency Provider Last Rate Last Dose  . 0.9 %  sodium chloride infusion  500 mL Intravenous Continuous Armbruster, Willaim RayasSteven P, MD        Past Surgical History:  Procedure Laterality Date  .  CARDIAC CATHETERIZATION  2010   Dr. Allyson SabalBerry; less than 30% occlusive disease  . TONSILLECTOMY     age 62    No Known Allergies  Social History   Socioeconomic History  . Marital status: Married    Spouse name: Not on file  . Number of children: 1  . Years of education: Not on file  . Highest education level: Not on file  Occupational History  . Occupation: Production designer, theatre/television/filmmanager, office job  Social Needs  . Financial resource strain: Not on file  . Food insecurity:    Worry: Not on file    Inability: Not on file  . Transportation needs:    Medical: Not on file    Non-medical: Not on file  Tobacco Use  . Smoking status: Never Smoker  . Smokeless tobacco: Never Used  Substance and Sexual Activity  . Alcohol use: No  . Drug use: No  . Sexual activity: Not on file  Lifestyle  . Physical activity:    Days per week: Not on file    Minutes per session: Not on file  . Stress: Not on file  Relationships  . Social connections:    Talks on phone: Not on file    Gets together: Not on file    Attends religious service: Not on file    Active member of club or organization: Not on file    Attends meetings of clubs  or organizations: Not on file    Relationship status: Not on file  . Intimate partner violence:    Fear of current or ex partner: Not on file    Emotionally abused: Not on file    Physically abused: Not on file    Forced sexual activity: Not on file  Other Topics Concern  . 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 Diabetes Mother   . CAD Mother 62       CBAG  . Cancer Father        prostate cancer and panceatic cancer  . Breast cancer Maternal Grandmother   . Pancreatic cancer Paternal Grandfather   . Stroke Neg Hx   . Colon cancer Neg Hx     BP 130/82   Pulse 64   Ht 6\' 5"  (1.956 m)   Wt (!) 330 lb 12.8 oz (150 kg)   BMI 39.23 kg/m   Review of Systems: See HPI above.     Objective:  Physical  Exam:  Gen: NAD, comfortable in exam room  Left ankle: Pes planus, post tib dysfunction.  No other gross deformity, swelling, ecchymoses FROM with 5-/5 IR, 5/5 other motions. TTP minimally over anterior aspect of ankle joint.  No other tenderness. Negative ant drawer and talar tilt.   Thompsons test negative. NV intact distally.  Assessment & Plan:  1. Left ankle pain - 2/2 strain of post tib tendon with tendinitis/tenosynovitis.  Continue with arch support, encouraged to do home exercises more regularly.  Icing with aleve or ibuprofen if needed.  Consider PT, orthotics, nitro patches if not improving.  F/u prn.

## 2017-09-28 NOTE — Assessment & Plan Note (Signed)
2/2 strain of post tib tendon with tendinitis/tenosynovitis.  Continue with arch support, encouraged to do home exercises more regularly.  Icing with aleve or ibuprofen if needed.  Consider PT, orthotics, nitro patches if not improving.  F/u prn.

## 2017-09-28 NOTE — Patient Instructions (Signed)
You have posterior tibial tendinopathy following a strain. Icing, aleve or ibuprofen only if needed. Continue with arch support until pain has resolved. Try to do your strengthening exercises 4 times a week, 3 sets of 10 once a day. Consider physical therapy, orthotics, nitro patches if you don't continue to improve. Follow up with me as needed.

## 2017-10-02 IMAGING — DX DG CHEST 2V
2 series · 2 of 2 positions shown · non-contrast
Comparison: 01/16/2012

CLINICAL DATA: Fever for 5 days.  Chills.

EXAM:
CHEST  2 VIEW

[chest pa]
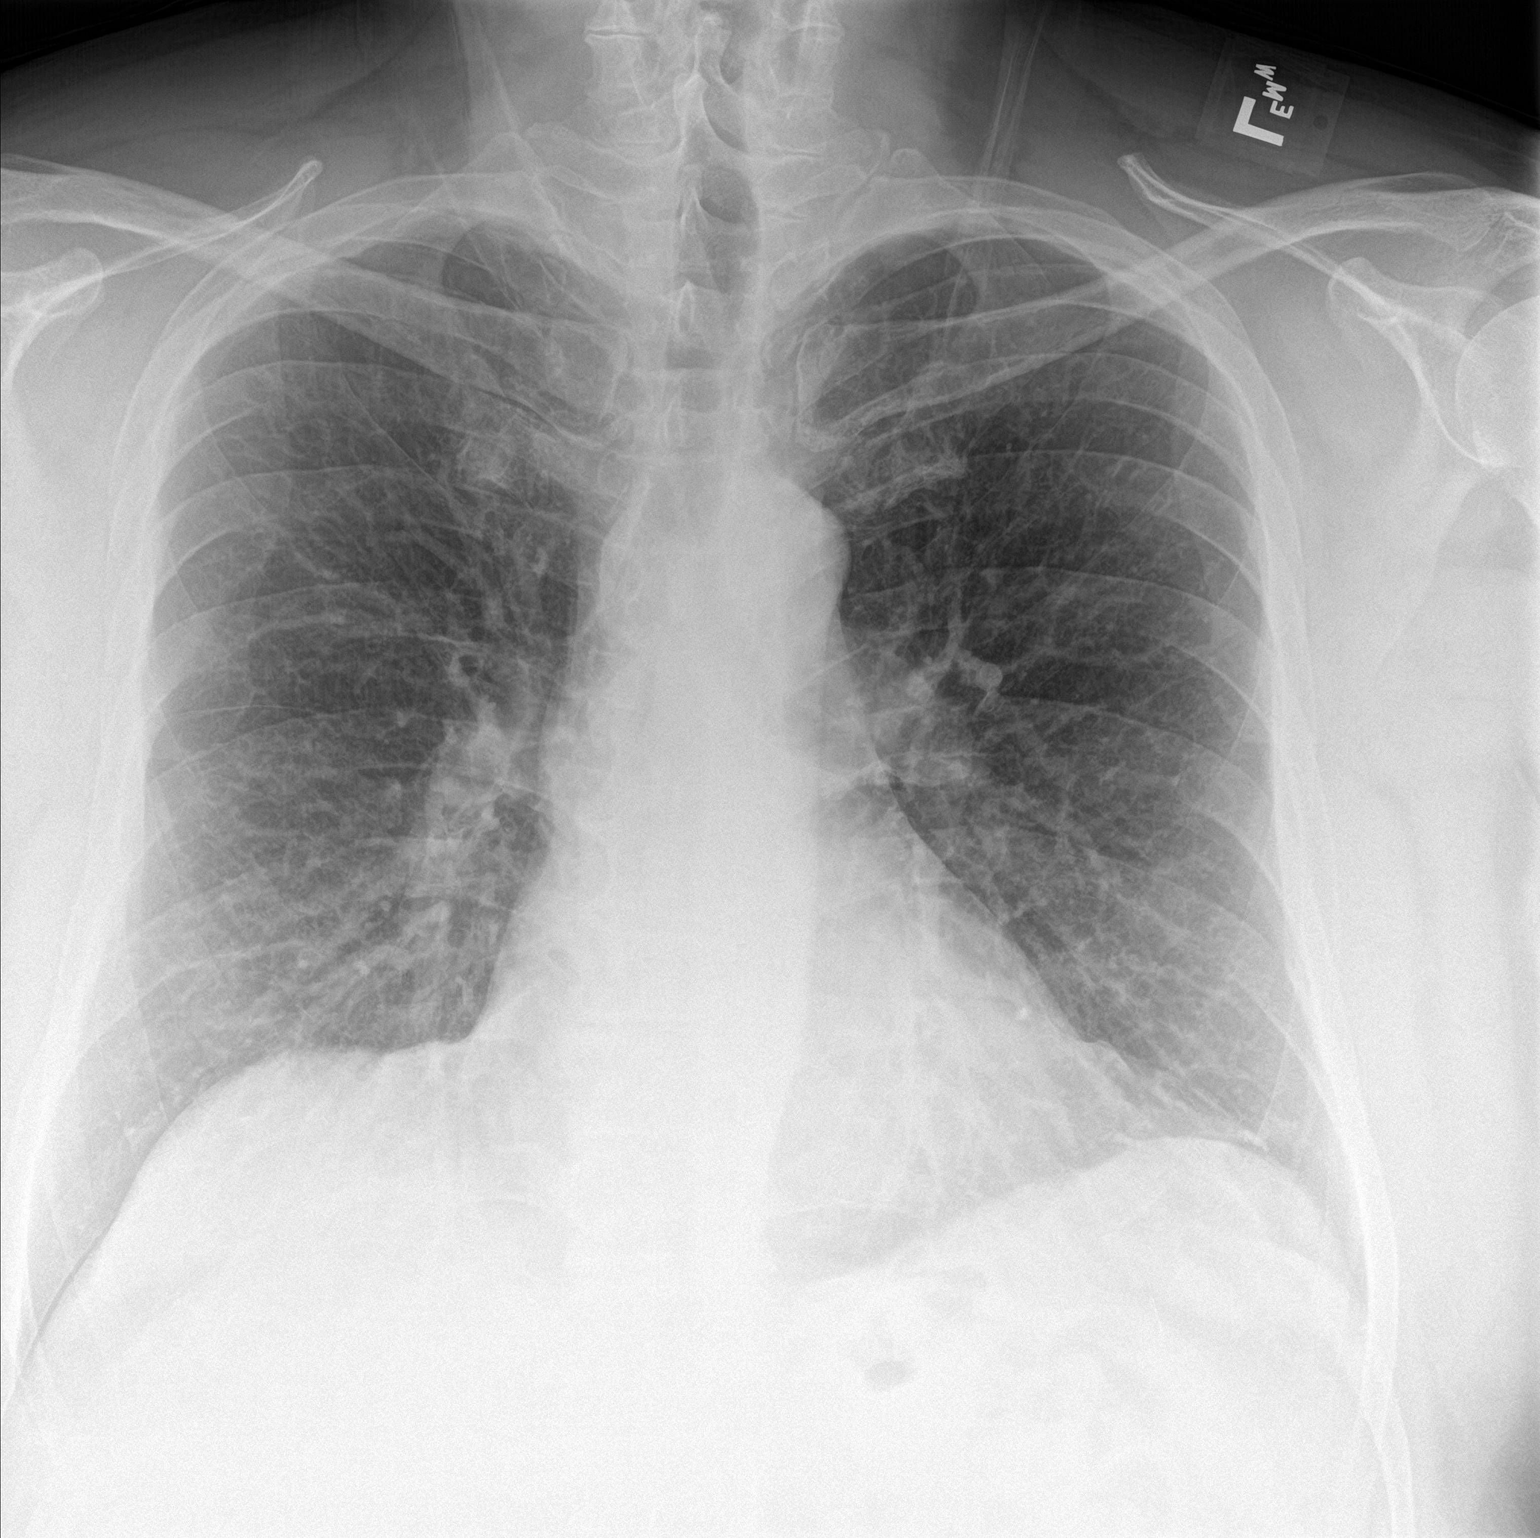

[chest lat]
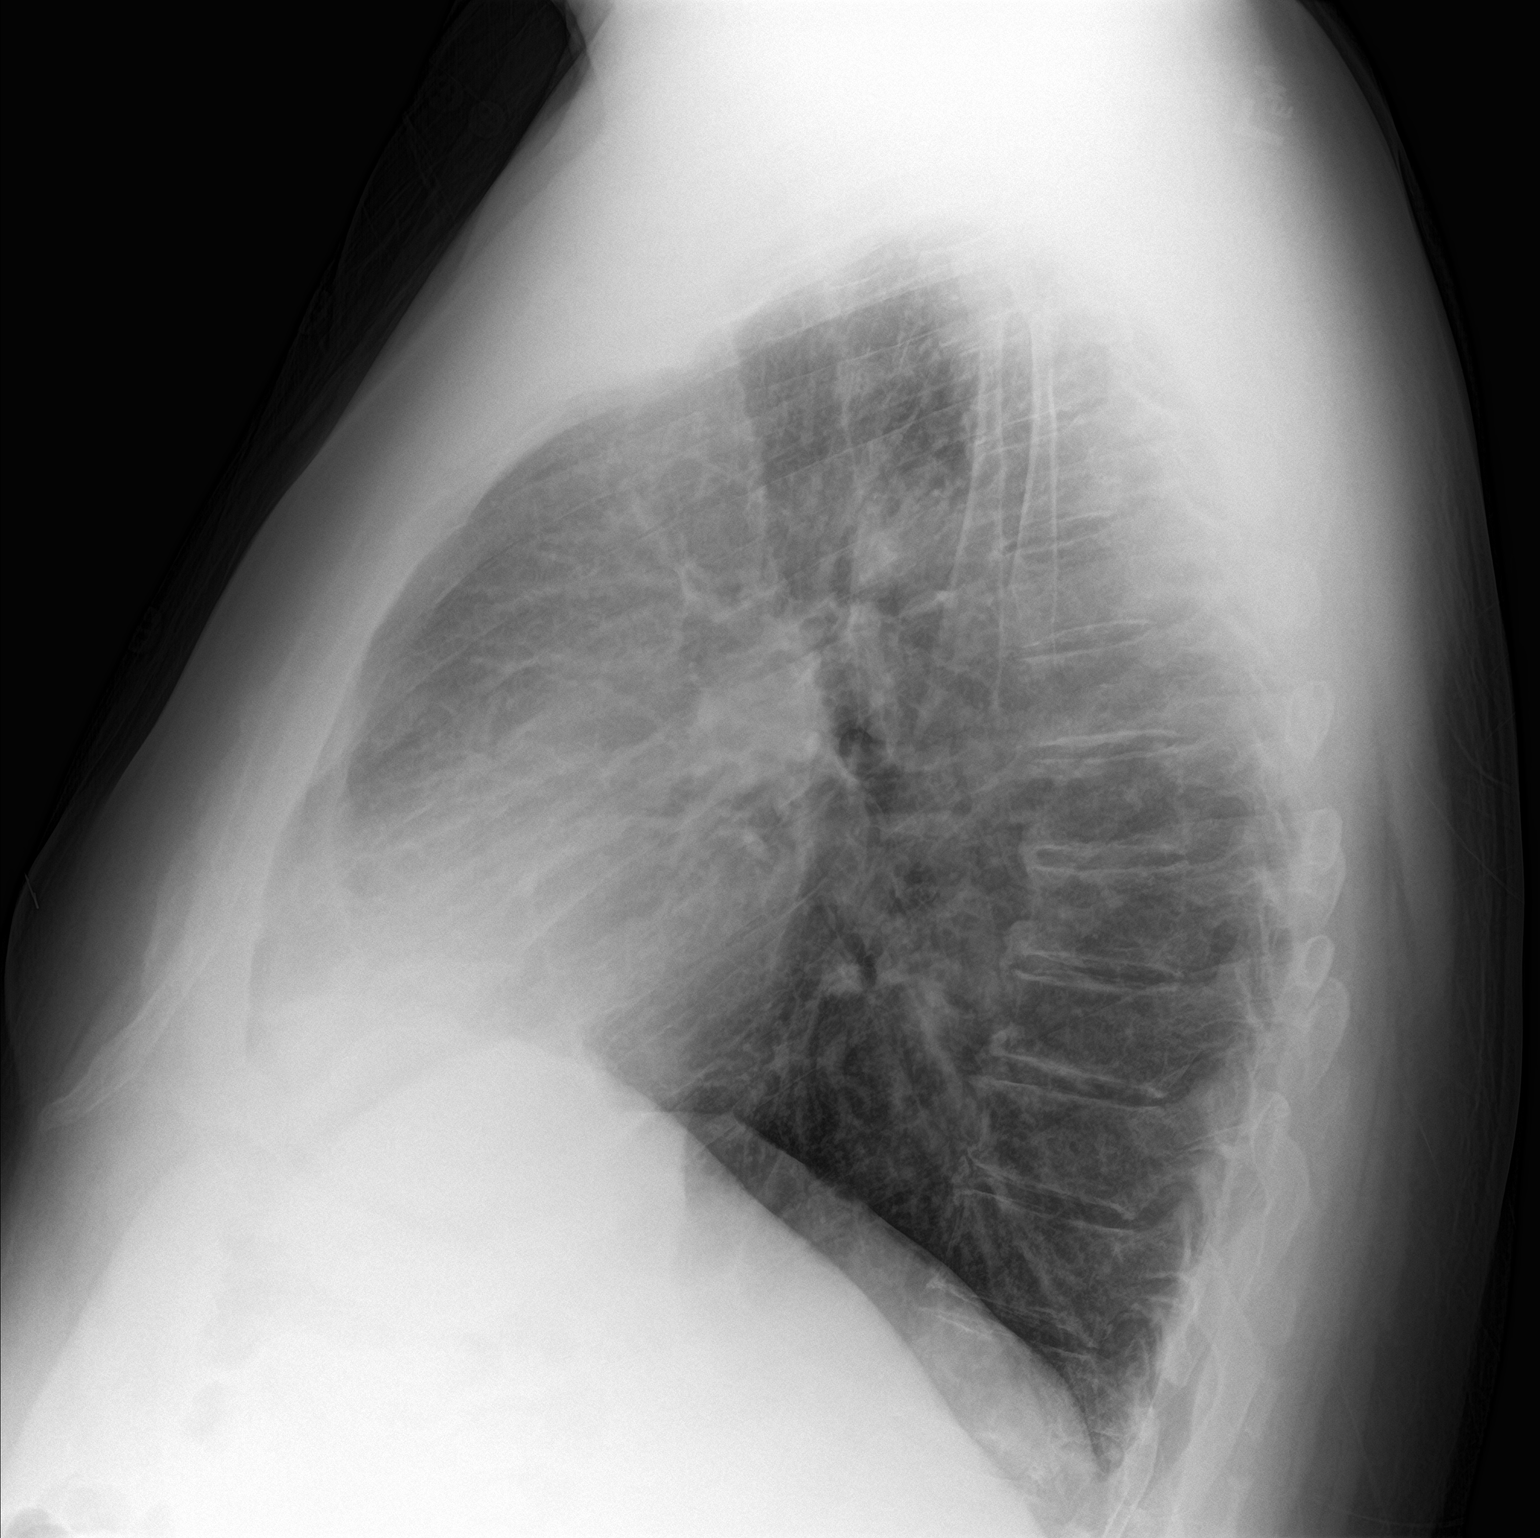

[2 of 2 positions shown; findings below may reference images not displayed]

FINDINGS: Mild peribronchial thickening. Heart and mediastinal contours are
within normal limits. No focal opacities or effusions. No acute bony
abnormality.
IMPRESSION: Mild bronchitic changes.

## 2018-01-04 DIAGNOSIS — H25043 Posterior subcapsular polar age-related cataract, bilateral: Secondary | ICD-10-CM | POA: Diagnosis not present

## 2018-01-15 DIAGNOSIS — Z800 Family history of malignant neoplasm of digestive organs: Secondary | ICD-10-CM | POA: Insufficient documentation

## 2018-01-16 ENCOUNTER — Ambulatory Visit (INDEPENDENT_AMBULATORY_CARE_PROVIDER_SITE_OTHER): Payer: 59 | Admitting: Internal Medicine

## 2018-01-16 ENCOUNTER — Encounter: Payer: Self-pay | Admitting: Internal Medicine

## 2018-01-16 VITALS — BP 134/76 | HR 78 | Temp 98.0°F | Resp 16 | Ht 77.0 in | Wt 332.4 lb

## 2018-01-16 DIAGNOSIS — Z0000 Encounter for general adult medical examination without abnormal findings: Secondary | ICD-10-CM

## 2018-01-16 DIAGNOSIS — Z23 Encounter for immunization: Secondary | ICD-10-CM | POA: Diagnosis not present

## 2018-01-16 DIAGNOSIS — Z125 Encounter for screening for malignant neoplasm of prostate: Secondary | ICD-10-CM

## 2018-01-16 DIAGNOSIS — Z1159 Encounter for screening for other viral diseases: Secondary | ICD-10-CM

## 2018-01-16 DIAGNOSIS — R7989 Other specified abnormal findings of blood chemistry: Secondary | ICD-10-CM

## 2018-01-16 LAB — CBC WITH DIFFERENTIAL/PLATELET
BASOS ABS: 0 10*3/uL (ref 0.0–0.1)
Basophils Relative: 0.6 % (ref 0.0–3.0)
EOS ABS: 0.2 10*3/uL (ref 0.0–0.7)
Eosinophils Relative: 3 % (ref 0.0–5.0)
HEMATOCRIT: 43.9 % (ref 39.0–52.0)
Hemoglobin: 14.7 g/dL (ref 13.0–17.0)
LYMPHS PCT: 25.2 % (ref 12.0–46.0)
Lymphs Abs: 1.7 10*3/uL (ref 0.7–4.0)
MCHC: 33.6 g/dL (ref 30.0–36.0)
MCV: 85.8 fl (ref 78.0–100.0)
MONOS PCT: 7.3 % (ref 3.0–12.0)
Monocytes Absolute: 0.5 10*3/uL (ref 0.1–1.0)
NEUTROS ABS: 4.3 10*3/uL (ref 1.4–7.7)
NEUTROS PCT: 63.9 % (ref 43.0–77.0)
PLATELETS: 287 10*3/uL (ref 150.0–400.0)
RBC: 5.11 Mil/uL (ref 4.22–5.81)
RDW: 13.7 % (ref 11.5–15.5)
WBC: 6.7 10*3/uL (ref 4.0–10.5)

## 2018-01-16 LAB — BASIC METABOLIC PANEL
BUN: 14 mg/dL (ref 6–23)
CHLORIDE: 107 meq/L (ref 96–112)
CO2: 27 meq/L (ref 19–32)
Calcium: 9.2 mg/dL (ref 8.4–10.5)
Creatinine, Ser: 0.96 mg/dL (ref 0.40–1.50)
GFR: 86.01 mL/min (ref >60.00)
GLUCOSE: 102 mg/dL — AB (ref 70–99)
POTASSIUM: 4.3 meq/L (ref 3.5–5.1)
SODIUM: 140 meq/L (ref 135–145)

## 2018-01-16 LAB — TSH: TSH: 1.6 u[IU]/mL (ref 0.35–4.50)

## 2018-01-16 LAB — PSA: PSA: 0.94 ng/mL (ref 0.10–4.00)

## 2018-01-16 NOTE — Progress Notes (Signed)
Subjective:    Patient ID: Dennis Castro, male    DOB: 1960-11-09, 57 y.o.   MRN: [id number]  DOS:  01/16/2018 Type of visit - description : cpx Interval history: In general feeling well, he did have few symptoms, see below   Review of Systems  For 2 weeks, she had some epigastric discomfort and bloating.  Worse with foods.  The pain was a steady and gradually decreased.  Symptom-free for 1 week. Had some nausea. Specifically denies vomiting, diarrhea, blood in the stools, fever or chills. Heartburn symptoms are on and off and at baseline with no odynophagia or dysphasia.  Other than above, a 14 point review of systems is negative    Past Medical History:  Diagnosis Date  . Arthritis   . Atypical chest pain   . Cholelithiasis 09/2015   determined by CT, asymptomatic  . Diverticulitis   . Family history of heart disease   . GERD (gastroesophageal reflux disease)   . Hyperlipidemia   . Seasonal allergies     Past Surgical History:  Procedure Laterality Date  . CARDIAC CATHETERIZATION  2010   Dr. Allyson Sabal; less than 30% occlusive disease  . TONSILLECTOMY     age 70    Social History   Socioeconomic History  . Marital status: Married    Spouse name: Not on file  . Number of children: 1  . Years of education: Not on file  . Highest education level: Not on file  Occupational History  . Occupation: Production designer, theatre/television/film, office job  Social Needs  . Financial resource strain: Not on file  . Food insecurity:    Worry: Not on file    Inability: Not on file  . Transportation needs:    Medical: Not on file    Non-medical: Not on file  Tobacco Use  . Smoking status: Never Smoker  . Smokeless tobacco: Never Used  Substance and Sexual Activity  . Alcohol use: No  . Drug use: No  . Sexual activity: Yes  Lifestyle  . Physical activity:    Days per week: Not on file    Minutes per session: Not on file  . Stress: Not on file  Relationships  . Social connections:    Talks on  phone: Not on file    Gets together: Not on file    Attends religious service: Not on file    Active member of club or organization: Not on file    Attends meetings of clubs or organizations: Not on file    Relationship status: Not on file  . Intimate partner violence:    Fear of current or ex partner: Not on file    Emotionally abused: Not on file    Physically abused: Not on file    Forced sexual activity: Not on file  Other Topics Concern  . 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 Diabetes Mother   . CAD Mother 57       CBAG  . Cancer Father        prostate cancer and panceatic cancer  . Breast cancer Maternal Grandmother   . Pancreatic cancer Paternal Grandfather   . Stroke Neg Hx   . Colon cancer Neg Hx      Allergies as of 01/16/2018   No Known Allergies     Medication List        Accurate as  of 01/16/18 11:59 PM. Always use your most recent med list.          aspirin EC 81 MG tablet Take 81 mg by mouth daily.   atorvastatin 20 MG tablet Commonly known as:  LIPITOR Take 1 tablet (20 mg total) by mouth at bedtime.   pantoprazole 40 MG tablet Commonly known as:  PROTONIX Take 1 tablet (40 mg total) by mouth daily before breakfast.   ranitidine 150 MG tablet Commonly known as:  ZANTAC Take 1 tablet (150 mg total) by mouth 2 (two) times daily. Reported on 09/21/2015   sildenafil 20 MG tablet Commonly known as:  REVATIO Take 2-3 tablets (40-60 mg total) by mouth daily as needed.          Objective:   Physical Exam BP 134/76 (BP Location: Left Arm, Patient Position: Sitting, Cuff Size: Normal)   Pulse 78   Temp 98 F (36.7 C) (Oral)   Resp 16   Ht 6\' 5"  (1.956 m)   Wt (!) 332 lb 6 oz (150.8 kg)   SpO2 98%   BMI 39.41 kg/m  General: Well developed, NAD, see BMI.  Neck: No  thyromegaly  HEENT:  Normocephalic . Face symmetric, atraumatic Lungs:  CTA B Normal respiratory  effort, no intercostal retractions, no accessory muscle use. Heart: RRR,  no murmur.  No pretibial edema bilaterally  Abdomen:  Not distended, soft, non-tender. No rebound or rigidity.   Rectal: External abnormalities: none. Normal sphincter tone. No rectal masses or tenderness.  No stools Prostate: Prostate gland: Exam limited by patient habitus.  Prostate seems normal. Skin: Exposed areas without rash. Not pale. Not jaundice Neurologic:  alert & oriented X3.  Speech normal, gait appropriate for age and unassisted Strength symmetric and appropriate for age.  Psych: Cognition and judgment appear intact.  Cooperative with normal attention span and concentration.  Behavior appropriate. No anxious or depressed appearing.     Assessment & Plan:   Assessment   Hyperlipidemia before  GERD Obesity  CAD:  --Chest pain: Myoview stress test subtle anterior ischemia --Cardiac cath 06-24-09: Noncritical 30% LAD with subtle anteroapical HK. Diverticulitis Seasonal allergies +FH prostate ca (father age 53), CAD (mother age 63) GB-kidney  Stones  PLAN: Here for a CPX Chronic medical problems seem stable. History of low testosterone: Occasionally has decreased libido and fatigue, checking levels. Upper abdominal pain: As described above, symptoms resolved, checking a CBC today.  Has a history of gallbladder stones, he will call if symptoms resurface. RTC 8 months

## 2018-01-16 NOTE — Assessment & Plan Note (Addendum)
-  Td  2016, flu shot today - [facility]: Colonoscopy-2014: Diverticuli, no polyps.  Next needs to be in 10 years Prostate cancer  screening: DRE wnl , check a PSA. Diet discussed, consider a visit to the bariatric office; Info provided Labs: BMP, CBC, TSH, hep C, PSA Father had pancreatic cancer at age 56, paternal grandfather had pancreatic cancer age 66.  Patient wonders about her screening, will discuss with GI (addendum: they rec genetic counselor referral)

## 2018-01-16 NOTE — Patient Instructions (Signed)
GO TO THE LAB : Get the blood work     GO TO THE FRONT DESK Schedule your next appointment for a   checkup in 8 months 

## 2018-01-16 NOTE — Progress Notes (Signed)
Pre visit review using our clinic review tool, if applicable. No additional management support is needed unless otherwise documented below in the visit note. 

## 2018-01-17 ENCOUNTER — Encounter: Payer: Self-pay | Admitting: Internal Medicine

## 2018-01-17 LAB — HEPATITIS C ANTIBODY
Hepatitis C Ab: NONREACTIVE
SIGNAL TO CUT-OFF: 0.01 (ref <1.00)

## 2018-01-17 NOTE — Assessment & Plan Note (Signed)
Here for a CPX Chronic medical problems seem stable. History of low testosterone: Occasionally has decreased libido and fatigue, checking levels. Upper abdominal pain: As described above, symptoms resolved, checking a CBC today.  Has a history of gallbladder stones, he will call if symptoms resurface. RTC 8 months

## 2018-01-18 ENCOUNTER — Telehealth: Payer: Self-pay | Admitting: *Deleted

## 2018-01-18 LAB — TESTOSTERONE,FREE AND TOTAL
Testosterone, Free: 6.1 pg/mL — ABNORMAL LOW (ref 7.2–24.0)
Testosterone: 263 ng/dL — ABNORMAL LOW (ref 264–916)

## 2018-01-18 NOTE — Telephone Encounter (Signed)
Received Lab Report results from [facility]; forwarded to provider/SLS 10/18  

## 2018-01-22 NOTE — Addendum Note (Signed)
Addended byConrad Oswego D on: 01/22/2018 08:52 AM   Modules accepted: Orders

## 2018-04-06 ENCOUNTER — Other Ambulatory Visit: Payer: Self-pay

## 2018-04-06 ENCOUNTER — Emergency Department (HOSPITAL_BASED_OUTPATIENT_CLINIC_OR_DEPARTMENT_OTHER)
Admission: EM | Admit: 2018-04-06 | Discharge: 2018-04-06 | Disposition: A | Discharge status: Home / Self Care | Payer: 59 | Attending: Emergency Medicine | Admitting: Emergency Medicine

## 2018-04-06 ENCOUNTER — Emergency Department (HOSPITAL_BASED_OUTPATIENT_CLINIC_OR_DEPARTMENT_OTHER): Payer: 59

## 2018-04-06 ENCOUNTER — Encounter (HOSPITAL_BASED_OUTPATIENT_CLINIC_OR_DEPARTMENT_OTHER): Payer: Self-pay

## 2018-04-06 DIAGNOSIS — Z79899 Other long term (current) drug therapy: Secondary | ICD-10-CM | POA: Insufficient documentation

## 2018-04-06 DIAGNOSIS — I2510 Atherosclerotic heart disease of native coronary artery without angina pectoris: Secondary | ICD-10-CM | POA: Insufficient documentation

## 2018-04-06 DIAGNOSIS — K8020 Calculus of gallbladder without cholecystitis without obstruction: Secondary | ICD-10-CM | POA: Diagnosis not present

## 2018-04-06 DIAGNOSIS — Z7982 Long term (current) use of aspirin: Secondary | ICD-10-CM | POA: Insufficient documentation

## 2018-04-06 DIAGNOSIS — R1011 Right upper quadrant pain: Secondary | ICD-10-CM | POA: Diagnosis not present

## 2018-04-06 DIAGNOSIS — K760 Fatty (change of) liver, not elsewhere classified: Secondary | ICD-10-CM | POA: Diagnosis not present

## 2018-04-06 LAB — COMPREHENSIVE METABOLIC PANEL
ALBUMIN: 4.3 g/dL (ref 3.5–5.0)
ALT: 26 U/L (ref 0–44)
AST: 17 U/L (ref 15–41)
Alkaline Phosphatase: 70 U/L (ref 38–126)
Anion gap: 8 (ref 5–15)
BILIRUBIN TOTAL: 0.9 mg/dL (ref 0.3–1.2)
BUN: 16 mg/dL (ref 6–20)
CALCIUM: 9.1 mg/dL (ref 8.9–10.3)
CO2: 23 mmol/L (ref 22–32)
CREATININE: 0.92 mg/dL (ref 0.61–1.24)
Chloride: 105 mmol/L (ref 98–111)
GFR calc Af Amer: >60 mL/min (ref >60)
GFR calc non Af Amer: >60 mL/min (ref >60)
Glucose, Bld: 97 mg/dL (ref 70–99)
Potassium: 3.9 mmol/L (ref 3.5–5.1)
Sodium: 136 mmol/L (ref 135–145)
Total Protein: 7.2 g/dL (ref 6.5–8.1)

## 2018-04-06 LAB — CBC WITH DIFFERENTIAL/PLATELET
Abs Immature Granulocytes: 0.04 10*3/uL (ref 0.00–0.07)
BASOS PCT: 1 %
Basophils Absolute: 0.1 10*3/uL (ref 0.0–0.1)
Eosinophils Absolute: 0.2 10*3/uL (ref 0.0–0.5)
Eosinophils Relative: 2 %
HCT: 48.2 % (ref 39.0–52.0)
Hemoglobin: 15.4 g/dL (ref 13.0–17.0)
Immature Granulocytes: 0 %
LYMPHS PCT: 17 %
Lymphs Abs: 2 10*3/uL (ref 0.7–4.0)
MCH: 27.9 pg (ref 26.0–34.0)
MCHC: 32 g/dL (ref 30.0–36.0)
MCV: 87.3 fL (ref 80.0–100.0)
Monocytes Absolute: 1.3 10*3/uL — ABNORMAL HIGH (ref 0.1–1.0)
Monocytes Relative: 11 %
NEUTROS ABS: 8.1 10*3/uL — AB (ref 1.7–7.7)
NRBC: 0 % (ref 0.0–0.2)
Neutrophils Relative %: 69 %
Platelets: 317 10*3/uL (ref 150–400)
RBC: 5.52 MIL/uL (ref 4.22–5.81)
RDW: 12.5 % (ref 11.5–15.5)
WBC: 11.6 10*3/uL — ABNORMAL HIGH (ref 4.0–10.5)

## 2018-04-06 LAB — URINALYSIS, ROUTINE W REFLEX MICROSCOPIC
Bilirubin Urine: NEGATIVE
Glucose, UA: NEGATIVE mg/dL
Ketones, ur: 15 mg/dL — AB
Leukocytes, UA: NEGATIVE
Nitrite: NEGATIVE
Protein, ur: NEGATIVE mg/dL
Specific Gravity, Urine: 1.02 (ref 1.005–1.030)
pH: 5.5 (ref 5.0–8.0)

## 2018-04-06 LAB — URINALYSIS, MICROSCOPIC (REFLEX): WBC, UA: NONE SEEN WBC/hpf (ref 0–5)

## 2018-04-06 LAB — LIPASE, BLOOD: Lipase: 25 U/L (ref 11–51)

## 2018-04-06 MED ORDER — ONDANSETRON HCL 4 MG PO TABS: 4.0000 mg | ORAL_TABLET | Freq: Four times a day (QID) | ORAL | 0 refills | Status: DC

## 2018-04-06 MED ORDER — HYDROCODONE-ACETAMINOPHEN 5-325 MG PO TABS: 1.0000 | ORAL_TABLET | Freq: Four times a day (QID) | ORAL | 0 refills | Status: DC | PRN

## 2018-04-06 NOTE — ED Triage Notes (Signed)
[facility] Sent for eval of RUQ pain. Started last night

## 2018-04-06 NOTE — Discharge Instructions (Addendum)
For severe pain, take Norco every 6 hours as needed.  Take Zofran every 6 hours as needed for nausea or vomiting.  Please follow-[facility] Dr. Drue [facility]Paz and[facility] for [facility] evaluation and treatment of your gallstones.  Please return the emergency department he develop any new or worsening symptoms.  Do not drink alcohol, drive, operate machinery or participate in any other potentially dangerous activities while taking opiate pain medication as it may make you sleepy. Do not take this medication with any other sedating medications, either prescription or over-the-counter. If you were prescribed Percocet or Vicodin, do not take these with acetaminophen (Tylenol) as it is already contained within these medications and overdose of Tylenol is dangerous.   This medication is an opiate (or narcotic) pain medication and can be habit forming.  Use it as little as possible to achieve adequate pain control.  Do not use or use it with extreme caution if you have a history of opiate abuse or dependence. This medication is intended for your use only - do not give any to anyone else and keep it in a secure place where nobody else, especially children, have access to it. It will also cause or worsen constipation, so you may want to consider taking an over-the-counter stool softener while you are taking this medication.

## 2018-04-06 NOTE — ED Notes (Signed)
ED Provider at bedside. 

## 2018-04-06 NOTE — ED Provider Notes (Signed)
[facility] EMERGENCY DEPARTMENT Provider Note   CSN: [id number] Arrival date & time: 04/06/18  1208     History   Chief Complaint Chief Complaint  Patient presents with  . Abdominal Pain    HPI Dennis Castro is a 58 y.o. male with history of hyperlipidemia, GERD, diverticulitis, cholelithiasis who presents with right upper quadrant pain that began at 10 PM last night.  It has been constant but waxing and waning all night.  He had some radiation of pain to his back at times, but none at this time.  He had some associated queasy feeling in his stomach, but denied any vomiting.  He denies any bloody stools or diarrhea.  He denies any chest pain, shortness of breath, fevers at home.  He did have a chill during the worst of his pain.  [facility] and sent here for further work-up of his gallbladder.  His temperature was 99.4 there.  He has been diagnosed with gallstones before, however has not had pain like this in the past.  No medications taken prior to arrival  HPI  Past Medical History:  Diagnosis Date  . Arthritis   . Atypical chest pain   . Cholelithiasis 09/2015   determined by CT, asymptomatic  . Diverticulitis   . Family history of heart disease   . GERD (gastroesophageal reflux disease)   . Hyperlipidemia   . Seasonal allergies     Patient Active Problem List   Diagnosis Date Noted  . Family history of pancreatic cancer 01/15/2018  . Coronary artery disease 08/10/2017  . Left ankle pain 07/20/2017  . Erectile dysfunction 03/21/2017  . Obesity 07/20/2015  . Annual physical exam 01/17/2015  . PCP NOTES >>> 01/17/2015  . Family history of prostate cancer 03/25/2012  . GERD 04/01/2010  . ANAL OR RECTAL PAIN 04/01/2010  . Hyperlipidemia 10/07/2007    Past Surgical History:  Procedure Laterality Date  . CARDIAC CATHETERIZATION  2010   Dr. Allyson SabalBerry; less than 30% occlusive disease  . TONSILLECTOMY     age 89         Home Medications    Prior to Admission medications   Medication Sig Start Date End Date Taking? Authorizing Provider  aspirin EC 81 MG tablet Take 81 mg by mouth daily.    [provider]  atorvastatin (LIPITOR) 20 MG tablet Take 1 tablet (20 mg total) by mouth at bedtime. 08/10/17   Runell GessBerry, Jonathan J, MD  HYDROcodone-acetaminophen (NORCO/VICODIN) 5-325 MG tablet Take 1-2 tablets by mouth every 6 (six) hours as needed. 04/06/18   Abbigayle Toole, Waylan BogaAlexandra M, PA-C  ondansetron (ZOFRAN) 4 MG tablet Take 1 tablet (4 mg total) by mouth every 6 (six) hours. 04/06/18   Brahm Barbeau, Waylan BogaAlexandra M, PA-C  pantoprazole (PROTONIX) 40 MG tablet Take 1 tablet (40 mg total) by mouth daily before breakfast. 07/09/17   Wanda PlumpPaz, Jose E, MD  ranitidine (ZANTAC) 150 MG tablet Take 1 tablet (150 mg total) by mouth 2 (two) times daily. Reported on 09/21/2015 11/25/15   Benancio DeedsArmbruster, Steven P, MD  sildenafil (REVATIO) 20 MG tablet Take 2-3 tablets (40-60 mg total) by mouth daily as needed. 03/20/17   Wanda PlumpPaz, Jose E, MD    Family History Family History  Problem Relation Age of Onset  . Diabetes Mother   . CAD Mother 4768       CBAG  . Cancer Father        prostate cancer  and panceatic cancer  . Breast cancer Maternal Grandmother   . Pancreatic cancer Paternal Grandfather   . Stroke Neg Hx   . Colon cancer Neg Hx     Social History Social History   Tobacco Use  . Smoking status: Never Smoker  . Smokeless tobacco: Never Used  Substance Use Topics  . Alcohol use: No  . Drug use: No     Allergies   Patient has no known allergies.   Review of Systems Review of Systems  Constitutional: Negative for chills and fever.  HENT: Negative for facial swelling and sore throat.   Respiratory: Negative for shortness of breath.   Cardiovascular: Negative for chest pain.  Gastrointestinal: Positive for abdominal pain and nausea. Negative for vomiting.  Genitourinary: Negative for dysuria.  Musculoskeletal: Positive for back  pain.  Skin: Negative for rash and wound.  Neurological: Negative for headaches.  Psychiatric/Behavioral: The patient is not nervous/anxious.      Physical Exam Updated Vital Signs BP 117/87 (BP Location: Left Arm)   Pulse 83   Temp 98.8 F (37.1 C) (Oral)   Resp 20   Ht 6\' 5"  (1.956 m)   Wt (!) 149.7 kg   SpO2 97%   BMI 39.13 kg/m   Physical Exam Vitals signs and nursing note reviewed.  Constitutional:      General: He is not in acute distress.    Appearance: He is well-developed. He is not diaphoretic.  HENT:     Head: Normocephalic and atraumatic.     Mouth/Throat:     Pharynx: No oropharyngeal exudate.  Eyes:     General: No scleral icterus.       Right eye: No discharge.        Left eye: No discharge.     Conjunctiva/sclera: Conjunctivae normal.     Pupils: Pupils are equal, round, and reactive to light.  Neck:     Musculoskeletal: Normal range of motion and neck supple.     Thyroid: No thyromegaly.  Cardiovascular:     Rate and Rhythm: Normal rate and regular rhythm.     Heart sounds: Normal heart sounds. No murmur. No friction rub. No gallop.   Pulmonary:     Effort: Pulmonary effort is normal. No respiratory distress.     Breath sounds: Normal breath sounds. No stridor. No wheezing or rales.  Abdominal:     General: Bowel sounds are normal. There is no distension.     Palpations: Abdomen is soft.     Tenderness: There is abdominal tenderness in the right upper quadrant. There is no right CVA tenderness, left CVA tenderness, guarding or rebound. Positive signs include Murphy's sign. Negative signs include Rovsing's sign and McBurney's sign.  Lymphadenopathy:     Cervical: No cervical adenopathy.  Skin:    General: Skin is warm and dry.     Coloration: Skin is not pale.     Findings: No rash.  Neurological:     Mental Status: He is alert.     Coordination: Coordination normal.      ED Treatments / Results  Labs (all labs ordered are listed, but  only abnormal results are displayed) Labs Reviewed  CBC WITH DIFFERENTIAL/PLATELET - Abnormal; Notable for the following components:      Result Value   WBC 11.6 (*)    Neutro Abs 8.1 (*)    Monocytes Absolute 1.3 (*)    All other components within normal limits  URINALYSIS, ROUTINE W REFLEX MICROSCOPIC - Abnormal;  Notable for the following components:   Hgb urine dipstick TRACE (*)    Ketones, ur 15 (*)    All other components within normal limits  URINALYSIS, MICROSCOPIC (REFLEX) - Abnormal; Notable for the following components:   Bacteria, UA FEW (*)    All other components within normal limits  COMPREHENSIVE METABOLIC PANEL  LIPASE, BLOOD    EKG None  Radiology [place] Abdomen Limited Ruq  Result Date: 04/06/2018 CLINICAL DATA:  Right upper quadrant pain over the last 3 days. EXAM: ULTRASOUND ABDOMEN LIMITED RIGHT UPPER QUADRANT COMPARISON:  CT 09/20/2015 FINDINGS: Gallbladder: Nonshadowing echogenic material dependent in the gallbladder consistent with either sludge or small nonshadowing stones, similar to the previous CT. No evidence of wall thickening or surrounding fluid. Negative Murphy sign. Common bile duct: Diameter: 3 mm.  Normal. Liver: Mild fatty change of the liver with focal sparing adjacent to the falciform ligament. Portal vein is patent on color Doppler imaging with normal direction of blood flow towards the liver. IMPRESSION: Small amount echogenic nonshadowing material dependent in the gallbladder consistent with the nonshadowing small stones or sludge, similar to the CT scan of 2017. No ultrasound findings of cholecystitis or obstruction. Mild fatty change of the liver. Electronically Signed   By: Paulina FusiMark  Shogry M.D.   On: 04/06/2018 13:49    Procedures Procedures (including critical care time)  Medications Ordered in ED Medications - No data to display   Initial Impression / Assessment and Plan / ED Course  I have reviewed the triage vital signs and the nursing  notes.  Pertinent labs & imaging results that were available during my care of the patient were reviewed by me and considered in my medical decision making (see chart for details).     Patient presenting with cholelithiasis.  Right upper quadrant ultrasound shows small amount of echogenic nonshadowing material dependent in the gallbladder consistent with nonshadowing small stones or sludge, no ultrasound findings of cholecystitis or obstruction.  His labs are unremarkable except for mild leukocytosis at WBC 11.6.  Patient with stable vitals in the ED.  He is afebrile.  On repeat abdominal exam, patient has no abdominal tenderness. It is soft.  Patient will be referred to general surgery and PCP for follow-up.  Patient be discharged home with short course of Norco, Zofran as needed.  I reviewed the [place] narcotic database and found no discrepancies.  Strict return precautions given.  Patient understands and agrees with plan.  Patient vitals stable throughout ED course and discharged in satisfactory condition. I discussed patient case with Dr. Adela LankFloyd who guided the patient's management and agrees with plan.   Final Clinical Impressions(s) / ED Diagnoses   Final diagnoses:  RUQ pain  Calculus of gallbladder without cholecystitis without obstruction    ED Discharge Orders         Ordered    HYDROcodone-acetaminophen (NORCO/VICODIN) 5-325 MG tablet  Every 6 hours PRN     04/06/18 1423    ondansetron (ZOFRAN) 4 MG tablet  Every 6 hours     04/06/18 1423           LawWaylan Boga, Sterlin Knightly M, PA-C 04/06/18 1424    Melene PlanFloyd, Dan, DO 04/06/18 1551

## 2018-04-09 ENCOUNTER — Encounter: Payer: Self-pay | Admitting: Internal Medicine

## 2018-04-09 ENCOUNTER — Ambulatory Visit: Payer: 59 | Admitting: Internal Medicine

## 2018-04-09 VITALS — BP 126/68 | HR 68 | Temp 97.8°F | Resp 16 | Ht 77.0 in | Wt 334.4 lb

## 2018-04-09 DIAGNOSIS — K8020 Calculus of gallbladder without cholecystitis without obstruction: Secondary | ICD-10-CM

## 2018-04-09 DIAGNOSIS — R7989 Other specified abnormal findings of blood chemistry: Secondary | ICD-10-CM | POA: Diagnosis not present

## 2018-04-09 DIAGNOSIS — Z800 Family history of malignant neoplasm of digestive organs: Secondary | ICD-10-CM | POA: Diagnosis not present

## 2018-04-09 DIAGNOSIS — M79672 Pain in left foot: Secondary | ICD-10-CM

## 2018-04-09 DIAGNOSIS — M79671 Pain in right foot: Secondary | ICD-10-CM

## 2018-04-09 NOTE — Progress Notes (Signed)
Pre visit review using our clinic review tool, if applicable. No additional management support is needed unless otherwise documented below in the visit note. 

## 2018-04-09 NOTE — Patient Instructions (Signed)
Please monitor  the skin lesions in your back, if any of them change, grow, bleed or looks irritated please let me know.  Mole A mole is a colored (pigmented) growth on the skin. Moles are very common. They are usually harmless, but some moles can become cancerous over time. What are the causes? Moles are caused when pigmented skin cells grow together in clusters instead of spreading out in the skin as they normally do. The reason why the skin cells grow together in clusters is not known. What increases the risk? You are more likely to develop a mole if you:  Have family members who have moles.  Are white.  Have blond hair.  Are often outdoors and exposed to the sun.  Received phototherapy when you were a newborn baby.  Are male. What are the signs or symptoms? A mole may be:  Manson PasseyBrown or black.  Flat or raised.  Smooth or wrinkled. How is this diagnosed? A mole is diagnosed with a skin exam. If your health care provider thinks a mole may be cancerous, all or part of the mole will be removed for testing (biopsy). How is this treated? Most moles are noncancerous (benign) and do not require treatment. If a mole is found to be cancerous, it will be removed. You may also choose to have a mole removed if it is causing pain or if you do not like the way it looks. Follow these instructions at home:  General instructions   Every month, look for new moles and check your existing moles for changes. This is important because a change in a mole can mean that the mole has become cancerous.  ABCDE changes in a mole indicate that you should be evaluated by your health care provider. ABCDE stands for: ? Asymmetry. This means the mole has an irregular shape. It is not round or oval. ? Border. This means the mole has an irregular or bumpy border. ? Color. This means the mole has multiple colors in it, including brown, black, blue, red, or tan. Note that it is normal for moles to get darker when  a woman is pregnant or takes birth control pills. ? Diameter. This means the mole is more than 0.2 inches (6 mm) across. ? Evolving. This refers to any unusual changes or symptoms in the mole, such as pain, itching, stinging, sensitivity, or bleeding.  If you have a large number of moles, see a skin doctor (dermatologist) at least one time every year for a full-body skin check. Lifestyle  When you are outdoors, wear sunscreen with SPF 30 (sun protection factor 30) or higher.  Use an adequate amount of sunscreen to cover exposed areas of skin. Put it on 30 minutes before you go out. Reapply it every 2 hours or anytime you come out of the water.  When you are out in the sun, wear a broad-brimmed hat and clothing that covers your arms and legs. Wear wraparound sunglasses. Contact a health care provider if:  The size, shape, borders, or color of your mole changes.  Your mole, or the skin near the mole, becomes painful, sore, red, or swollen.  Your mole: ? Develops more than one color. ? Itches or bleeds. ? Becomes scaly, sheds skin, or oozes fluid. ? Becomes flat or develops raised areas. ? Becomes hard or soft.  You develop a new mole. Summary  A mole is a colored (pigmented) growth on the skin. Moles are very common. They are usually harmless, but  some moles can become cancerous over time.  Every month, look for new moles and check your existing moles for changes. This is important because a change in a mole can mean that the mole has become cancerous.  If you have a large number of moles, see a skin doctor (dermatologist) at least one time every year for a full-body skin check.  When you are outdoors, wear sunscreen with SPF 30 (sun protection factor 30) or higher. Reapply it every 2 hours or anytime you come out of the water.  Contact a health care provider if you notice changes in a mole or if you develop a new mole. This information is not intended to replace advice given to  you by your health care provider. Make sure you discuss any questions you have with your health care provider. Document Released: 12/13/2000 Document Revised: 08/14/2017 Document Reviewed: 08/14/2017 Elsevier Interactive Patient Education  [employer].

## 2018-04-09 NOTE — Progress Notes (Signed)
Subjective:    Patient ID: Dennis Castro, male    DOB: 1960-04-10, 58 y.o.   MRN: [id number]  DOS:  04/09/2018 Type of visit - description: ED follow-up Patient went to the ER 04/06/2018, he presented with right upper quadrant abdominal pain, constant but waxing and waning all night. Mild nausea, no vomiting. CMP normal, lipase normal urinalysis negative, WBCs 11.6, ultrasound abdomen: Nonshadowing small stone or sludge.   Pt was released home with a plan to see surgery, symptoms felt to be d/t cholelithiasis  The patient confirms that the pain started 1 hour postprandially , lasted for few hours, he was initially burning and achy, at the end he felt like somebody punched him there. Pain was located slightly distal from the right upper quadrant. Since he left the ER he is asymptomatic.  He has a number of other concerns: -Skin lesion in the back -Feet pain : Sometimes at the metatarsal area, sometimes at the heels; he  started a walking program in the last few days but he had the pain before the walking program. -Concern about his family history of pancreatic cancer  Review of Systems Currently with no fever chills No nausea or vomiting No blood in the stools No recent dysuria, gross hematuria.  Past Medical History:  Diagnosis Date  . Arthritis   . Atypical chest pain   . Cholelithiasis 09/2015   determined by CT, asymptomatic  . Diverticulitis   . Family history of heart disease   . GERD (gastroesophageal reflux disease)   . Hyperlipidemia   . Seasonal allergies     Past Surgical History:  Procedure Laterality Date  . CARDIAC CATHETERIZATION  2010   Dr. Allyson Sabal; less than 30% occlusive disease  . TONSILLECTOMY     age 12    Social History   Socioeconomic History  . Marital status: Married    Spouse name: Not on file  . Number of children: 1  . Years of education: Not on file  . Highest education level: Not on file  Occupational History  . Occupation: Production designer, theatre/television/film,  office job  Social Needs  . Financial resource strain: Not on file  . Food insecurity:    Worry: Not on file    Inability: Not on file  . Transportation needs:    Medical: Not on file    Non-medical: Not on file  Tobacco Use  . Smoking status: Never Smoker  . Smokeless tobacco: Never Used  Substance and Sexual Activity  . Alcohol use: No  . Drug use: No  . Sexual activity: Yes  Lifestyle  . Physical activity:    Days per week: Not on file    Minutes per session: Not on file  . Stress: Not on file  Relationships  . Social connections:    Talks on phone: Not on file    Gets together: Not on file    Attends religious service: Not on file    Active member of club or organization: Not on file    Attends meetings of clubs or organizations: Not on file    Relationship status: Not on file  . Intimate partner violence:    Fear of current or ex partner: Not on file    Emotionally abused: Not on file    Physically abused: Not on file    Forced sexual activity: Not on file  Other Topics Concern  . 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 Always use your most recent med list.        aspirin EC 81 MG tablet Take 81 mg by mouth daily.   atorvastatin 20 MG tablet Commonly known as:  LIPITOR Take 1 tablet (20 mg total) by mouth at bedtime.   HYDROcodone-acetaminophen 5-325 MG tablet Commonly known as:  NORCO/VICODIN Take 1-2 tablets by mouth every 6 (six) hours as needed.   ondansetron 4 MG tablet Commonly known as:  ZOFRAN Take 1 tablet (4 mg total) by mouth every 6 (six) hours.   pantoprazole 40 MG tablet Commonly known as:  PROTONIX Take 1 tablet (40 mg total) by mouth daily before breakfast.   ranitidine 150 MG tablet Commonly known as:  ZANTAC Take 1 tablet (150 mg total) by mouth 2 (two) times daily. Reported on 09/21/2015     sildenafil 20 MG tablet Commonly known as:  REVATIO Take 2-3 tablets (40-60 mg total) by mouth daily as needed.           Objective:   Physical Exam Abdominal:      BP 126/68 (BP Location: Left Arm, Patient Position: Sitting, Cuff Size: Normal)   Pulse 68   Temp 97.8 F (36.6 C) (Oral)   Resp 16   Ht 6\' 5"  (1.956 m)   Wt (!) 334 lb 6 oz (151.7 kg)   SpO2 95%   BMI 39.65 kg/m  General:   Well developed, NAD, BMI noted.  HEENT:  Normocephalic . Face symmetric, atraumatic Lungs:  CTA B Normal respiratory effort, no intercostal retractions, no accessory muscle use. Heart: RRR,  no murmur.  no pretibial edema bilaterally  Abdomen:  Not distended, soft, non-tender. No rebound or rigidity.   Skin: Lesion of the patient's concern is located on the upper back and consistent with SK. MSK: Feet with no excessive arch, no TTP at this point.  Normal to inspection and palpation. Neurologic:  alert & oriented X3.  Speech normal, gait appropriate for age and unassisted Psych--  Cognition and judgment appear intact.  Cooperative with normal attention span and concentration.  Behavior appropriate. No anxious or depressed appearing.     Assessment     Assessment   Hyperlipidemia before  GERD Obesity  CAD:  --Chest pain: Myoview stress test subtle anterior ischemia --Cardiac cath 06-24-09: Noncritical 30% LAD with subtle anteroapical HK. Diverticulitis Seasonal allergies +FH prostate ca (father age 73), CAD (mother age 90) GB-kidney  Stones  PLAN: Right-sided abdominal pain: As described above, known history of cholelithiasis by previous CT, recent ultrasound show gallbladder sludge.  Although the pain is a slightly distal for a typical cholelithiasis pain, clinically symptoms are not consistent with urolithiasis or appendicitis thus will refer to general surgery.  Strict precautions to go back to the ER discussed Skin lesion: Likely SK, recommend self-monitoring.  If  there is any changes he will need a biopsy. Feet pain: Unclear etiology, probably related with his weight, we talked about the use of comfortable tennis shoes. We will call if not better, sports medicine referral? FH of pancreatic cancer: I sent him a message after the last visit, this is a challenging and changing field , he will benefit from talking to a geneticist, referral sent. Morbid obesity: Has changed his diet in the last week and is trying to be more active, praised. Low  testosterone: We will need to repeat a testosterone level at some point in the next few weeks. Patient aware.   F2F >> 25 min

## 2018-04-10 NOTE — Assessment & Plan Note (Addendum)
   Right-sided abdominal pain: As described above, known history of cholelithiasis by previous CT, recent ultrasound show gallbladder sludge.  Although the pain is a slightly distal for a typical cholelithiasis pain, clinically symptoms are not consistent with urolithiasis or appendicitis thus will refer to general surgery.  Strict precautions to go back to the ER discussed Skin lesion: Likely SK, recommend self-monitoring.  If there is any changes he will need a biopsy. Feet pain: Unclear etiology, probably related with his weight, we talked about the use of comfortable tennis shoes. We will call if not better, sports medicine referral? FH of pancreatic cancer: I sent him a message after the last visit, this is a challenging and changing field , he will benefit from talking to a geneticist, referral sent. Morbid obesity: Has changed his diet in the last week and is trying to be more active, praised. Low testosterone: We will need to repeat a testosterone level at some point in the next few weeks. Patient aware.

## 2018-04-16 ENCOUNTER — Ambulatory Visit: Payer: 59 | Admitting: Cardiology

## 2018-04-16 ENCOUNTER — Encounter: Payer: Self-pay | Admitting: Cardiology

## 2018-04-16 DIAGNOSIS — I2510 Atherosclerotic heart disease of native coronary artery without angina pectoris: Secondary | ICD-10-CM

## 2018-04-16 DIAGNOSIS — E785 Hyperlipidemia, unspecified: Secondary | ICD-10-CM | POA: Diagnosis not present

## 2018-04-16 MED ORDER — ATORVASTATIN CALCIUM 20 MG PO TABS: 20.0000 mg | ORAL_TABLET | Freq: Every day | ORAL | 3 refills | Status: DC

## 2018-04-16 NOTE — Progress Notes (Signed)
04/16/2018 Dennis Castro   07-May-1960  [id number]  Primary Physician Wanda Plump, MD Primary Cardiologist: Dr Allyson Sabal  HPI: Dennis Castro is seen today as a routine follow-up.  He is a pleasant 58 year old male followed by Dr. Allyson Sabal.  He has mild coronary disease documented by catheterization in 2011.  He had a 30% LAD narrowing.  I believe this was after an abnormal Myoview study.  Other medical issues include dyslipidemia, he is on low-dose statin therapy and his LDL was 50 in June 2019.  He does have morbid obesity.  His BMI is 39.  He tells me since January he is changed his diet and started exercising and has lost 10 pounds.  He was seen in the emergency room earlier in January as well for some abdominal pain.  He has known cholelithiasis and there is a possibility that he has cholecystitis.  He has an evaluation with a surgeon pending.  From a cardiac standpoint he is done well.  He is now walking for exercise and denies any chest pain or unusual dyspnea.  He does snore at night but he denies any daytime fatigue.  He tells me his wife is never reported any periods of apnea at night.  He had some pain in his feet and he wondered if this was from Lipitor and I explained that was probably not the case.   Current Outpatient Medications  Medication Sig Dispense Refill  . aspirin EC 81 MG tablet Take 81 mg by mouth daily.    [name] atorvastatin (LIPITOR) 20 MG tablet Take 1 tablet (20 mg total) by mouth at bedtime. 90 tablet 3  . HYDROcodone-acetaminophen (NORCO/VICODIN) 5-325 MG tablet Take 1-2 tablets by mouth every 6 (six) hours as needed. 8 tablet 0  . ondansetron (ZOFRAN) 4 MG tablet Take 1 tablet (4 mg total) by mouth every 6 (six) hours. 12 tablet 0  . pantoprazole (PROTONIX) 40 MG tablet Take 1 tablet (40 mg total) by mouth daily before breakfast. 30 tablet 6  . ranitidine (ZANTAC) 150 MG tablet Take 1 tablet (150 mg total) by mouth 2 (two) times daily. Reported on 09/21/2015 60 tablet 5  .  sildenafil (REVATIO) 20 MG tablet Take 2-3 tablets (40-60 mg total) by mouth daily as needed. 30 tablet 5   No current facility-administered medications for this visit.     No Known Allergies  Past Medical History:  Diagnosis Date  . Arthritis   . Atypical chest pain   . Cholelithiasis 09/2015   determined by CT, asymptomatic  . Diverticulitis   . Family history of heart disease   . GERD (gastroesophageal reflux disease)   . Hyperlipidemia   . Seasonal allergies     Social History   Socioeconomic History  . Marital status: Married    Spouse name: Not on file  . Number of children: 1  . Years of education: Not on file  . Highest education level: Not on file  Occupational History  . Occupation: Production designer, theatre/television/film, office job  Social Needs  . Financial resource strain: Not on file  . Food insecurity:    Worry: Not on file    Inability: Not on file  . Transportation needs:    Medical: Not on file    Non-medical: Not on file  Tobacco Use  . Smoking status: Never Smoker  . Smokeless tobacco: Never Used  Substance and Sexual Activity  . Alcohol use: No  . Drug use: No  . Sexual activity: Yes  Lifestyle  . Physical activity:    Days per week: Not on file    Minutes per session: Not on file  . Stress: Not on file  Relationships  . Social connections:    Talks on phone: Not on file    Gets together: Not on file    Attends religious service: Not on file    Active member of club or organization: Not on file    Attends meetings of clubs or organizations: Not on file    Relationship status: Not on file  . Intimate partner violence:    Fear of current or ex partner: Not on file    Emotionally abused: Not on file    Physically abused: Not on file    Forced sexual activity: Not on file  Other Topics Concern  . 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 Diabetes Mother   . CAD Mother 59       CBAG  . Cancer  Father        prostate cancer and panceatic cancer  . Breast cancer Maternal Grandmother   . Pancreatic cancer Paternal Grandfather   . Stroke Neg Hx   . Colon cancer Neg Hx      Review of Systems: General: negative for chills, fever, night sweats or weight changes.  Cardiovascular: negative for chest pain, dyspnea on exertion, edema, orthopnea, palpitations, paroxysmal nocturnal dyspnea or shortness of breath Dermatological: negative for rash Respiratory: negative for cough or wheezing Urologic: negative for hematuria Abdominal: negative for nausea, vomiting, diarrhea, bright red blood per rectum, melena, or hematemesis Neurologic: negative for visual changes, syncope, or dizziness All other systems reviewed and are otherwise negative except as noted above.    Blood pressure 114/74, pulse 66, height 6\' 5"  (1.956 m), weight (!) 326 lb (147.9 kg).  General appearance: alert, cooperative, no distress and morbidly obese Neck: no carotid bruit and no JVD Lungs: clear to auscultation bilaterally Heart: regular rate and rhythm Extremities: no edema Skin: Skin color, texture, turgor normal. No rashes or lesions Neurologic: Grossly normal  EKG NSR  ASSESSMENT AND PLAN:   Coronary artery disease 30% LAD 2011 (after an abnormal Myoview)  Hyperlipidemia LDL 50 June 2019  Morbid obesity (HCC) BMI 39- he has lost 10 lbs in two week with diet and exercise   PLAN  Same cardiac Rx for now. I applauded him on his weight loss efforts.  His is obese and snores but I'm not convinced he has sleep apnea so will hold off on evaluating this for now.  We did discuss signs and symptoms of sleep apnea at length and he'll let us know if anything changes.   If he does end up needing cholecystectomy he would be an acceptable risk from a cardiac standpoint without further work up.  F/U Dr Allyson Sabal in 6 months.  Corine Shelter PA-C 04/16/2018 4:21 PM

## 2018-04-16 NOTE — Assessment & Plan Note (Signed)
LDL 50 June 2019

## 2018-04-16 NOTE — Assessment & Plan Note (Signed)
30% LAD 2011 (after an abnormal Myoview)

## 2018-04-16 NOTE — Patient Instructions (Signed)
Medication Instructions:  Your physician recommends that you continue on your current medications as directed. Please refer to the Current Medication list given to you today. If you need a refill on your cardiac medications before your next appointment, please call your pharmacy.   Lab work: None  If you have labs (blood work) drawn today and your tests are completely normal, you will receive your results only by: . MyChart Message (if you have MyChart) OR . A paper copy in the mail If you have any lab test that is abnormal or we need to change your treatment, we will call you to review the results.  Testing/Procedures: None   Follow-Up: At [facility], you and your health needs are our priority.  As part of our continuing mission to provide you with exceptional heart care, we have created designated Provider Care Teams.  These Care Teams include your primary Cardiologist (physician) and Advanced Practice Providers (APPs -  Physician Assistants and Nurse Practitioners) who all work together to provide you with the care you need, when you need it. You will need a follow up appointment in 6 months.  Please call our office 2 months in advance to schedule this appointment.  You may see Dr Jonathan Berry or one of the following Advanced Practice Providers on your designated Care Team:   Luke Kilroy, PA-C Krista Kroeger, PA-C . Callie Goodrich, PA-C  Any Other Special Instructions Will Be Listed Below (If Applicable).   

## 2018-04-16 NOTE — Assessment & Plan Note (Signed)
BMI 39- he has lost 10 lbs in two week with diet and exercise

## 2018-05-06 ENCOUNTER — Ambulatory Visit: Payer: Self-pay | Admitting: General Surgery

## 2018-05-06 DIAGNOSIS — K8020 Calculus of gallbladder without cholecystitis without obstruction: Secondary | ICD-10-CM | POA: Diagnosis not present

## 2018-05-06 NOTE — H&P (Signed)
History of Present Illness Dennis Filler MD; 05/06/2018 11:15 AM) The patient is a 58 year old male who presents for evaluation of gall stones. Referred by: Dr. Porfirio Oar Chief Complaint: Gallstones  Patient is a 58 year old male, with a history of hyperlipidemia, GERD, CAD, comes in with right upper quadrant pain. Patient states that in January of this year he had some right sided abdominal pain. He states this was overnight was fairly intense. Patient states he had no nausea, vomiting, diarrhea or constipation at time. He describes pain as burning and stabbing in nature. The patient states that secondary to continue pain proceeded to the ER. Upon evaluation ER ultrasound did reveal gallstones. Review this personally. Patient also laboratory studies which revealed normal LFTs. Secondary to the above findings abscess patient is referred for further evaluation and management.    Past Surgical History Santiago Glad, CMA; 05/06/2018 11:02 AM) No pertinent past surgical history   Diagnostic Studies History Santiago Glad, [place]; 05/06/2018 11:02 AM) Colonoscopy  5-10 years ago  Allergies Santiago Glad, CMA; 05/06/2018 11:04 AM) No Known Drug Allergies [05/06/2018]: Allergies Reconciled   Medication History Santiago Glad, CMA; 05/06/2018 11:04 AM) Atorvastatin Calcium (20MG  Tablet, Oral) Active. Medications Reconciled  Social History Santiago Glad, [place]; 05/06/2018 11:02 AM) Caffeine use  Coffee, Tea. No alcohol use  No drug use  Tobacco use  Never smoker.  Family History Santiago Glad, [place]; 05/06/2018 11:02 AM) Colon Cancer  Mother. Diabetes Mellitus  Mother. Heart Disease  Mother. Malignant Neoplasm Of Pancreas  Father. Prostate Cancer  Father.  Other Problems Santiago Glad, CMA; 05/06/2018 11:02 AM) Cholelithiasis  Gastroesophageal Reflux Disease     Review of Systems Dennis Filler MD; 05/06/2018 11:14 AM) General Not Present- Appetite Loss,  Chills, Fatigue, Fever, Night Sweats, Weight Gain and Weight Loss. Skin Not Present- Change in Wart/Mole, Dryness, Hives, Jaundice, New Lesions, Non-Healing Wounds, Rash and Ulcer. HEENT Present- Wears glasses/contact lenses. Not Present- Earache, Hearing Loss, Hoarseness, Nose Bleed, Oral Ulcers, Ringing in the Ears, Seasonal Allergies, Sinus Pain, Sore Throat, Visual Disturbances and Yellow Eyes. Respiratory Not Present- Bloody sputum, Chronic Cough, Difficulty Breathing, Snoring and Wheezing. Breast Not Present- Breast Mass, Breast Pain, Nipple Discharge and Skin Changes. Cardiovascular Not Present- Chest Pain, Difficulty Breathing Lying Down, Leg Cramps, Palpitations, Rapid Heart Rate, Shortness of Breath and Swelling of Extremities. Gastrointestinal Not Present- Abdominal Pain, Bloating, Bloody Stool, Change in Bowel Habits, Chronic diarrhea, Constipation, Difficulty Swallowing, Excessive gas, Gets full quickly at meals, Hemorrhoids, Indigestion, Nausea, Rectal Pain and Vomiting. Male Genitourinary Not Present- Blood in Urine, Change in Urinary Stream, Frequency, Impotence, Nocturia, Painful Urination, Urgency and Urine Leakage. Musculoskeletal Not Present- Back Pain, Joint Pain, Joint Stiffness, Muscle Pain, Muscle Weakness and Swelling of Extremities. Neurological Not Present- Decreased Memory, Fainting, Headaches, Numbness, Seizures, Tingling, Tremor, Trouble walking and Weakness. Psychiatric Not Present- Anxiety, Bipolar, Change in Sleep Pattern, Depression, Fearful and Frequent crying. Endocrine Not Present- Cold Intolerance, Excessive Hunger, Hair Changes, Heat Intolerance, Hot flashes and New Diabetes. Hematology Not Present- Blood Thinners, Easy Bruising, Excessive bleeding, Gland problems, HIV and Persistent Infections. All other systems negative  Vitals Santiago Glad CMA; 05/06/2018 11:03 AM) 05/06/2018 11:02 AM Weight: 317.8 lb Height: 67in Body Surface Area: 2.46 m Body Mass  Index: 49.77 kg/m  Temp.: 97.58F  Pulse: 72 (Regular)  BP: 134/78 (Sitting, Left Arm, Standard)       Physical Exam Dennis Filler MD; 05/06/2018 11:15 AM) The physical exam findings are as follows: Note:Constitutional: No acute distress, conversant, appears  stated age  Eyes: Anicteric sclerae, moist conjunctiva, no lid lag  Neck: No thyromegaly, trachea midline, no cervical lymphadenopathy  Lungs: Clear to auscultation biilaterally, normal respiratory effot  Cardiovascular: regular rate & rhythm, no murmurs, no peripheal edema, pedal pulses 2+  GI: Soft, no masses or hepatosplenomegaly, non-tender to palpation  MSK: Normal gait, no clubbing cyanosis, edema  Skin: No rashes, palpation reveals normal skin turgor  Psychiatric: Appropriate judgment and insight, oriented to person, place, and time    Assessment & Plan Dennis Filler MD; 05/06/2018 11:16 AM) SYMPTOMATIC CHOLELITHIASIS (K80.20) Impression: 58 year old male with symptomatic cholelithiasis  1. We will proceed to the operating room for a laparoscopic cholecystectomy  2. Risks and benefits were discussed with the patient to generally include, but not limited to: infection, bleeding, possible need for post op ERCP, damage to the bile ducts, bile leak, and possible need for further surgery. Alternatives were offered and described. All questions were answered and the patient voiced understanding of the procedure and wishes to proceed at this point with a laparoscopic cholecystectomy

## 2018-06-06 NOTE — Pre-Procedure Instructions (Signed)
TEIGEN BORELLO  06/06/2018      [facility] - [place], [place] - [address] [place] [address] Phone: [phone] Fax: [phone]    Your procedure is scheduled on March 13th.  Report to [facility] Entrance "A" Admitting at 5:30 A.M.  Call this number if you have problems the morning of surgery:  [phone]   Remember:  Do not eat or drink after midnight.    7 days prior to surgery STOP taking any Aspirin (unless otherwise instructed by your surgeon), Aleve, Naproxen, Ibuprofen, Motrin, Advil, Goody's, BC's, all herbal medications, fish oil, and all vitamins.     Do not wear jewelry.  Do not wear lotions, powders, colognes, or deodorant.  Do not shave 48 hours prior to surgery.  Men may shave face and neck.  Do not bring valuables to the hospital.  [facility] is not responsible for any belongings or valuables.   [facility]- Preparing For Surgery  Before surgery, you can play an important role. Because skin is not sterile, your skin needs to be as free of germs as possible. You can reduce the number of germs on your skin by washing with CHG (chlorahexidine gluconate) Soap before surgery.  CHG is an antiseptic cleaner which kills germs and bonds with the skin to continue killing germs even after washing.    Oral Hygiene is also important to reduce your risk of infection.  Remember - BRUSH YOUR TEETH THE MORNING OF SURGERY WITH YOUR REGULAR TOOTHPASTE  Please do not use if you have an allergy to CHG or antibacterial soaps. If your skin becomes reddened/irritated stop using the CHG.  Do not shave (including legs and underarms) for at least 48 hours prior to first CHG shower. It is OK to shave your face.  Please follow these instructions carefully.   1. Shower the NIGHT BEFORE SURGERY and the MORNING OF SURGERY with CHG.   2. If you chose to wash your hair, wash your hair first as usual with your normal shampoo.  3. After you  shampoo, rinse your hair and body thoroughly to remove the shampoo.  4. Use CHG as you would any other liquid soap. You can apply CHG directly to the skin and wash gently with a scrungie or a clean washcloth.   5. Apply the CHG Soap to your body ONLY FROM THE NECK DOWN.  Do not use on open wounds or open sores. Avoid contact with your eyes, ears, mouth and genitals (private parts). Wash Face and genitals (private parts)  with your normal soap.  6. Wash thoroughly, paying special attention to the area where your surgery will be performed.  7. Thoroughly rinse your body with warm water from the neck down.  8. DO NOT shower/wash with your normal soap after using and rinsing off the CHG Soap.  9. Pat yourself dry with a CLEAN TOWEL.  10. Wear CLEAN PAJAMAS to bed the night before surgery, wear comfortable clothes the morning of surgery  11. Place CLEAN SHEETS on your bed the night of your first shower and DO NOT SLEEP WITH PETS.   Day of Surgery:  Do not apply any deodorants/lotions.  Please wear clean clothes to the hospital/surgery center.   Remember to brush your teeth WITH YOUR REGULAR TOOTHPASTE.   Contacts, dentures or bridgework may not be worn into surgery.  Leave your suitcase in the car.  After surgery it may be brought to your room.  For patients admitted to the hospital, discharge time will be determined by your treatment team.  Patients discharged the day of surgery will not be allowed to drive home.    Please read over the following fact sheets that you were given. Coughing and Deep Breathing and Surgical Site Infection Prevention

## 2018-06-07 ENCOUNTER — Encounter (HOSPITAL_COMMUNITY): Payer: Self-pay

## 2018-06-07 ENCOUNTER — Encounter (HOSPITAL_COMMUNITY)
Admission: RE | Admit: 2018-06-07 | Discharge: 2018-06-07 | Disposition: A | Discharge status: Home / Self Care | Payer: 59 | Source: Ambulatory Visit | Attending: General Surgery | Admitting: General Surgery

## 2018-06-07 ENCOUNTER — Other Ambulatory Visit: Payer: Self-pay

## 2018-06-07 DIAGNOSIS — Z01812 Encounter for preprocedural laboratory examination: Secondary | ICD-10-CM | POA: Insufficient documentation

## 2018-06-07 LAB — CBC
HEMATOCRIT: 45.1 % (ref 39.0–52.0)
HEMOGLOBIN: 14.6 g/dL (ref 13.0–17.0)
MCH: 28.3 pg (ref 26.0–34.0)
MCHC: 32.4 g/dL (ref 30.0–36.0)
MCV: 87.4 fL (ref 80.0–100.0)
Platelets: 285 10*3/uL (ref 150–400)
RBC: 5.16 MIL/uL (ref 4.22–5.81)
RDW: 12.5 % (ref 11.5–15.5)
WBC: 9.9 10*3/uL (ref 4.0–10.5)
nRBC: 0 % (ref 0.0–0.2)

## 2018-06-07 NOTE — Progress Notes (Signed)
PCP - [place] Cardiologist - [place]  Chest x-ray: 07-05-17 EKG: 04-16-18  Cardiac Cath - 2010  Anesthesia review: yes, heart history  Patient denies shortness of breath, fever, cough and chest pain at PAT appointment   Patient verbalized understanding of instructions that were given to them at the PAT appointment. Patient was also instructed that they will need to review over the PAT instructions again at home before surgery.

## 2018-06-10 NOTE — Progress Notes (Signed)
Anesthesia Chart Review:  Case:  [id number] Date/Time:  06/14/18 0715   Procedure:  LAPAROSCOPIC CHOLECYSTECTOMY (N/A )   Anesthesia type:  General   Pre-op diagnosis:  gallstones   Location:  [facility] ROOM 02 / [facility]   Surgeon:  Axel Filler, MD      DISCUSSION: Patient is a 58 year old male scheduled for the above procedure.  History includes never smoker, GERD, HLD, atypical chest pain (LHC 30% LAD 06/24/08, after abnormal Myoview). BMI is consistent with obesity (but has been making lifestyle changes with weight down 10 lbs in January).  He was doing well from a cardiac standpoint at his last visit 04/16/18 by Corine Shelter, PA-C. He wrote, "If he does end up needing cholecystectomy he would be an acceptable risk from a cardiac standpoint without further work up."  He denied chest pain and SOB at [employer] RN visit. If no acute changes then I would anticipate that he can proceed as planned.   VS: BP 129/75   Pulse 60   Temp 36.6 C   Resp 20   Ht 6\' 5"  (1.956 m)   Wt (!) 141 kg   SpO2 94%   BMI 36.86 kg/m    PROVIDERS: Wanda Plump, MD is PCP Nanetta Batty, MD is cardiologist. Last visit 04/16/18 with Corine Shelter, PA-C.   LABS: Labs reviewed: Acceptable for surgery. CMET on 04/06/18 was WNL.  (all labs ordered are listed, but only abnormal results are displayed)  Labs Reviewed  CBC    IMAGES: CXR 07/05/17: IMPRESSION: No edema or consolidation.   EKG: 04/15/18: NSR   CV: Cardiac cath 06/24/08 Allyson Sabal, Christiane Ha, MD): SELECTIVE CHOLANGIOGRAPHY:  1. Left main normal.  2. LAD; the LAD had a 30% hypodense segmental stenosis in the mid      third of the vessel.  3. Left circumflex; free of significant disease.  4. Ramus branch; small in size and free of significant disease.  5. Right coronary artery; dominant and free of significant disease.  6. Left ventriculography: The overall LVEF was      estimated at approximately 50-55% with very subtle anteroapical       hypokinesia.  IMPRESSION:  Mr. Hope has noncritical coronary artery disease with  mild segmental mid left anterior descending disease and mild  anteroapical hypokinesia.  While this could conceivably be flow limiting  lesion, at this point, I am going to treat him medically with beta  blockers, statin drugs, and risk factor modification.  If indeed he  continues to have chest pain consistent with angina.  He may require  intravascular ultrasound-guided percutaneous coronary intervention  stenting of his mid-left anterior descending.   Past Medical History:  Diagnosis Date  . Arthritis   . Atypical chest pain   . Cholelithiasis 09/2015   determined by CT, asymptomatic  . Diverticulitis   . Family history of heart disease   . GERD (gastroesophageal reflux disease)   . Hyperlipidemia   . Seasonal allergies     Past Surgical History:  Procedure Laterality Date  . CARDIAC CATHETERIZATION  2010   Dr. Allyson Sabal; less than 30% occlusive disease  . TONSILLECTOMY     age 57    MEDICATIONS: . aspirin EC 81 MG tablet  . atorvastatin (LIPITOR) 20 MG tablet  . sildenafil (REVATIO) 20 MG tablet   No current facility-administered medications for this encounter.     Shonna Chock, PA-C Surgical Short Stay/Anesthesiology [facility] Phone [phone] [facility] Phone [phone] 06/10/2018  11:28 AM

## 2018-06-10 NOTE — Anesthesia Preprocedure Evaluation (Addendum)
Anesthesia Evaluation  Patient identified by MRN, date of birth, ID band Patient awake    Reviewed: Allergy & Precautions, NPO status , Patient's Chart, lab work & pertinent test results  Airway Mallampati: II       Dental no notable dental hx. (+) Teeth Intact   Pulmonary neg pulmonary ROS,    Pulmonary exam normal breath sounds clear to auscultation       Cardiovascular + CAD  Normal cardiovascular exam Rhythm:Regular Rate:Normal     Neuro/Psych negative neurological ROS  negative psych ROS   GI/Hepatic Neg liver ROS,   Endo/Other  negative endocrine ROS  Renal/GU negative Renal ROS  negative genitourinary   Musculoskeletal   Abdominal (+) + obese,   Peds  Hematology negative hematology ROS (+)   Anesthesia Other Findings Jerold Coombe, PA-C Physician Assistant Anesthesiology Progress Notes   Signed Date of Service:  06/07/2018 3:00 PM     Signed        Show: ManualTemplateCopied  Added by: Jerold Coombe, PA-C  Hover for details Anesthesia Chart Review:   Case:  [id number] Date/Time:  06/14/18 0715  Procedure:  LAPAROSCOPIC CHOLECYSTECTOMY (N/A )  Anesthesia type:  General  Pre-op diagnosis:  gallstones  [facility]  [facility]  Surgeon:  Axel Filler, MD    DISCUSSION: Patient is a 58 year old male scheduled for the above procedure.  History includes never smoker, GERD, HLD, atypical chest pain (LHC 30% LAD 06/24/08, after abnormal Myoview). BMI is consistent with obesity (but has been making lifestyle changes with weight down 10 lbs in January).  He was doing well from a cardiac standpoint at his last visit 04/16/18 by Corine Shelter, PA-C. He wrote, "If he does end up needing cholecystectomy he would be an acceptable risk from a cardiac standpoint without further work up."  He denied chest pain and SOB at [employer] RN visit. If no acute changes then I would  anticipate that he can proceed as planned.   VS: BP 129/75   Pulse 60   Temp 36.6 C   Resp 20   Ht 6\' 5"  (1.956 m)   Wt (!) 141 kg   SpO2 94%   BMI 36.86 kg/m    PROVIDERS: Wanda Plump, MD is PCP Nanetta Batty, MD is cardiologist. Last visit 04/16/18 with Corine Shelter, PA-C.   LABS: Labs reviewed: Acceptable for surgery. CMET on 04/06/18 was WNL.  (all labs ordered are listed, but only abnormal results are displayed)   Labs Reviewed CBC   IMAGES: CXR 07/05/17: IMPRESSION: No edema or consolidation.   EKG: 04/15/18: NSR   CV: Cardiac cath 06/24/08 Allyson Sabal, Christiane Ha, MD): SELECTIVE CHOLANGIOGRAPHY: 1. Left main normal. 2. LAD; the LAD had a 30% hypodense segmental stenosis in the mid third of the vessel. 3. Left circumflex; free of significant disease. 4. Ramus branch; small in size and free of significant disease. 5. Right coronary artery; dominant and free of significant disease. 6. Left ventriculography:The overall LVEF was estimated at approximately 50-55% with very subtle anteroapical hypokinesia. IMPRESSION: Mr. Dimarco has noncritical coronary artery disease with mild segmental mid left anterior descending disease and mild anteroapical hypokinesia. While this could conceivably be flow limiting lesion, at this point, I am going to treat him medically with beta blockers, statin drugs, and risk factor modification. If indeed he continues to have chest pain consistent with angina. He may require intravascular ultrasound-guided percutaneous coronary intervention stenting of his mid-left anterior descending.  Past Medical History: Diagnosis Date . Arthritis  . Atypical chest pain  . Cholelithiasis 09/2015  determined by CT, asymptomatic . Diverticulitis  . Family history of heart disease  . GERD (gastroesophageal reflux disease)  . Hyperlipidemia  . Seasonal allergies     Past Surgical  History: Procedure Laterality Date . CARDIAC CATHETERIZATION  2010  Dr. Allyson Sabal; less than 30% occlusive disease . TONSILLECTOMY    age 37   MEDICATIONS:  . aspirin EC 81 MG tablet . atorvastatin (LIPITOR) 20 MG tablet . sildenafil (REVATIO) 20 MG tablet   No current facility-administered medications for this encounter.    Shonna Chock, PA-C Surgical Short Stay/Anesthesiology [facility] Phone [phone] [facility] Phone [phone] 06/10/2018 11:28 AM          Electronically signed by Jerold Coombe, PA-C at 06/10/2018 11:29 AM    Pre-Admission Testing 60 on 06/07/2018     Detailed Report    Note shared with patient    Reproductive/Obstetrics                           Anesthesia Physical Anesthesia Plan  ASA: III  Anesthesia Plan: General   Post-op Pain Management:    Induction: Intravenous  PONV Risk Score and Plan: 4 or greater and Ondansetron, Dexamethasone and Midazolam  Airway Management Planned: Oral ETT  Additional Equipment:   Intra-op Plan:   Post-operative Plan: Extubation in OR  Informed Consent: I have reviewed the patients History and Physical, chart, labs and discussed the procedure including the risks, benefits and alternatives for the proposed anesthesia with the patient or authorized representative who has indicated his/her understanding and acceptance.     Dental advisory given  Plan Discussed with: CRNA  Anesthesia Plan Comments: (PAT note written 06/10/2018 by Shonna Chock, PA-C. )      Anesthesia Quick Evaluation

## 2018-06-13 ENCOUNTER — Encounter (HOSPITAL_COMMUNITY): Payer: Self-pay | Admitting: Anesthesiology

## 2018-06-13 MED ORDER — DEXTROSE 5 % IV SOLN
3.0000 g | INTRAVENOUS | Status: DC
  Filled 2018-06-13: qty 3000

## 2018-06-14 ENCOUNTER — Other Ambulatory Visit: Payer: Self-pay

## 2018-06-14 ENCOUNTER — Encounter (HOSPITAL_COMMUNITY)
Admission: RE | Disposition: A | Discharge status: Home / Self Care | Payer: Self-pay | Source: Ambulatory Visit | Attending: General Surgery

## 2018-06-14 ENCOUNTER — Encounter (HOSPITAL_COMMUNITY): Payer: Self-pay | Admitting: *Deleted

## 2018-06-14 ENCOUNTER — Ambulatory Visit (HOSPITAL_COMMUNITY): Payer: 59 | Admitting: Anesthesiology

## 2018-06-14 ENCOUNTER — Ambulatory Visit (HOSPITAL_COMMUNITY): Payer: 59 | Admitting: Vascular Surgery

## 2018-06-14 ENCOUNTER — Ambulatory Visit (HOSPITAL_COMMUNITY)
Admission: RE | Admit: 2018-06-14 | Discharge: 2018-06-14 | Disposition: A | Discharge status: Home / Self Care | Payer: 59 | Source: Ambulatory Visit | Attending: General Surgery | Admitting: General Surgery

## 2018-06-14 DIAGNOSIS — K8010 Calculus of gallbladder with chronic cholecystitis without obstruction: Secondary | ICD-10-CM | POA: Insufficient documentation

## 2018-06-14 DIAGNOSIS — Z79899 Other long term (current) drug therapy: Secondary | ICD-10-CM | POA: Insufficient documentation

## 2018-06-14 DIAGNOSIS — K8080 Other cholelithiasis without obstruction: Secondary | ICD-10-CM | POA: Diagnosis present

## 2018-06-14 DIAGNOSIS — I2510 Atherosclerotic heart disease of native coronary artery without angina pectoris: Secondary | ICD-10-CM | POA: Insufficient documentation

## 2018-06-14 DIAGNOSIS — E785 Hyperlipidemia, unspecified: Secondary | ICD-10-CM | POA: Diagnosis not present

## 2018-06-14 DIAGNOSIS — K8020 Calculus of gallbladder without cholecystitis without obstruction: Secondary | ICD-10-CM | POA: Diagnosis not present

## 2018-06-14 HISTORY — PX: CHOLECYSTECTOMY: SHX55

## 2018-06-14 SURGERY — LAPAROSCOPIC CHOLECYSTECTOMY
Anesthesia: General | Site: Abdomen

## 2018-06-14 MED ORDER — MIDAZOLAM HCL 2 MG/2ML IJ SOLN
INTRAMUSCULAR | Status: AC
  Filled 2018-06-14: qty 2

## 2018-06-14 MED ORDER — IBUPROFEN 100 MG/5ML PO SUSP: 200.0000 mg | Freq: Four times a day (QID) | ORAL | Status: DC | PRN

## 2018-06-14 MED ORDER — DEXAMETHASONE SODIUM PHOSPHATE 10 MG/ML IJ SOLN
INTRAMUSCULAR | Status: DC | PRN
  Administered 2018-06-14: 5 mg via INTRAVENOUS

## 2018-06-14 MED ORDER — CEFAZOLIN SODIUM-DEXTROSE 2-4 GM/100ML-% IV SOLN
INTRAVENOUS | Status: AC
  Filled 2018-06-14: qty 100

## 2018-06-14 MED ORDER — BUPIVACAINE HCL (PF) 0.25 % IJ SOLN
INTRAMUSCULAR | Status: AC
  Filled 2018-06-14: qty 30

## 2018-06-14 MED ORDER — OXYCODONE HCL 5 MG/5ML PO SOLN: 5.0000 mg | Freq: Once | ORAL | Status: AC | PRN

## 2018-06-14 MED ORDER — GABAPENTIN 300 MG PO CAPS: 300.0000 mg | ORAL_CAPSULE | ORAL | Status: DC

## 2018-06-14 MED ORDER — ONDANSETRON HCL 4 MG/2ML IJ SOLN
INTRAMUSCULAR | Status: AC
  Filled 2018-06-14: qty 2

## 2018-06-14 MED ORDER — CELECOXIB 200 MG PO CAPS
ORAL_CAPSULE | ORAL | Status: AC
  Administered 2018-06-14: 200 mg
  Filled 2018-06-14: qty 1

## 2018-06-14 MED ORDER — PROMETHAZINE HCL 25 MG/ML IJ SOLN: 6.2500 mg | INTRAMUSCULAR | Status: DC | PRN

## 2018-06-14 MED ORDER — FENTANYL CITRATE (PF) 100 MCG/2ML IJ SOLN
INTRAMUSCULAR | Status: DC | PRN
  Administered 2018-06-14: 25 ug via INTRAVENOUS
  Administered 2018-06-14: 150 ug via INTRAVENOUS
  Administered 2018-06-14: 50 ug via INTRAVENOUS
  Administered 2018-06-14: 25 ug via INTRAVENOUS

## 2018-06-14 MED ORDER — DEXAMETHASONE SODIUM PHOSPHATE 10 MG/ML IJ SOLN
INTRAMUSCULAR | Status: AC
  Filled 2018-06-14: qty 1

## 2018-06-14 MED ORDER — HYDROMORPHONE HCL 1 MG/ML IJ SOLN: 0.2500 mg | INTRAMUSCULAR | Status: DC | PRN

## 2018-06-14 MED ORDER — LACTATED RINGERS IV SOLN
INTRAVENOUS | Status: DC | PRN
  Administered 2018-06-14: 07:00:00 via INTRAVENOUS

## 2018-06-14 MED ORDER — PROPOFOL 10 MG/ML IV BOLUS
INTRAVENOUS | Status: AC
  Filled 2018-06-14: qty 20

## 2018-06-14 MED ORDER — ACETAMINOPHEN 500 MG PO TABS
1000.0000 mg | ORAL_TABLET | ORAL | Status: AC
  Administered 2018-06-14: 1000 mg via ORAL
  Filled 2018-06-14: qty 2

## 2018-06-14 MED ORDER — FENTANYL CITRATE (PF) 250 MCG/5ML IJ SOLN
INTRAMUSCULAR | Status: AC
  Filled 2018-06-14: qty 5

## 2018-06-14 MED ORDER — ROCURONIUM BROMIDE 50 MG/5ML IV SOSY
PREFILLED_SYRINGE | INTRAVENOUS | Status: AC
  Filled 2018-06-14: qty 5

## 2018-06-14 MED ORDER — CHLORHEXIDINE GLUCONATE CLOTH 2 % EX PADS: 6.0000 | MEDICATED_PAD | Freq: Once | CUTANEOUS | Status: DC

## 2018-06-14 MED ORDER — MIDAZOLAM HCL 5 MG/5ML IJ SOLN
INTRAMUSCULAR | Status: DC | PRN
  Administered 2018-06-14: 2 mg via INTRAVENOUS

## 2018-06-14 MED ORDER — GABAPENTIN 300 MG PO CAPS
ORAL_CAPSULE | ORAL | Status: AC
  Administered 2018-06-14: 300 mg
  Filled 2018-06-14: qty 1

## 2018-06-14 MED ORDER — OXYCODONE HCL 5 MG PO TABS
ORAL_TABLET | ORAL | Status: AC
  Filled 2018-06-14: qty 1

## 2018-06-14 MED ORDER — PROPOFOL 10 MG/ML IV BOLUS
INTRAVENOUS | Status: DC | PRN
  Administered 2018-06-14: 200 mg via INTRAVENOUS

## 2018-06-14 MED ORDER — TRAMADOL HCL 50 MG PO TABS: 50.0000 mg | ORAL_TABLET | Freq: Four times a day (QID) | ORAL | 0 refills | Status: DC | PRN

## 2018-06-14 MED ORDER — ROCURONIUM BROMIDE 50 MG/5ML IV SOSY
PREFILLED_SYRINGE | INTRAVENOUS | Status: DC | PRN
  Administered 2018-06-14: 50 mg via INTRAVENOUS
  Administered 2018-06-14: 20 mg via INTRAVENOUS

## 2018-06-14 MED ORDER — ONDANSETRON HCL 4 MG/2ML IJ SOLN
INTRAMUSCULAR | Status: DC | PRN
  Administered 2018-06-14: 4 mg via INTRAVENOUS

## 2018-06-14 MED ORDER — MEPERIDINE HCL 50 MG/ML IJ SOLN: 6.2500 mg | INTRAMUSCULAR | Status: DC | PRN

## 2018-06-14 MED ORDER — ONDANSETRON HCL 4 MG/2ML IJ SOLN
4.0000 mg | Freq: Once | INTRAMUSCULAR | Status: AC | PRN
  Administered 2018-06-14: 4 mg via INTRAVENOUS

## 2018-06-14 MED ORDER — KETOROLAC TROMETHAMINE 30 MG/ML IJ SOLN
INTRAMUSCULAR | Status: AC
  Filled 2018-06-14: qty 1

## 2018-06-14 MED ORDER — FENTANYL CITRATE (PF) 100 MCG/2ML IJ SOLN
INTRAMUSCULAR | Status: AC
  Filled 2018-06-14: qty 2

## 2018-06-14 MED ORDER — SUGAMMADEX SODIUM 200 MG/2ML IV SOLN
INTRAVENOUS | Status: DC | PRN
  Administered 2018-06-14: 400 mg via INTRAVENOUS

## 2018-06-14 MED ORDER — LIDOCAINE 2% (20 MG/ML) 5 ML SYRINGE
INTRAMUSCULAR | Status: DC | PRN
  Administered 2018-06-14: 100 mg via INTRAVENOUS

## 2018-06-14 MED ORDER — SODIUM CHLORIDE 0.9 % IR SOLN
Status: DC | PRN
  Administered 2018-06-14: 1

## 2018-06-14 MED ORDER — FENTANYL CITRATE (PF) 100 MCG/2ML IJ SOLN
25.0000 ug | INTRAMUSCULAR | Status: DC | PRN
  Administered 2018-06-14 (×2): 25 ug via INTRAVENOUS

## 2018-06-14 MED ORDER — 0.9 % SODIUM CHLORIDE (POUR BTL) OPTIME
TOPICAL | Status: DC | PRN
  Administered 2018-06-14: 1000 mL

## 2018-06-14 MED ORDER — KETOROLAC TROMETHAMINE 30 MG/ML IJ SOLN
30.0000 mg | Freq: Once | INTRAMUSCULAR | Status: AC | PRN
  Administered 2018-06-14: 30 mg via INTRAVENOUS

## 2018-06-14 MED ORDER — OXYCODONE HCL 5 MG PO TABS
5.0000 mg | ORAL_TABLET | Freq: Once | ORAL | Status: AC | PRN
  Administered 2018-06-14: 5 mg via ORAL

## 2018-06-14 MED ORDER — IBUPROFEN 200 MG PO TABS: 200.0000 mg | ORAL_TABLET | Freq: Four times a day (QID) | ORAL | Status: DC | PRN

## 2018-06-14 MED ORDER — BUPIVACAINE HCL 0.25 % IJ SOLN
INTRAMUSCULAR | Status: DC | PRN
  Administered 2018-06-14: 6 mL

## 2018-06-14 MED ORDER — LIDOCAINE 2% (20 MG/ML) 5 ML SYRINGE
INTRAMUSCULAR | Status: AC
  Filled 2018-06-14: qty 5

## 2018-06-14 MED ORDER — CELECOXIB 200 MG PO CAPS: 200.0000 mg | ORAL_CAPSULE | ORAL | Status: DC

## 2018-06-14 SURGICAL SUPPLY — 40 items
ADH SKN CLS APL DERMABOND .7 (GAUZE/BANDAGES/DRESSINGS) ×1
BAG SPEC RTRVL 10 TROC 200 (ENDOMECHANICALS)
CANISTER SUCT 3000ML PPV (MISCELLANEOUS) ×3 IMPLANT
CHLORAPREP W/TINT 26ML (MISCELLANEOUS) ×3 IMPLANT
CLIP VESOLOCK MED LG 6/CT (CLIP) ×3 IMPLANT
COVER SURGICAL LIGHT HANDLE (MISCELLANEOUS) ×3 IMPLANT
COVER TRANSDUCER ULTRASND (DRAPES) ×3 IMPLANT
COVER WAND RF STERILE (DRAPES) ×3 IMPLANT
DEFOGGER SCOPE WARMER CLEARIFY (MISCELLANEOUS) IMPLANT
DERMABOND ADVANCED (GAUZE/BANDAGES/DRESSINGS) ×2
DERMABOND ADVANCED .7 DNX12 (GAUZE/BANDAGES/DRESSINGS) ×1 IMPLANT
ELECT REM PT RETURN 9FT ADLT (ELECTROSURGICAL) ×3
ELECTRODE REM PT RTRN 9FT ADLT (ELECTROSURGICAL) ×1 IMPLANT
GLOVE BIO SURGEON STRL SZ7.5 (GLOVE) ×3 IMPLANT
GOWN STRL REUS W/ TWL LRG LVL3 (GOWN DISPOSABLE) ×2 IMPLANT
GOWN STRL REUS W/ TWL XL LVL3 (GOWN DISPOSABLE) ×1 IMPLANT
GOWN STRL REUS W/TWL LRG LVL3 (GOWN DISPOSABLE) ×6
GOWN STRL REUS W/TWL XL LVL3 (GOWN DISPOSABLE) ×3
GRASPER SUT TROCAR 14GX15 (MISCELLANEOUS) ×3 IMPLANT
KIT BASIN OR (CUSTOM PROCEDURE TRAY) ×3 IMPLANT
KIT TURNOVER KIT B (KITS) ×3 IMPLANT
NDL INSUFFLATION 14GA 120MM (NEEDLE) ×1 IMPLANT
NEEDLE INSUFFLATION 14GA 120MM (NEEDLE) ×3 IMPLANT
NS IRRIG 1000ML POUR BTL (IV SOLUTION) ×3 IMPLANT
PAD ARMBOARD 7.5X6 YLW CONV (MISCELLANEOUS) ×3 IMPLANT
POUCH LAPAROSCOPIC INSTRUMENT (MISCELLANEOUS) ×3 IMPLANT
POUCH RETRIEVAL ECOSAC 10 (ENDOMECHANICALS) IMPLANT
POUCH RETRIEVAL ECOSAC 10MM (ENDOMECHANICALS)
SCISSORS LAP 5X35 DISP (ENDOMECHANICALS) ×3 IMPLANT
SET IRRIG TUBING LAPAROSCOPIC (IRRIGATION / IRRIGATOR) ×3 IMPLANT
SET TUBE SMOKE EVAC HIGH FLOW (TUBING) ×3 IMPLANT
SLEEVE ENDOPATH XCEL 5M (ENDOMECHANICALS) ×3 IMPLANT
SPECIMEN JAR SMALL (MISCELLANEOUS) ×3 IMPLANT
SUT MNCRL AB 4-0 PS2 18 (SUTURE) ×3 IMPLANT
TOWEL OR 17X24 6PK STRL BLUE (TOWEL DISPOSABLE) ×3 IMPLANT
TOWEL OR 17X26 10 PK STRL BLUE (TOWEL DISPOSABLE) ×3 IMPLANT
TRAY LAPAROSCOPIC MC (CUSTOM PROCEDURE TRAY) ×3 IMPLANT
TROCAR XCEL NON-BLD 11X100MML (ENDOMECHANICALS) ×3 IMPLANT
TROCAR XCEL NON-BLD 5MMX100MML (ENDOMECHANICALS) ×3 IMPLANT
WATER STERILE IRR 1000ML POUR (IV SOLUTION) ×3 IMPLANT

## 2018-06-14 NOTE — Op Note (Signed)
06/14/2018  8:21 AM  PATIENT:  Dennis Castro.  58 y.o. male  PRE-OPERATIVE DIAGNOSIS:  gallstones  POST-OPERATIVE DIAGNOSIS:  gallstones  PROCEDURE:  Procedure(s): LAPAROSCOPIC CHOLECYSTECTOMY (N/A)  SURGEON:  Surgeon(s) and Role:    Axel Filler, MD - Primary  ANESTHESIA:   local and general  EBL:  minimal  BLOOD ADMINISTERED:none  DRAINS: none   LOCAL MEDICATIONS USED:  BUPIVICAINE   SPECIMEN:  Source of Specimen:  gallbladder  DISPOSITION OF SPECIMEN:  PATHOLOGY  COUNTS:  YES  TOURNIQUET:  * No tourniquets in log *  DICTATION: .Dragon Dictation  EBL: <5cc   Complications: none   Counts: reported as correct x 2   Findings:chronic inflammation of the gallbladder and gallstones  Indications for procedure: Pt is a 49 M with RUQ pain and seen to have gallstones.   Details of the procedure:   The patient was taken to the operating and placed in the supine position with bilateral SCDs in place. A time out was called and all facts were verified. A pneumoperitoneum was obtained via A Veress needle technique to a pressure of 77mm of mercury. A 33mm trochar was then placed in the right upper quadrant under visualization, and there were no injuries to any abdominal organs. A 11 mm port was then placed in the umbilical region after infiltrating with local anesthesia under direct visualization. A second epigastric port was placed under direct visualization.   The gallbladder was identified and retracted, the peritoneum was then sharply dissected from the gallbladder and this dissection was carried down to Calot's triangle. The cystic duct was identified and dissected circumferentially and seen going into the gallbladder 360.  The cystic artery was dissected away from the surrounding tissues.   The critical angle was obtained.    2 clips were placed proximally one distally and the cystic duct transected. The cystic artery was identified and 2 clips placed proximally  and one distally and transected. We then proceeded to remove the gallbladder off the hepatic fossa with Bovie cautery. A retrieval bag was then placed in the abdomen and gallbladder placed in the bag. The hepatic fossa was then reexamined and hemostasis was achieved with Bovie cautery and was excellent at this portion of the case. The subhepatic fossa and perihepatic fossa was then irrigated until the effluent was clear. The specimen bag and specimen were removed from the abdominal cavity.  The 11 mm trocar fascia was reapproximated with the Endo Close #1 Vicryl x5. The pneumoperitoneum was evacuated and all trochars removed under direct visulalization. The skin was then closed with 4-0 Monocryl and the skin dressed with Dermabond. The patient was awaken from general anesthesia and taken to the recovery room in stable condition.  PLAN OF CARE: Discharge to home after PACU  PATIENT DISPOSITION:  PACU - hemodynamically stable.   Delay start of Pharmacological VTE agent (>24hrs) due to surgical blood loss or risk of bleeding: not applicable

## 2018-06-14 NOTE — Discharge Instructions (Signed)
[facility] ______[facility] °LAPAROSCOPIC SURGERY: POST OP INSTRUCTIONS °Always review your discharge instruction sheet given to you by the facility where your surgery was performed. °IF YOU HAVE DISABILITY OR FAMILY LEAVE FORMS, YOU MUST BRING THEM TO THE OFFICE FOR PROCESSING.   °DO NOT GIVE THEM TO YOUR DOCTOR. ° °1. A prescription for pain medication may be given to you upon discharge.  Take your pain medication as prescribed, if needed.  If narcotic pain medicine is not needed, then you may take acetaminophen (Tylenol) or ibuprofen (Advil) as needed. °2. Take your usually prescribed medications unless otherwise directed. °3. If you need a refill on your pain medication, please contact your pharmacy.  They will contact our office to request authorization. Prescriptions will not be filled after 5pm or on week-ends. °4. You should follow a light diet the first few days after arrival home, such as soup and crackers, etc.  Be sure to include lots of fluids daily. °5. Most patients will experience some swelling and bruising in the area of the incisions.  Ice packs will help.  Swelling and bruising can take several days to resolve.  °6. It is common to experience some constipation if taking pain medication after surgery.  Increasing fluid intake and taking a stool softener (such as Colace) will usually help or prevent this problem from occurring.  A mild laxative (Milk of Magnesia or Miralax) should be taken according to package instructions if there are no bowel movements after 48 hours. °7. Unless discharge instructions indicate otherwise, you may remove your bandages 24-48 hours after surgery, and you may shower at that time.  You may have steri-strips (small skin tapes) in place directly over the incision.  These strips should be left on the skin for 7-10 days.  If your surgeon used skin glue on the incision, you may shower in 24 hours.  The glue will flake off over the next 2-3 weeks.  Any sutures or  staples will be removed at the office during your follow-up visit. °8. ACTIVITIES:  You may resume regular (light) daily activities beginning the next day--such as daily self-care, walking, climbing stairs--gradually increasing activities as tolerated.  You may have sexual intercourse when it is comfortable.  Refrain from any heavy lifting or straining until approved by your doctor. °a. You may drive when you are no longer taking prescription pain medication, you can comfortably wear a seatbelt, and you can safely maneuver your car and apply brakes. °b. RETURN TO WORK:  __________________________________________________________ °9. You should see your doctor in the office for a follow-up appointment approximately 2-3 weeks after your surgery.  Make sure that you call for this appointment within a day or two after you arrive home to insure a convenient appointment time. °10. OTHER INSTRUCTIONS: __________________________________________________________________________________________________________________________ __________________________________________________________________________________________________________________________ °WHEN TO CALL YOUR DOCTOR: °1. Fever over 101.0 °2. Inability to urinate °3. Continued bleeding from incision. °4. Increased pain, redness, or drainage from the incision. °5. Increasing abdominal pain ° °The clinic staff is available to answer your questions during regular business hours.  Please don’t hesitate to call and ask to speak to one of the nurses for clinical concerns.  If you have a medical emergency, go to the nearest emergency room or call 911.  A surgeon from [facility] is always on call at the hospital. °[address], [address], [place], [place]  [address] ? [address] [address], [place], [place]   [address] °[phone] ? [phone] ? FAX [phone] °Web site:   [email or web address] °

## 2018-06-14 NOTE — H&P (Signed)
History of Present Illness The patient is a 58 year old male who presents for evaluation of gall stones. Referred by: Dr. Porfirio Oar Chief Complaint: Gallstones  Patient is a 58 year old male, with a history of hyperlipidemia, GERD, CAD, comes in with right upper quadrant pain. Patient states that in January of this year he had some right sided abdominal pain. He states this was overnight was fairly intense. Patient states he had no nausea, vomiting, diarrhea or constipation at time. He describes pain as burning and stabbing in nature. The patient states that secondary to continue pain proceeded to the ER. Upon evaluation ER ultrasound did reveal gallstones. Review this personally. Patient also laboratory studies which revealed normal LFTs. Secondary to the above findings abscess patient is referred for further evaluation and management.    Past Surgical History No pertinent past surgical history   Diagnostic Studies History  Colonoscopy  5-10 years ago  Allergies No Known Drug Allergies [05/06/2018]: Allergies Reconciled   Medication History Atorvastatin Calcium (20MG  Tablet, Oral) Active. Medications Reconciled  Social History  Caffeine use  Coffee, Tea. No alcohol use  No drug use  Tobacco use  Never smoker.  Family History  Colon Cancer  Mother. Diabetes Mellitus  Mother. Heart Disease  Mother. Malignant Neoplasm Of Pancreas  Father. Prostate Cancer  Father.  Other Problems  Cholelithiasis  Gastroesophageal Reflux Disease     Review of Systems  General Not Present- Appetite Loss, Chills, Fatigue, Fever, Night Sweats, Weight Gain and Weight Loss. Skin Not Present- Change in Wart/Mole, Dryness, Hives, Jaundice, New Lesions, Non-Healing Wounds, Rash and Ulcer. HEENT Present- Wears glasses/contact lenses. Not Present- Earache, Hearing Loss, Hoarseness, Nose Bleed, Oral Ulcers, Ringing in the Ears, Seasonal Allergies, Sinus Pain, Sore  Throat, Visual Disturbances and Yellow Eyes. Respiratory Not Present- Bloody sputum, Chronic Cough, Difficulty Breathing, Snoring and Wheezing. Breast Not Present- Breast Mass, Breast Pain, Nipple Discharge and Skin Changes. Cardiovascular Not Present- Chest Pain, Difficulty Breathing Lying Down, Leg Cramps, Palpitations, Rapid Heart Rate, Shortness of Breath and Swelling of Extremities. Gastrointestinal Not Present- Abdominal Pain, Bloating, Bloody Stool, Change in Bowel Habits, Chronic diarrhea, Constipation, Difficulty Swallowing, Excessive gas, Gets full quickly at meals, Hemorrhoids, Indigestion, Nausea, Rectal Pain and Vomiting. Male Genitourinary Not Present- Blood in Urine, Change in Urinary Stream, Frequency, Impotence, Nocturia, Painful Urination, Urgency and Urine Leakage. Musculoskeletal Not Present- Back Pain, Joint Pain, Joint Stiffness, Muscle Pain, Muscle Weakness and Swelling of Extremities. Neurological Not Present- Decreased Memory, Fainting, Headaches, Numbness, Seizures, Tingling, Tremor, Trouble walking and Weakness. Psychiatric Not Present- Anxiety, Bipolar, Change in Sleep Pattern, Depression, Fearful and Frequent crying. Endocrine Not Present- Cold Intolerance, Excessive Hunger, Hair Changes, Heat Intolerance, Hot flashes and New Diabetes. Hematology Not Present- Blood Thinners, Easy Bruising, Excessive bleeding, Gland problems, HIV and Persistent Infections. All other systems negative  BP 132/76   Pulse 62   Temp 97.9 F (36.6 C) (Oral)   Resp 18   SpO2 98%      Physical Exam  The physical exam findings are as follows: Note:Constitutional: No acute distress, conversant, appears stated age  Eyes: Anicteric sclerae, moist conjunctiva, no lid lag  Neck: No thyromegaly, trachea midline, no cervical lymphadenopathy  Lungs: Clear to auscultation biilaterally, normal respiratory effot  Cardiovascular: regular rate & rhythm, no murmurs, no peripheal edema,  pedal pulses 2+  GI: Soft, no masses or hepatosplenomegaly, non-tender to palpation  MSK: Normal gait, no clubbing cyanosis, edema  Skin: No rashes, palpation reveals normal skin turgor  Psychiatric:  Appropriate judgment and insight, oriented to person, place, and time    Assessment & Plan  SYMPTOMATIC CHOLELITHIASIS (K80.20) Impression: 58 year old male with symptomatic cholelithiasis  1. We will proceed to the operating room for a laparoscopic cholecystectomy  2. Risks and benefits were discussed with the patient to generally include, but not limited to: infection, bleeding, possible need for post op ERCP, damage to the bile ducts, bile leak, and possible need for further surgery. Alternatives were offered and described. All questions were answered and the patient voiced understanding of the procedure and wishes to proceed at this point with a laparoscopic cholecystectomy

## 2018-06-14 NOTE — Anesthesia Procedure Notes (Signed)
Procedure Name: Intubation Date/Time: 06/14/2018 7:33 AM Performed by: Kyung Rudd, CRNA Pre-anesthesia Checklist: Patient identified, Emergency Drugs available, Suction available and Patient being monitored Patient Re-evaluated:Patient Re-evaluated prior to induction Oxygen Delivery Method: Circle system utilized Preoxygenation: Pre-oxygenation with 100% oxygen Induction Type: IV induction Ventilation: Mask ventilation without difficulty Laryngoscope Size: Mac and 4 Grade View: Grade II Tube type: Oral Tube size: 7.5 mm Number of attempts: 1 Airway Equipment and Method: Stylet Placement Confirmation: ETT inserted through vocal cords under direct vision,  positive ETCO2 and breath sounds checked- equal and bilateral Secured at: 21 cm Tube secured with: Tape Dental Injury: Teeth and Oropharynx as per pre-operative assessment

## 2018-06-14 NOTE — Anesthesia Postprocedure Evaluation (Signed)
Anesthesia Post Note  Patient: Dennis Castro.  Procedure(s) Performed: LAPAROSCOPIC CHOLECYSTECTOMY (N/A Abdomen)     Patient location during evaluation: PACU Anesthesia Type: General Level of consciousness: awake Pain management: pain level controlled Vital Signs Assessment: post-procedure vital signs reviewed and stable Respiratory status: spontaneous breathing Cardiovascular status: stable Postop Assessment: no apparent nausea or vomiting Anesthetic complications: no    Last Vitals:  Vitals:   06/14/18 0924 06/14/18 0945  BP: 127/84 (!) 144/95  Pulse: (!) 57 (!) 59  Resp: 11   Temp: 36.5 C   SpO2: 96% 100%    Last Pain:  Vitals:   06/14/18 0945  TempSrc:   PainSc: 4    Pain Goal: Patients Stated Pain Goal: 4 (06/14/18 0945)                 Caren Macadam

## 2018-06-14 NOTE — Transfer of Care (Signed)
Immediate Anesthesia Transfer of Care Note  Patient: Dennis Castro.  Procedure(s) Performed: LAPAROSCOPIC CHOLECYSTECTOMY (N/A Abdomen)  Patient Location: PACU  Anesthesia Type:General  Level of Consciousness: awake, alert  and oriented  Airway & Oxygen Therapy: Patient Spontanous Breathing and Patient connected to nasal cannula oxygen  Post-op Assessment: Report given to RN, Post -op Vital signs reviewed and stable and Patient moving all extremities X 4  Post vital signs: Reviewed and stable  Last Vitals:  Vitals Value Taken Time  BP 114/68 06/14/2018  8:39 AM  Temp    Pulse 72 06/14/2018  8:40 AM  Resp 15 06/14/2018  8:40 AM  SpO2 97 % 06/14/2018  8:40 AM  Vitals shown include unvalidated device data.  Last Pain:  Vitals:   06/14/18 0601  TempSrc: Oral  PainSc: 0-No pain      Patients Stated Pain Goal: 5 (06/14/18 0601)  Complications: No apparent anesthesia complications

## 2018-06-15 ENCOUNTER — Encounter (HOSPITAL_COMMUNITY): Payer: Self-pay | Admitting: General Surgery

## 2018-07-24 ENCOUNTER — Ambulatory Visit (INDEPENDENT_AMBULATORY_CARE_PROVIDER_SITE_OTHER): Payer: 59 | Admitting: Internal Medicine

## 2018-07-24 ENCOUNTER — Other Ambulatory Visit: Payer: Self-pay

## 2018-07-24 DIAGNOSIS — Z800 Family history of malignant neoplasm of digestive organs: Secondary | ICD-10-CM

## 2018-07-24 DIAGNOSIS — R21 Rash and other nonspecific skin eruption: Secondary | ICD-10-CM

## 2018-07-24 DIAGNOSIS — M722 Plantar fascial fibromatosis: Secondary | ICD-10-CM | POA: Diagnosis not present

## 2018-07-24 MED ORDER — HYDROCORTISONE 2.5 % EX CREA: TOPICAL_CREAM | Freq: Two times a day (BID) | CUTANEOUS | 0 refills | Status: DC

## 2018-07-24 NOTE — Progress Notes (Signed)
Subjective:    Patient ID: Dennis Castro., male    DOB: 1960-09-01, 58 y.o.   MRN: [id number]  DOS:  07/24/2018 Type of visit - description: Virtual Visit via Video Note  I connected with@ on 07/25/18 at 11:00 AM EDT by a video enabled telemedicine application and verified that I am speaking with the correct person using two identifiers.   THIS ENCOUNTER IS A VIRTUAL VISIT DUE TO COVID-19 - PATIENT WAS NOT SEEN IN THE OFFICE. PATIENT HAS CONSENTED TO VIRTUAL VISIT / TELEMEDICINE VISIT   Location of patient: home  Location of provider: office  I discussed the limitations of evaluation and management by telemedicine and the availability of in person appointments. The patient expressed understanding and agreed to proceed.  History of Present Illness: Follow-up Since the last office visit, had a cholecystectomy, had a follow-up with surgery and he was felt to be doing well. One  of the areas of the surgery has a still a scab but no redness or discharge. Also, developed a rash on the right chest 4 weeks ago, patient is not sure if it was blistery at the beginning but the area remains painful, "like the skin is raw". Continue with bilateral feet pain, he thinks is plantar fasciitis   Review of Systems No fever chills No nausea or vomiting.  Mild loose stools fecal urgency since gallbladder surgery.  Overall symptoms improving No cough.   Past Medical History:  Diagnosis Date  . Arthritis   . Atypical chest pain   . Cholelithiasis 09/2015   determined by CT, asymptomatic  . Diverticulitis   . Family history of heart disease   . GERD (gastroesophageal reflux disease)   . Hyperlipidemia   . Seasonal allergies     Past Surgical History:  Procedure Laterality Date  . CARDIAC CATHETERIZATION  2010   Dr. Allyson Sabal; less than 30% occlusive disease  . CHOLECYSTECTOMY N/A 06/14/2018   Procedure: LAPAROSCOPIC CHOLECYSTECTOMY;  Surgeon: Axel Filler, MD;  Location: [facility] OR;  Service:  General;  Laterality: N/A;  . TONSILLECTOMY     age 60    Social History   Socioeconomic History  . Marital status: Married    Spouse name: Not on file  . Number of children: 1  . Years of education: Not on file  . Highest education level: Not on file  Occupational History  . Occupation: Production designer, theatre/television/film, office job  Social Needs  . Financial resource strain: Not on file  . Food insecurity:    Worry: Not on file    Inability: Not on file  . Transportation needs:    Medical: Not on file    Non-medical: Not on file  Tobacco Use  . Smoking status: Never Smoker  . Smokeless tobacco: Never Used  Substance and Sexual Activity  . Alcohol use: No  . Drug use: No  . Sexual activity: Yes  Lifestyle  . Physical activity:    Days per week: Not on file    Minutes per session: Not on file  . Stress: Not on file  Relationships  . Social connections:    Talks on phone: Not on file    Gets together: Not on file    Attends religious service: Not on file    Active member of club or organization: Not on file    Attends meetings of clubs or organizations: Not on file    Relationship status: Not on file  . Intimate partner violence:    Fear of  current or ex partner: Not on file    Emotionally abused: Not on file    Physically abused: Not on file    Forced sexual activity: Not on file  Other Topics Concern  . 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 Always use your most recent med list.        aspirin EC 81 MG tablet Take 81 mg by mouth daily.   atorvastatin 20 MG tablet Commonly known as:  LIPITOR Take 1 tablet (20 mg total) by mouth at bedtime.   hydrocortisone 2.5 % cream Apply topically 2 (two) times daily. For the next 10 days   sildenafil 20 MG tablet Commonly known as:  REVATIO Take 2-3 tablets (40-60 mg total) by mouth daily as needed.            Objective:   Physical Exam Skin:        There were no vitals taken for this visit. This is video visit, I was able to assess his skin.  See graphic.  He is alert and oriented x3    Assessment     Assessment   Hyperlipidemia before  GERD Obesity  CAD:  --Chest pain: Myoview stress test subtle anterior ischemia --Cardiac cath 06-24-09: Noncritical 30% LAD with subtle anteroapical HK. Diverticulitis Seasonal allergies +FH prostate ca (father age 60), CAD (mother age 62) GB-kidney  Stones  PLAN: Cholecystectomy: Status post surgery 06/14/2018, seems to be doing well, is not requiring any pain medication. Family history of pancreatic cancer, re-referred to the genetic clinic. Rash: Given the location, and the fact that the rash still hurts  suspect this was probably shingles.  For now recommend hydrocortisone, Rx sent.  Will call if the rash gets worse with topical steroids. Bilateral feet pain: Worse, patient suspects plantar fasciitis, refer to sports medicine. Follow-up CPX 01-2019, my staff will call and set up   I discussed the assessment and treatment plan with the patient. The patient was provided an opportunity to ask questions and all were answered. The patient agreed with the plan and demonstrated an understanding of the instructions.   The patient was advised to call back or seek an in-person evaluation if the symptoms worsen or if the condition fails to improve as anticipated.

## 2018-07-25 NOTE — Assessment & Plan Note (Signed)
Cholecystectomy: Status post surgery 06/14/2018, seems to be doing well, is not requiring any pain medication. Family history of pancreatic cancer, re-referred to the genetic clinic. Rash: Given the location, and the fact that the rash still hurts  suspect this was probably shingles.  For now recommend hydrocortisone, Rx sent.  Will call if the rash gets worse with topical steroids. Bilateral feet pain: Worse, patient suspects plantar fasciitis, refer to sports medicine. Follow-up CPX 01-2019, my staff will call and set up

## 2018-07-29 ENCOUNTER — Other Ambulatory Visit: Payer: Self-pay

## 2018-07-29 ENCOUNTER — Encounter: Payer: Self-pay | Admitting: Family Medicine

## 2018-07-29 ENCOUNTER — Ambulatory Visit: Payer: 59 | Admitting: Family Medicine

## 2018-07-29 VITALS — BP 106/70 | HR 59 | Ht 77.0 in | Wt 290.0 lb

## 2018-07-29 DIAGNOSIS — M722 Plantar fascial fibromatosis: Secondary | ICD-10-CM | POA: Diagnosis not present

## 2018-07-29 DIAGNOSIS — M2580 Other specified joint disorders, unspecified joint: Secondary | ICD-10-CM | POA: Diagnosis not present

## 2018-07-29 NOTE — Patient Instructions (Signed)
You have plantar fasciitis and sesamoiditis of both feet. Take tylenol and/or aleve as needed for pain  Plantar fascia stretch for 20-30 seconds (do 3 of these) in morning Lowering/raise on a step exercises 3 x 10 once or twice a day - this is very important for long term recovery. Ease on these exercises though if they worsen your great toe pain. Ice heel for 15 minutes as needed. Avoid flat shoes/barefoot walking as much as possible. Inserts are important - we can add a sesamoid pad if necessary if this isn't helping enough. Physical therapy is also an option. Follow up with me in 6 weeks otherwise if you're doing well.

## 2018-07-29 NOTE — Progress Notes (Signed)
PCP: Wanda Plump, MD  Subjective:   HPI: Patient is a 58 y.o. male here for bilateral foot pain.  Patient reports bilateral plantar foot pain for the past 6 months.  He denies any injury.  He reports generally 3/10 pain which can be as bad as 8/10 at times.  It is typically worse first thing in the morning and improves as the day goes on and with activity.  He localizes his pain anywhere from the metatarsal heads along the plantar arch to the heel.  Pain is worse with weightbearing/walking.  He recently got new shoes try to help with the pain.  He has also worn various insoles.  He reports minimal benefit with any interventions.  He denies any swelling or erythema.  No bruising.  No numbness or tingling.  No skin changes.  Past Medical History:  Diagnosis Date  . Arthritis   . Atypical chest pain   . Cholelithiasis 09/2015   determined by CT, asymptomatic  . Diverticulitis   . Family history of heart disease   . GERD (gastroesophageal reflux disease)   . Hyperlipidemia   . Seasonal allergies     Current Outpatient Medications on File Prior to Visit  Medication Sig Dispense Refill  . aspirin EC 81 MG tablet Take 81 mg by mouth daily.    [name] atorvastatin (LIPITOR) 20 MG tablet Take 1 tablet (20 mg total) by mouth at bedtime. 90 tablet 3  . hydrocortisone 2.5 % cream Apply topically 2 (two) times daily. For the next 10 days 30 g 0  . sildenafil (REVATIO) 20 MG tablet Take 2-3 tablets (40-60 mg total) by mouth daily as needed. 30 tablet 5   No current facility-administered medications on file prior to visit.     Past Surgical History:  Procedure Laterality Date  . CARDIAC CATHETERIZATION  2010   Dr. Allyson Sabal; less than 30% occlusive disease  . CHOLECYSTECTOMY N/A 06/14/2018   Procedure: LAPAROSCOPIC CHOLECYSTECTOMY;  Surgeon: Axel Filler, MD;  Location: [facility] OR;  Service: General;  Laterality: N/A;  . TONSILLECTOMY     age 17    No Known Allergies  Social History   Socioeconomic  History  . Marital status: Married    Spouse name: Not on file  . Number of children: 1  . Years of education: Not on file  . Highest education level: Not on file  Occupational History  . Occupation: Production designer, theatre/television/film, office job  Social Needs  . Financial resource strain: Not on file  . Food insecurity:    Worry: Not on file    Inability: Not on file  . Transportation needs:    Medical: Not on file    Non-medical: Not on file  Tobacco Use  . Smoking status: Never Smoker  . Smokeless tobacco: Never Used  Substance and Sexual Activity  . Alcohol use: No  . Drug use: No  . Sexual activity: Yes  Lifestyle  . Physical activity:    Days per week: Not on file    Minutes per session: Not on file  . Stress: Not on file  Relationships  . Social connections:    Talks on phone: Not on file    Gets together: Not on file    Attends religious service: Not on file    Active member of club or organization: Not on file    Attends meetings of clubs or organizations: Not on file    Relationship status: Not on file  . Intimate partner violence:  Fear of current or ex partner: Not on file    Emotionally abused: Not on file    Physically abused: Not on file    Forced sexual activity: Not on file  Other Topics Concern  . 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 Diabetes Mother   . CAD Mother [id number]       CBAG  . Cancer Father        prostate cancer and panceatic cancer  . Breast cancer Maternal Grandmother   . Pancreatic cancer Paternal Grandfather   . Stroke Neg Hx   . Colon cancer Neg Hx     BP 106/70   Pulse (!) 59   Ht 6\' 5"  (1.956 m)   Wt 290 lb (131.5 kg)   BMI 34.39 kg/m   Review of Systems: See HPI above.     Objective:  Physical Exam:  Gen: awake, alert, NAD, comfortable in exam room Pulm: breathing unlabored  Right foot: Inspection: Moderate pes planus.  Transverse arch collapse with splaying most  prominently between second and third toes.  No focal swelling or erythema Palpation: Tenderness over the plantar aspect of the first MTP.  There is also tenderness over the distal plantar fascia ROM: Full ROM of the ankle.  Strength: 5/5 strength ankle in all planes Neurovascular: N/V intact distally in the lower extremity Special tests:  Negative squeeze   Left foot: Inspection: Moderate pes planus.  Transverse arch collapse with splaying most prominently between second and third toes.  No focal swelling or erythema Palpation: Tenderness over the plantar aspect of the first MTP.  There is also tenderness over the distal plantar fascia ROM: Full ROM of the ankle.  Strength: 5/5 strength ankle in all planes Neurovascular: N/V intact distally in the lower extremity Special tests:  Negative squeeze   MSK US: Limited US of the feet bilaterally. Right: mild thickening of the plantar fascia at the insertion on the achilles, distally there is a nodular thickening noted at his area of tenderness. The sesamoids were visualized and appear irregular but with no swelling or neovascularization.  Left: plantar fascia appears normal at the insertion on the achilles, distally there are two areas of nodular thickening noted at his area of tenderness. The sesamoids were visualized and appear irregular but with no swelling or neovascularization.    Assessment & Plan:  1. 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

## 2018-07-30 ENCOUNTER — Encounter: Payer: Self-pay | Admitting: Family Medicine

## 2018-08-13 ENCOUNTER — Telehealth: Payer: Self-pay

## 2018-08-13 ENCOUNTER — Telehealth (INDEPENDENT_AMBULATORY_CARE_PROVIDER_SITE_OTHER): Payer: 59 | Admitting: Cardiovascular Disease

## 2018-08-13 ENCOUNTER — Encounter: Payer: Self-pay | Admitting: Cardiovascular Disease

## 2018-08-13 DIAGNOSIS — I2510 Atherosclerotic heart disease of native coronary artery without angina pectoris: Secondary | ICD-10-CM

## 2018-08-13 DIAGNOSIS — E782 Mixed hyperlipidemia: Secondary | ICD-10-CM | POA: Diagnosis not present

## 2018-08-13 NOTE — Telephone Encounter (Signed)
LMTCB REGARDING AVS INSTRUCTIONS  Letter including After Visit Summary and any other necessary documents to be mailed to the patient's address on file.

## 2018-08-13 NOTE — Progress Notes (Signed)
Virtual Visit via Video Note   This visit type was conducted due to national recommendations for restrictions regarding the COVID-19 Pandemic (e.g. social distancing) in an effort to limit this patient's exposure and mitigate transmission in our community.  Due to his co-morbid illnesses, this patient is at least at moderate risk for complications without adequate follow up.  This format is felt to be most appropriate for this patient at this time.  All issues noted in this document were discussed and addressed.  A limited physical exam was performed with this format.  Please refer to the patient's chart for his consent to telehealth for [facility].   Date:  08/13/2018   ID:  Dennis Sellers., DOB January 12, 1961, MRN [id number]  Patient Location: Home Provider Location: Home  PCP:  Wanda Plump, MD  Cardiologist: Dr. Nanetta Batty Electrophysiologist:  None   Evaluation Performed:  Follow-Up Visit  Chief Complaint: 1 year follow-up hyperlipidemia  History of Present Illness:    Dennis Castro is a 58 y.o.  severely overweight married Caucasian male father of one Who works at [employer] in [place]. I last saw him in the office  08/10/2017. He was having atypical chest pain and had a Myoview stress test that showed subtle anterior ischemia. Based on this he underwent cardiac catheterization by myself 06/24/09 revealing noncritical CAD with at most a 30% hypodense segmental mid LAD stent stenosis with very subtle anteroapical hypokinesia. Medical therapy is recommended. He continued to have atypical chest pain. He does have a family history of heart disease with a mother who had stents in her 69s and bypass surgery at 40.  He was seen in the ER 07/05/2017 with atypical chest pain.  His EKG showed no acute changes and he ruled out by enzymes.  He had no recurrent symptoms.  Since I saw him 12 months ago he has gone on a diet and exercise campaign and is lost 45 pounds down  from 3 35-2 90.  He feels clinically improved.  Is otherwise asymptomatic denying chest pain or shortness of breath.  He does walk 30 to 60 minutes a day. The patient does not have symptoms concerning for COVID-19 infection (fever, chills, cough, or new shortness of breath).    Past Medical History:  Diagnosis Date   Arthritis    Atypical chest pain    Cholelithiasis 09/2015   determined by CT, asymptomatic   Diverticulitis    Family history of heart disease    GERD (gastroesophageal reflux disease)    Hyperlipidemia    Seasonal allergies    Past Surgical History:  Procedure Laterality Date   CARDIAC CATHETERIZATION  2010   Dr. Allyson Sabal; less than 30% occlusive disease   CHOLECYSTECTOMY N/A 06/14/2018   Procedure: LAPAROSCOPIC CHOLECYSTECTOMY;  Surgeon: Axel Filler, MD;  Location: [facility] OR;  Service: General;  Laterality: N/A;   TONSILLECTOMY     age 58     Current Meds  Medication Sig   aspirin EC 81 MG tablet Take 81 mg by mouth daily.   atorvastatin (LIPITOR) 20 MG tablet Take 1 tablet (20 mg total) by mouth at bedtime.   hydrocortisone 2.5 % cream Apply topically 2 (two) times daily. For the next 10 days     Allergies:   Patient has no known allergies.   Social History   Tobacco Use   Smoking status: Never Smoker   Smokeless tobacco: Never Used  Substance Use Topics   Alcohol use: No  Drug use: No     Family Hx: The patient's family history includes Breast cancer in his maternal grandmother; CAD (age of onset: 60) in his mother; Cancer in his father; Diabetes in his mother; Pancreatic cancer in his paternal grandfather. There is no history of Stroke or Colon cancer.  ROS:   Please see the history of present illness.     All other systems reviewed and are negative.   Prior CV studies:   The following studies were reviewed today:  None  Labs/Other Tests and Data Reviewed:    EKG:  No ECG reviewed.  Recent Labs: 01/16/2018: TSH  1.60 04/06/2018: ALT 26; BUN 16; Creatinine, Ser 0.92; Potassium 3.9; Sodium 136 06/07/2018: Hemoglobin 14.6; Platelets 285   Recent Lipid Panel Lab Results  Component Value Date/Time   CHOL 105 09/10/2017 08:41 AM   TRIG 103.0 09/10/2017 08:41 AM   HDL 34.60 (L) 09/10/2017 08:41 AM   CHOLHDL 3 09/10/2017 08:41 AM   LDLCALC 50 09/10/2017 08:41 AM    Wt Readings from Last 3 Encounters:  08/13/18 290 lb (131.5 kg)  07/29/18 290 lb (131.5 kg)  06/07/18 (!) 310 lb 12.8 oz (141 kg)     Objective:    Vital Signs:  Ht 6\' 5"  (1.956 m)    Wt 290 lb (131.5 kg)    BMI 34.39 kg/m    VITAL SIGNS:  reviewed GEN:  no acute distress RESPIRATORY:  normal respiratory effort, symmetric expansion NEURO:  alert and oriented x 3, no obvious focal deficit PSYCH:  normal affect  ASSESSMENT & PLAN:    1. Hyperlipidemia- history of hyperlipidemia on atorvastatin 20 mg a day with lipid profile performed 09/10/2017 revealing total cholesterol 105, LDL 50 and HDL of 34 2. Coronary artery disease- history of chronic catheterization 06/24/2009 revealing noncritical CAD.  He is completely asymptomatic.  COVID-19 Education: The signs and symptoms of COVID-19 were discussed with the patient and how to seek care for testing (follow up with PCP or arrange E-visit).  The importance of social distancing was discussed today.  Time:   Today, I have spent 6 minutes with the patient with telehealth technology discussing the above problems.     Medication Adjustments/Labs and Tests Ordered: Current medicines are reviewed at length with the patient today.  Concerns regarding medicines are outlined above.   Tests Ordered: No orders of the defined types were placed in this encounter.   Medication Changes: No orders of the defined types were placed in this encounter.   Disposition:  Follow up in 1 year(s)  Signed, Nanetta Batty, MD  08/13/2018 3:55 PM    [facility]

## 2018-08-13 NOTE — Patient Instructions (Signed)

## 2018-09-09 ENCOUNTER — Ambulatory Visit: Payer: 59 | Admitting: Family Medicine

## 2018-09-09 ENCOUNTER — Encounter: Payer: Self-pay | Admitting: Family Medicine

## 2018-09-09 ENCOUNTER — Other Ambulatory Visit: Payer: Self-pay

## 2018-09-09 VITALS — BP 109/74 | HR 75 | Ht 77.0 in | Wt 290.0 lb

## 2018-09-09 DIAGNOSIS — M722 Plantar fascial fibromatosis: Secondary | ICD-10-CM | POA: Diagnosis not present

## 2018-09-09 DIAGNOSIS — M2580 Other specified joint disorders, unspecified joint: Secondary | ICD-10-CM

## 2018-09-09 NOTE — Progress Notes (Signed)
PCP: Colon Branch, MD  Subjective:   HPI: Patient is a 58 y.o. male here for bilateral foot pain.  4/27: Patient reports bilateral plantar foot pain for the past 6 months.  He denies any injury.  He reports generally 3/10 pain which can be as bad as 8/10 at times.  It is typically worse first thing in the morning and improves as the day goes on and with activity.  He localizes his pain anywhere from the metatarsal heads along the plantar arch to the heel.  Pain is worse with weightbearing/walking.  He recently got new shoes try to help with the pain.  He has also worn various insoles.  He reports minimal benefit with any interventions.  He denies any swelling or erythema.  No bruising.  No numbness or tingling.  No skin changes.  6/8: Pt returns for bilateral foot pain due to plantar fibromatosis/fasciitis and sesamoiditis. He reports continued heel pain despite sports insoles and home stretches. Right is worst than left and is worse first thing in the morning for after he has been sitting for a while, sharp.  Currently here in the office, he reports 0/10 pain.  He does note that the pain on the plantar aspect of his first MTP bilaterally has improved because he is not been walking like he was previously.  Not taking pain medications.  Denies any erythema or swelling.  No skin changes.  No numbness or tingling.  Past Medical History:  Diagnosis Date  . Arthritis   . Atypical chest pain   . Cholelithiasis 09/2015   determined by CT, asymptomatic  . Diverticulitis   . Family history of heart disease   . GERD (gastroesophageal reflux disease)   . Hyperlipidemia   . Seasonal allergies     Current Outpatient Medications on File Prior to Visit  Medication Sig Dispense Refill  . aspirin EC 81 MG tablet Take 81 mg by mouth daily.    [name] atorvastatin (LIPITOR) 20 MG tablet Take 1 tablet (20 mg total) by mouth at bedtime. 90 tablet 3  . hydrocortisone 2.5 % cream Apply topically 2 (two) times daily.  For the next 10 days 30 g 0   No current facility-administered medications on file prior to visit.     Past Surgical History:  Procedure Laterality Date  . CARDIAC CATHETERIZATION  2010   Dr. Gwenlyn Found; less than 30% occlusive disease  . CHOLECYSTECTOMY N/A 06/14/2018   Procedure: LAPAROSCOPIC CHOLECYSTECTOMY;  Surgeon: Ralene Ok, MD;  Location: [facility];  Service: General;  Laterality: N/A;  . TONSILLECTOMY     age 71    No Known Allergies  Social History   Socioeconomic History  . Marital status: Married    Spouse name: Not on file  . Number of children: 1  . Years of education: Not on file  . Highest education level: Not on file  Occupational History  . Occupation: Freight forwarder, office job  Social Needs  . Financial resource strain: Not on file  . Food insecurity:    Worry: Not on file    Inability: Not on file  . Transportation needs:    Medical: Not on file    Non-medical: Not on file  Tobacco Use  . Smoking status: Never Smoker  . Smokeless tobacco: Never Used  Substance and Sexual Activity  . Alcohol use: No  . Drug use: No  . Sexual activity: Yes  Lifestyle  . Physical activity:    Days per week: Not on  file    Minutes per session: Not on file  . Stress: Not on file  Relationships  . Social connections:    Talks on phone: Not on file    Gets together: Not on file    Attends religious service: Not on file    Active member of club or organization: Not on file    Attends meetings of clubs or organizations: Not on file    Relationship status: Not on file  . Intimate partner violence:    Fear of current or ex partner: Not on file    Emotionally abused: Not on file    Physically abused: Not on file    Forced sexual activity: Not on file  Other Topics Concern  . 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 Diabetes Mother   . CAD Mother [id number]       CBAG  . Cancer Father        prostate cancer and  panceatic cancer  . Breast cancer Maternal Grandmother   . Pancreatic cancer Paternal Grandfather   . Stroke Neg Hx   . Colon cancer Neg Hx     BP 109/74   Pulse 75   Ht 6\' 5"  (1.956 m)   Wt 290 lb (131.5 kg)   BMI 34.39 kg/m   Review of Systems: See HPI above.     Objective:  Physical Exam:  GEN: Awake, alert, no acute distress Pulmonary: Breathing unlabored  Right foot: Inspection: moderate pes planus, transverse arch collapse with splaying at 2nd and 3rd toes.  Palpation: tenderness over the plantar calcaneus and along plantar fascia. TTP over sesamoids ROM: Full ROM of the ankle.  Strength: 5/5 strength ankle in all planes Neurovascular: N/V intact   Left foot: Inspection: Moderate pes planus and transverse arch cllapse Palpation: TTP over plantar fascia, especially at the arch. TTP over the sesamoids moreso than the right foot Strength: 5/5 ankle strength Neurovascular: NV intact   Assessment & Plan:  1. Bilateral foot pain 2/2 plantar fasciitis/fibromatosis and sesamoiditis. At this time the plantar fascia pain is the primary concern and has not improved with initial tx - continue insoles - arch straps - recommend strassburg socks - PT referral - f/u 6 weeks

## 2018-09-09 NOTE — Patient Instructions (Signed)
You have plantar fasciitis, less sesamoiditis of both feet. Take tylenol and/or aleve as needed for pain  Plantar fascia stretch for 20-30 seconds (do 3 of these) in morning Lowering/raise on a step exercises 3 x 10 once or twice a day - this is very important for long term recovery. Start physical therapy and do the home exercises on days you don't go to therapy. Arch binders when up and walking around if these help with pain. Strassburg sock or night splints when sleeping should help with the morning pain. Ice heel for 15 minutes as needed. Avoid flat shoes/barefoot walking as much as possible. Continue wearing the inserts. Follow up with me in 6 weeks.

## 2018-09-10 ENCOUNTER — Encounter: Payer: Self-pay | Admitting: Family Medicine

## 2018-09-10 NOTE — Addendum Note (Signed)
Addended by: Sherrie George F on: 09/10/2018 03:31 PM   Modules accepted: Orders

## 2018-09-18 ENCOUNTER — Other Ambulatory Visit: Payer: Self-pay

## 2018-09-18 ENCOUNTER — Ambulatory Visit: Payer: 59 | Attending: Family Medicine | Admitting: Physical Therapy

## 2018-09-18 ENCOUNTER — Encounter: Payer: Self-pay | Admitting: Physical Therapy

## 2018-09-18 DIAGNOSIS — R29898 Other symptoms and signs involving the musculoskeletal system: Secondary | ICD-10-CM | POA: Insufficient documentation

## 2018-09-18 DIAGNOSIS — M79672 Pain in left foot: Secondary | ICD-10-CM | POA: Insufficient documentation

## 2018-09-18 DIAGNOSIS — M25675 Stiffness of left foot, not elsewhere classified: Secondary | ICD-10-CM | POA: Diagnosis present

## 2018-09-18 DIAGNOSIS — R262 Difficulty in walking, not elsewhere classified: Secondary | ICD-10-CM

## 2018-09-18 DIAGNOSIS — M79671 Pain in right foot: Secondary | ICD-10-CM

## 2018-09-18 DIAGNOSIS — M25674 Stiffness of right foot, not elsewhere classified: Secondary | ICD-10-CM | POA: Insufficient documentation

## 2018-09-18 NOTE — Therapy (Signed)
[facility] [place] [address]  [address] [place], [place], [address] Phone: [phone]   Fax:  [phone]  Physical Therapy Evaluation  Patient Details  Name: Dennis SellersJoseph E Monahan Jr. MRN: [id number] Date of Birth: 05/26/1960 Referring Provider (PT): Norton BlizzardShane Hudnall, MD   Encounter Date: 09/18/2018  PT End of Session - 09/18/18 1338    Visit Number  1    Number of Visits  17    Date for PT Re-Evaluation  11/13/18    Authorization Type  UHC    PT Start Time  0958    PT Stop Time  1054    PT Time Calculation (min)  56 min    Activity Tolerance  Patient tolerated treatment well    Behavior During Therapy  [facility] for tasks assessed/performed       Past Medical History:  Diagnosis Date  . Arthritis   . Atypical chest pain   . Cholelithiasis 09/2015   determined by CT, asymptomatic  . Diverticulitis   . Family history of heart disease   . GERD (gastroesophageal reflux disease)   . Hyperlipidemia   . Seasonal allergies     Past Surgical History:  Procedure Laterality Date  . CARDIAC CATHETERIZATION  2010   Dr. Allyson SabalBerry; less than 30% occlusive disease  . CHOLECYSTECTOMY N/A 06/14/2018   Procedure: LAPAROSCOPIC CHOLECYSTECTOMY;  Surgeon: Axel Filleramirez, Armando, MD;  [facility];  Service: General;  Laterality: N/A;  . TONSILLECTOMY     age 58    There were no vitals filed for this visit.   Subjective Assessment - 09/18/18 1013    Subjective  Patient reports B foot pain for the past year, with symptoms progressively worsening recently. Pain is located over B lateral and central heel as well as plantar surface of 1st and 2nd digits. Does endorse intermittent N/T in B toes. Foot pain is worse with prolonged walking, standing, stairs, yard work. Also worse in AM, and after long bouts of activity in PM. Has tried inserts but these have not helped much. Notes that he likes to walk barefoot often. Notices that he has to go down stairs sideways in  the AM d/t feeling of stretching in B HS. Mentions that he lacks flexibility.    Pertinent History  HLD, GERD, atypical chest pain, arthritis, cardiac cath 2010    Limitations  Lifting;Standing;Walking;House hold activities    How long can you stand comfortably?  1 hour    How long can you walk comfortably?  30 min    Diagnostic tests  none    Patient Stated Goals  "i need to be more flexible"    Currently in Pain?  No/denies    Pain Score  0-No pain    Pain Location  Foot    Pain Orientation  Right;Left    Pain Descriptors / Indicators  Sharp    Pain Type  Chronic pain         [facility] PT Assessment - 09/18/18 1016      Assessment   Medical Diagnosis  Plantar fascial fibromatosis    Referring Provider (PT)  Norton BlizzardShane Hudnall, MD    Onset Date/Surgical Date  09/17/17    Next MD Visit  10/23/18    Prior Therapy  no      Precautions   Precautions  None      Balance Screen   Has the patient fallen in the past 6 months  No    Has the patient  plantarflexion and inversion strength, B gastroc and HS tightness, tenderness to the musculature of the plantar surface of the foot, and gait deviations. Patient with tendency to rest B feet in excessive eversion, indicating possible peroneal tightness and posterior tibialis weakness. Educated on gentle massage and stretching HEP- patient reported understanding. Would benefit from skilled PT services 2x/week for 8 weeks to address aforementioned impairments.    Personal Factors and Comorbidities  Age;Behavior Pattern;Comorbidity 3+;Fitness;Past/Current Experience;Profession;Time since onset of injury/illness/exacerbation    Comorbidities  HLD, GERD, atypical chest pain, arthritis, cardiac cath 2010    Examination-Activity Limitations  Squat;Stairs;Carry;Stand;Transfers;Locomotion Level    Examination-Participation Restrictions  Church;Cleaning;Shop;Community Activity;Driving;Yard Work;Interpersonal Relationship;Laundry;Meal Prep    Stability/Clinical Decision Making  Stable/Uncomplicated    Clinical Decision Making  Low    Rehab Potential  Good    PT Frequency  2x / week    PT Duration  8 weeks    PT Treatment/Interventions  ADLs/Self Care Home Management;Cryotherapy;Electrical Stimulation;Iontophoresis 4mg /ml Dexamethasone;Moist Heat;Balance training;Therapeutic exercise;Therapeutic activities;Functional mobility training;Stair training;Gait training;Ultrasound;Neuromuscular re-education;Patient/family education;Manual techniques;Vasopneumatic Device;Taping;Splinting;Orthotic Fit/Training;Energy conservation;Dry needling;Passive range of motion    PT Next Visit Plan  reassess HEP    Consulted and Agree with Plan of Care  Patient       Patient will benefit from skilled therapeutic intervention in order to improve the  following deficits and impairments:  Abnormal gait, Hypomobility, Decreased activity tolerance, Decreased strength, Increased fascial restricitons, Pain, Decreased balance, Difficulty walking, Increased muscle spasms, Improper body mechanics, Decreased range of motion, Impaired flexibility, Postural dysfunction  Visit Diagnosis: 1. Pain in left foot   2. Pain in right foot   3. Stiffness of left foot, not elsewhere classified   4. Stiffness of right foot, not elsewhere classified   5. Difficulty in walking, not elsewhere classified   6. Other symptoms and signs involving the musculoskeletal system        Problem List Patient Active Problem List   Diagnosis Date Noted  . Family history of pancreatic cancer 01/15/2018  . Coronary artery disease 08/10/2017  . Left ankle pain 07/20/2017  . Erectile dysfunction 03/21/2017  . Morbid obesity (HCC) 07/20/2015  . Annual physical exam 01/17/2015  . PCP NOTES >>> 01/17/2015  . Family history of prostate cancer 03/25/2012  . GERD 04/01/2010  . Hyperlipidemia 10/07/2007    Anette GuarneriYevgeniya Dishawn Bhargava, PT, DPT 09/18/18 1:50 PM    [facility] [place] [address]  [address] [place], [place], [address] Phone: [phone]   Fax:  [phone]  Name: Dennis SellersJoseph E Rochford Jr. MRN: [id number] Date of Birth: 10/19/1960  [facility] [place] [address]  [address] [place], [place], [address] Phone: [phone]   Fax:  [phone]  Physical Therapy Evaluation  Patient Details  Name: Dennis SellersJoseph E Monahan Jr. MRN: [id number] Date of Birth: 05/26/1960 Referring Provider (PT): Norton BlizzardShane Hudnall, MD   Encounter Date: 09/18/2018  PT End of Session - 09/18/18 1338    Visit Number  1    Number of Visits  17    Date for PT Re-Evaluation  11/13/18    Authorization Type  UHC    PT Start Time  0958    PT Stop Time  1054    PT Time Calculation (min)  56 min    Activity Tolerance  Patient tolerated treatment well    Behavior During Therapy  [facility] for tasks assessed/performed       Past Medical History:  Diagnosis Date  . Arthritis   . Atypical chest pain   . Cholelithiasis 09/2015   determined by CT, asymptomatic  . Diverticulitis   . Family history of heart disease   . GERD (gastroesophageal reflux disease)   . Hyperlipidemia   . Seasonal allergies     Past Surgical History:  Procedure Laterality Date  . CARDIAC CATHETERIZATION  2010   Dr. Allyson SabalBerry; less than 30% occlusive disease  . CHOLECYSTECTOMY N/A 06/14/2018   Procedure: LAPAROSCOPIC CHOLECYSTECTOMY;  Surgeon: Axel Filleramirez, Armando, MD;  [facility];  Service: General;  Laterality: N/A;  . TONSILLECTOMY     age 58    There were no vitals filed for this visit.   Subjective Assessment - 09/18/18 1013    Subjective  Patient reports B foot pain for the past year, with symptoms progressively worsening recently. Pain is located over B lateral and central heel as well as plantar surface of 1st and 2nd digits. Does endorse intermittent N/T in B toes. Foot pain is worse with prolonged walking, standing, stairs, yard work. Also worse in AM, and after long bouts of activity in PM. Has tried inserts but these have not helped much. Notes that he likes to walk barefoot often. Notices that he has to go down stairs sideways in  the AM d/t feeling of stretching in B HS. Mentions that he lacks flexibility.    Pertinent History  HLD, GERD, atypical chest pain, arthritis, cardiac cath 2010    Limitations  Lifting;Standing;Walking;House hold activities    How long can you stand comfortably?  1 hour    How long can you walk comfortably?  30 min    Diagnostic tests  none    Patient Stated Goals  "i need to be more flexible"    Currently in Pain?  No/denies    Pain Score  0-No pain    Pain Location  Foot    Pain Orientation  Right;Left    Pain Descriptors / Indicators  Sharp    Pain Type  Chronic pain         [facility] PT Assessment - 09/18/18 1016      Assessment   Medical Diagnosis  Plantar fascial fibromatosis    Referring Provider (PT)  Norton BlizzardShane Hudnall, MD    Onset Date/Surgical Date  09/17/17    Next MD Visit  10/23/18    Prior Therapy  no      Precautions   Precautions  None      Balance Screen   Has the patient fallen in the past 6 months  No    Has the patient  plantarflexion and inversion strength, B gastroc and HS tightness, tenderness to the musculature of the plantar surface of the foot, and gait deviations. Patient with tendency to rest B feet in excessive eversion, indicating possible peroneal tightness and posterior tibialis weakness. Educated on gentle massage and stretching HEP- patient reported understanding. Would benefit from skilled PT services 2x/week for 8 weeks to address aforementioned impairments.    Personal Factors and Comorbidities  Age;Behavior Pattern;Comorbidity 3+;Fitness;Past/Current Experience;Profession;Time since onset of injury/illness/exacerbation    Comorbidities  HLD, GERD, atypical chest pain, arthritis, cardiac cath 2010    Examination-Activity Limitations  Squat;Stairs;Carry;Stand;Transfers;Locomotion Level    Examination-Participation Restrictions  Church;Cleaning;Shop;Community Activity;Driving;Yard Work;Interpersonal Relationship;Laundry;Meal Prep    Stability/Clinical Decision Making  Stable/Uncomplicated    Clinical Decision Making  Low    Rehab Potential  Good    PT Frequency  2x / week    PT Duration  8 weeks    PT Treatment/Interventions  ADLs/Self Care Home Management;Cryotherapy;Electrical Stimulation;Iontophoresis 4mg /ml Dexamethasone;Moist Heat;Balance training;Therapeutic exercise;Therapeutic activities;Functional mobility training;Stair training;Gait training;Ultrasound;Neuromuscular re-education;Patient/family education;Manual techniques;Vasopneumatic Device;Taping;Splinting;Orthotic Fit/Training;Energy conservation;Dry needling;Passive range of motion    PT Next Visit Plan  reassess HEP    Consulted and Agree with Plan of Care  Patient       Patient will benefit from skilled therapeutic intervention in order to improve the  following deficits and impairments:  Abnormal gait, Hypomobility, Decreased activity tolerance, Decreased strength, Increased fascial restricitons, Pain, Decreased balance, Difficulty walking, Increased muscle spasms, Improper body mechanics, Decreased range of motion, Impaired flexibility, Postural dysfunction  Visit Diagnosis: 1. Pain in left foot   2. Pain in right foot   3. Stiffness of left foot, not elsewhere classified   4. Stiffness of right foot, not elsewhere classified   5. Difficulty in walking, not elsewhere classified   6. Other symptoms and signs involving the musculoskeletal system        Problem List Patient Active Problem List   Diagnosis Date Noted  . Family history of pancreatic cancer 01/15/2018  . Coronary artery disease 08/10/2017  . Left ankle pain 07/20/2017  . Erectile dysfunction 03/21/2017  . Morbid obesity (HCC) 07/20/2015  . Annual physical exam 01/17/2015  . PCP NOTES >>> 01/17/2015  . Family history of prostate cancer 03/25/2012  . GERD 04/01/2010  . Hyperlipidemia 10/07/2007    Anette GuarneriYevgeniya Dishawn Bhargava, PT, DPT 09/18/18 1:50 PM    [facility] [place] [address]  [address] [place], [place], [address] Phone: [phone]   Fax:  [phone]  Name: Dennis SellersJoseph E Rochford Jr. MRN: [id number] Date of Birth: 10/19/1960

## 2018-09-20 ENCOUNTER — Ambulatory Visit: Payer: 59 | Admitting: Physical Therapy

## 2018-09-20 ENCOUNTER — Other Ambulatory Visit: Payer: Self-pay

## 2018-09-20 ENCOUNTER — Encounter: Payer: Self-pay | Admitting: Physical Therapy

## 2018-09-20 DIAGNOSIS — R29898 Other symptoms and signs involving the musculoskeletal system: Secondary | ICD-10-CM

## 2018-09-20 DIAGNOSIS — R262 Difficulty in walking, not elsewhere classified: Secondary | ICD-10-CM

## 2018-09-20 DIAGNOSIS — M79672 Pain in left foot: Secondary | ICD-10-CM | POA: Diagnosis not present

## 2018-09-20 DIAGNOSIS — M25675 Stiffness of left foot, not elsewhere classified: Secondary | ICD-10-CM

## 2018-09-20 DIAGNOSIS — M79671 Pain in right foot: Secondary | ICD-10-CM

## 2018-09-20 DIAGNOSIS — M25674 Stiffness of right foot, not elsewhere classified: Secondary | ICD-10-CM

## 2018-09-20 NOTE — Therapy (Signed)
symptoms and signs involving the musculoskeletal system        Problem List Patient Active Problem List   Diagnosis Date Noted  . Family history of pancreatic cancer 01/15/2018  . Coronary artery disease 08/10/2017  . Left ankle pain 07/20/2017  . Erectile dysfunction 03/21/2017  . Morbid obesity (HCC) 07/20/2015  . Annual physical exam 01/17/2015  . PCP NOTES >>> 01/17/2015  . Family history of prostate cancer 03/25/2012  . GERD 04/01/2010  . Hyperlipidemia 10/07/2007    Dennis GuarneriYevgeniya Egidio Castro, PT, DPT 09/20/18 11:58 AM   [facility] [place] [address]  [address] [place], [place], [address] Phone: [phone]   Fax:  [phone]  Name: Dennis SellersJoseph E Cogliano Jr. MRN: [id number] Date of Birth: 12/29/1960  symptoms and signs involving the musculoskeletal system        Problem List Patient Active Problem List   Diagnosis Date Noted  . Family history of pancreatic cancer 01/15/2018  . Coronary artery disease 08/10/2017  . Left ankle pain 07/20/2017  . Erectile dysfunction 03/21/2017  . Morbid obesity (HCC) 07/20/2015  . Annual physical exam 01/17/2015  . PCP NOTES >>> 01/17/2015  . Family history of prostate cancer 03/25/2012  . GERD 04/01/2010  . Hyperlipidemia 10/07/2007    Dennis GuarneriYevgeniya Egidio Castro, PT, DPT 09/20/18 11:58 AM   [facility] [place] [address]  [address] [place], [place], [address] Phone: [phone]   Fax:  [phone]  Name: Dennis SellersJoseph E Cogliano Jr. MRN: [id number] Date of Birth: 12/29/1960  [facility] [place] [address]  [address] [place], [place], [address] Phone: [phone]   Fax:  [phone]  Physical Therapy Treatment  Patient Details  Name: Dennis SellersJoseph E Westergaard Jr. MRN: [id number] Date of Birth: 09/16/1960 Referring Provider (PT): Norton BlizzardShane Hudnall, MD   Encounter Date: 09/20/2018  PT End of Session - 09/20/18 1150    Visit Number  2    Number of Visits  17    Date for PT Re-Evaluation  11/13/18    Authorization Type  UHC    PT Start Time  1057    PT Stop Time  1144    PT Time Calculation (min)  47 min    Activity Tolerance  Patient tolerated treatment well    Behavior During Therapy  WFL for tasks assessed/performed       Past Medical History:  Diagnosis Date  . Arthritis   . Atypical chest pain   . Cholelithiasis 09/2015   determined by CT, asymptomatic  . Diverticulitis   . Family history of heart disease   . GERD (gastroesophageal reflux disease)   . Hyperlipidemia   . Seasonal allergies     Past Surgical History:  Procedure Laterality Date  . CARDIAC CATHETERIZATION  2010   Dr. Allyson SabalBerry; less than 30% occlusive disease  . CHOLECYSTECTOMY N/A 06/14/2018   Procedure: LAPAROSCOPIC CHOLECYSTECTOMY;  Surgeon: Axel Filleramirez, Armando, MD;  [facility];  Service: General;  Laterality: N/A;  . TONSILLECTOMY     age 6    There were no vitals filed for this visit.  Subjective Assessment - 09/20/18 1057    Subjective  Reports that things are going well. No issues with HEP.    Pertinent History  HLD, GERD, atypical chest pain, arthritis, cardiac cath 2010    Diagnostic tests  none    Patient Stated Goals  "i need to be more flexible"    Currently in Pain?  No/denies                       [facility]/Exercise - 09/20/18 0001      Exercises   Exercises  Ankle      Manual Therapy   Manual Therapy  Soft tissue mobilization;Myofascial release;Passive ROM    Manual therapy comments   prone    Soft tissue mobilization  STM to B medial and lateral gastroc-soleus complex and medial plantar surface of foot- patient with several palpable and tender trigger points throughout    Myofascial Release  manual TPR to B medial gastroc and medial plantar surface of foot    Passive ROM  B ankle dorsiflexion + great to extension to tolerance 2x30" each       Ankle Exercises: Stretches   Gastroc Stretch  30 seconds;2 reps    Gastroc Stretch Limitations  great toe on wall & staggered stance    Other Stretch  supine HS stretch with strap 2x30"      Ankle Exercises: Aerobic   Nustep  L3 x 6 min (LEs only)      Ankle Exercises: Seated   Other Seated Ankle Exercises  R & L ankle inversion with yellow TB x10 each     Other Seated Ankle Exercises  R & L big toe extension 5x10"              PT Education - 09/20/18 1150    Education Details  update to HEP; edu on use of ball

## 2018-09-25 ENCOUNTER — Ambulatory Visit: Payer: 59

## 2018-09-25 ENCOUNTER — Other Ambulatory Visit: Payer: Self-pay

## 2018-09-25 DIAGNOSIS — R262 Difficulty in walking, not elsewhere classified: Secondary | ICD-10-CM

## 2018-09-25 DIAGNOSIS — M25675 Stiffness of left foot, not elsewhere classified: Secondary | ICD-10-CM

## 2018-09-25 DIAGNOSIS — M79671 Pain in right foot: Secondary | ICD-10-CM

## 2018-09-25 DIAGNOSIS — M25674 Stiffness of right foot, not elsewhere classified: Secondary | ICD-10-CM

## 2018-09-25 DIAGNOSIS — M79672 Pain in left foot: Secondary | ICD-10-CM

## 2018-09-25 DIAGNOSIS — R29898 Other symptoms and signs involving the musculoskeletal system: Secondary | ICD-10-CM

## 2018-09-25 NOTE — Therapy (Signed)
[place] [address]  [place], [place], [address] Phone: [phone]   Fax:  [phone]  Physical Therapy Treatment  Patient Details  Name: Dennis Castro. MRN: [id number] Date of Birth: 1960-11-03 Referring Provider (PT): Karlton Lemon, MD   Encounter Date: 09/25/2018  PT End of Session - 09/25/18 0915    Visit Number  3    Number of Visits  17    Date for PT Re-Evaluation  11/13/18    Authorization Type  UHC    PT Start Time  0900    PT Stop Time  0945    PT Time Calculation (min)  45 min    Activity Tolerance  Patient tolerated treatment well    Behavior During Therapy  [facility] for tasks assessed/performed       Past Medical History:  Diagnosis Date  . Arthritis   . Atypical chest pain   . Cholelithiasis 09/2015   determined by CT, asymptomatic  . Diverticulitis   . Family history of heart disease   . GERD (gastroesophageal reflux disease)   . Hyperlipidemia   . Seasonal allergies     Past Surgical History:  Procedure Laterality Date  . CARDIAC CATHETERIZATION  2010   Dr. Gwenlyn Found; less than 30% occlusive disease  . CHOLECYSTECTOMY N/A 06/14/2018   Procedure: LAPAROSCOPIC CHOLECYSTECTOMY;  Surgeon: Ralene Ok, MD;  Location: [facility];  Service: General;  Laterality: N/A;  . TONSILLECTOMY     age 88    There were no vitals filed for this visit.  Subjective Assessment - 09/25/18 0903    Subjective  Pt. reporting he felt fine after last session and noted improved mobility at ankles while fishing [place] on [place] last night.    Pertinent History  HLD, GERD, atypical chest pain, arthritis, cardiac cath 2010    Patient Stated Goals  "i need to be more flexible"    Currently in Pain?  No/denies    Pain Score  0-No pain    Multiple Pain Sites  No                       [facility] Adult PT Treatment/Exercise - 09/25/18 0001      Manual Therapy   Manual Therapy  Soft tissue  mobilization;Myofascial release;Passive ROM    Manual therapy comments  prone    Soft tissue mobilization  STM to lateral gastroc, B peroneal     Myofascial Release  manual TPR to R mid peroneals       Ankle Exercises: Seated   Other Seated Ankle Exercises  R & L ankle inversion with red TB x10 each       Ankle Exercises: Stretches   Plantar Fascia Stretch  2 reps;30 seconds    Plantar Fascia Stretch Limitations  seated + great toe extension stretch     Gastroc Stretch  30 seconds;2 reps    Gastroc Stretch Limitations  prostretch     Other Stretch  B gastroc +peroneal stretch x 30 sec each with towel under foot     Other Stretch  supine HS stretch with strap 2x30"   + gastroc stretch      Ankle Exercises: Aerobic   Recumbent Bike  Lvl 2, 6 min       Ankle Exercises: Standing   Heel Raises  15 reps   ball squeeze between medial mal.  exam 01/17/2015  . PCP NOTES >>> 01/17/2015  . Family history of prostate cancer 03/25/2012  . GERD 04/01/2010  . Hyperlipidemia 10/07/2007    Kermit BaloMicah Dasean Brow, PTA 09/25/18 12:13 PM    [facility] [place] [address]  [address] [place], [place], [address] Phone: [phone]   Fax:  [phone]  Name: Dennis SellersJoseph E Splawn Jr. MRN: [id number] Date of Birth: 02/16/1961  [place] [address]  [place], [place], [address] Phone: [phone]   Fax:  [phone]  Physical Therapy Treatment  Patient Details  Name: Dennis Castro. MRN: [id number] Date of Birth: 1960-11-03 Referring Provider (PT): Karlton Lemon, MD   Encounter Date: 09/25/2018  PT End of Session - 09/25/18 0915    Visit Number  3    Number of Visits  17    Date for PT Re-Evaluation  11/13/18    Authorization Type  UHC    PT Start Time  0900    PT Stop Time  0945    PT Time Calculation (min)  45 min    Activity Tolerance  Patient tolerated treatment well    Behavior During Therapy  [facility] for tasks assessed/performed       Past Medical History:  Diagnosis Date  . Arthritis   . Atypical chest pain   . Cholelithiasis 09/2015   determined by CT, asymptomatic  . Diverticulitis   . Family history of heart disease   . GERD (gastroesophageal reflux disease)   . Hyperlipidemia   . Seasonal allergies     Past Surgical History:  Procedure Laterality Date  . CARDIAC CATHETERIZATION  2010   Dr. Gwenlyn Found; less than 30% occlusive disease  . CHOLECYSTECTOMY N/A 06/14/2018   Procedure: LAPAROSCOPIC CHOLECYSTECTOMY;  Surgeon: Ralene Ok, MD;  Location: [facility];  Service: General;  Laterality: N/A;  . TONSILLECTOMY     age 88    There were no vitals filed for this visit.  Subjective Assessment - 09/25/18 0903    Subjective  Pt. reporting he felt fine after last session and noted improved mobility at ankles while fishing on slope on bank last night.    Pertinent History  HLD, GERD, atypical chest pain, arthritis, cardiac cath 2010    Patient Stated Goals  "i need to be more flexible"    Currently in Pain?  No/denies    Pain Score  0-No pain    Multiple Pain Sites  No                       [facility] Adult PT Treatment/Exercise - 09/25/18 0001      Manual Therapy   Manual Therapy  Soft tissue  mobilization;Myofascial release;Passive ROM    Manual therapy comments  prone    Soft tissue mobilization  STM to lateral gastroc, B peroneal     Myofascial Release  manual TPR to R mid peroneals       Ankle Exercises: Seated   Other Seated Ankle Exercises  R & L ankle inversion with red TB x10 each       Ankle Exercises: Stretches   Plantar Fascia Stretch  2 reps;30 seconds    Plantar Fascia Stretch Limitations  seated + great toe extension stretch     Gastroc Stretch  30 seconds;2 reps    Gastroc Stretch Limitations  prostretch     Other Stretch  B gastroc +peroneal stretch x 30 sec each with towel under foot     Other Stretch  supine HS stretch with strap 2x30"   + gastroc stretch      Ankle Exercises: Aerobic   Recumbent Bike  Lvl 2, 6 min       Ankle Exercises: Standing   Heel Raises  15 reps   ball squeeze between medial mal.

## 2018-09-27 ENCOUNTER — Encounter: Payer: Self-pay | Admitting: Internal Medicine

## 2018-09-27 ENCOUNTER — Ambulatory Visit: Payer: 59 | Admitting: Internal Medicine

## 2018-09-27 ENCOUNTER — Other Ambulatory Visit: Payer: Self-pay

## 2018-09-27 VITALS — BP 113/71 | HR 60 | Temp 97.9°F | Resp 16 | Ht 77.0 in | Wt 312.0 lb

## 2018-09-27 DIAGNOSIS — M79672 Pain in left foot: Secondary | ICD-10-CM

## 2018-09-27 DIAGNOSIS — M79671 Pain in right foot: Secondary | ICD-10-CM | POA: Diagnosis not present

## 2018-09-27 DIAGNOSIS — R21 Rash and other nonspecific skin eruption: Secondary | ICD-10-CM

## 2018-09-27 DIAGNOSIS — E785 Hyperlipidemia, unspecified: Secondary | ICD-10-CM

## 2018-09-27 MED ORDER — BETAMETHASONE DIPROPIONATE AUG 0.05 % EX CREA: TOPICAL_CREAM | Freq: Two times a day (BID) | CUTANEOUS | 0 refills | Status: DC

## 2018-09-27 NOTE — Progress Notes (Signed)
Pre visit review using our clinic review tool, if applicable. No additional management support is needed unless otherwise documented below in the visit note. 

## 2018-09-27 NOTE — Progress Notes (Signed)
Subjective:    Patient ID: Dennis Castro., male    DOB: 02/06/1961, 58 y.o.   MRN: [id number]  DOS:  09/27/2018 Type of visit - description: rov  In general feeling well, no major concerns. Rash: See last visit, still has a rash on and off, occasionally hurts, no itching.  Gets better with topical steroids. Feet pain: Getting better. Obesity: Regained some weight lately but he is working on getting back on track.  Review of Systems Other than above, he has no other concerns and feels well.  Past Medical History:  Diagnosis Date  . Arthritis   . Atypical chest pain   . Cholelithiasis 09/2015   determined by CT, asymptomatic  . Diverticulitis   . Family history of heart disease   . GERD (gastroesophageal reflux disease)   . Hyperlipidemia   . Seasonal allergies     Past Surgical History:  Procedure Laterality Date  . CARDIAC CATHETERIZATION  2010   Dr. Allyson Sabal; less than 30% occlusive disease  . CHOLECYSTECTOMY N/A 06/14/2018   Procedure: LAPAROSCOPIC CHOLECYSTECTOMY;  Surgeon: Axel Filler, MD;  Location: [facility] OR;  Service: General;  Laterality: N/A;  . TONSILLECTOMY     age 85    Social History   Socioeconomic History  . Marital status: Married    Spouse name: Not on file  . Number of children: 1  . Years of education: Not on file  . Highest education level: Not on file  Occupational History  . Occupation: Production designer, theatre/television/film, office job  Social Needs  . Financial resource strain: Not on file  . Food insecurity    Worry: Not on file    Inability: Not on file  . Transportation needs    Medical: Not on file    Non-medical: Not on file  Tobacco Use  . Smoking status: Never Smoker  . Smokeless tobacco: Never Used  Substance and Sexual Activity  . Alcohol use: No  . Drug use: No  . Sexual activity: Yes  Lifestyle  . Physical activity    Days per week: Not on file    Minutes per session: Not on file  . Stress: Not on file  Relationships  . Social Manufacturing systems engineer on phone: Not on file    Gets together: Not on file    Attends religious service: Not on file    Active member of club or organization: Not on file    Attends meetings of clubs or organizations: Not on file    Relationship status: Not on file  . Intimate partner violence    Fear of current or ex partner: Not on file    Emotionally abused: Not on file    Physically abused: Not on file    Forced sexual activity: Not on file  Other Topics Concern  . 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 If you have any questions, ask your nurse or doctor.        aspirin EC 81 MG tablet Take 81 mg by mouth daily.   atorvastatin 20 MG tablet Commonly known as: LIPITOR   fluticasone 50 MCG/ACT nasal spray Commonly known as: FLONASE   hydrocortisone 2.5 % cream Apply topically 2 (two) times daily. For the next 10 days  Objective:   Physical Exam BP 113/71 (BP Location: Left Arm, Patient Position: Sitting, Cuff Size: Normal)   Pulse 60   Temp 97.9 F (36.6 C) (Oral)   Resp 16   Ht 6\' 5"  (1.956 m)   Wt (!) 312 lb (141.5 kg)   SpO2 99%   BMI 37.00 kg/m  General:   Well developed, NAD, BMI noted. HEENT:  Normocephalic . Face symmetric, atraumatic Lungs:  CTA B Normal respiratory effort, no intercostal retractions, no accessory muscle use. Heart: RRR,  no murmur.  No pretibial edema bilaterally  Skin: See picture of the rash on the right upper quadrant/right chest Neurologic:  alert & oriented X3.  Speech normal, gait appropriate for age and unassisted Psych--  Cognition and judgment appear intact.  Cooperative with normal attention span and concentration.  Behavior appropriate. No anxious or depressed appearing.        Assessment     Assessment   Hyperlipidemia before  GERD Obesity  CAD:  --Chest pain: Myoview  stress test subtle anterior ischemia --Cardiac cath 06-24-09: Noncritical 30% LAD with subtle anteroapical HK. Diverticulitis Seasonal allergies +FH prostate ca (father age 19), CAD (mother age 73) GB-kidney  Stones  PLAN: Hyperlipidemia: Controlled.  Labs on RTC Obesity: Was doing really well with weight control, has gain weight lately but is getting back on track. Rash: Was seen in April virtually, dx w/  shingles however on today's exam the rash persist, eczema?  Change from hydrocortisone 2% to a stronger cream.  Reassess on RTC FH pancreatic cancer: failed  genetic clinic referral (likely due to COVID-19 quarantine)  reassess at the next opportunity. Plantar fascial fibromatosis,   see last visit , had feet pain, saw sports medicine, subsequently physical therapy.  Getting better. Recommend early flu shot RTC already scheduled for CPX 01-2019

## 2018-09-27 NOTE — Patient Instructions (Signed)
See you in October  Use the new cream twice a day x 1 week then as needed  Get a flu shot by September or october

## 2018-09-29 NOTE — Assessment & Plan Note (Signed)
Hyperlipidemia: Controlled.  Labs on RTC Obesity: Was doing really well with weight control, has gain weight lately but is getting back on track. Rash: Was seen in April virtually, dx w/  shingles however on today's exam the rash persist, eczema?  Change from hydrocortisone 2% to a stronger cream.  Reassess on RTC FH pancreatic cancer: failed  genetic clinic referral (likely due to COVID-19 quarantine)  reassess at the next opportunity. Plantar fascial fibromatosis,   see last visit , had feet pain, saw sports medicine, subsequently physical therapy.  Getting better. Recommend early flu shot RTC already scheduled for CPX 01-2019

## 2018-10-01 ENCOUNTER — Other Ambulatory Visit: Payer: Self-pay

## 2018-10-01 ENCOUNTER — Ambulatory Visit: Payer: 59

## 2018-10-01 DIAGNOSIS — R29898 Other symptoms and signs involving the musculoskeletal system: Secondary | ICD-10-CM

## 2018-10-01 DIAGNOSIS — R262 Difficulty in walking, not elsewhere classified: Secondary | ICD-10-CM

## 2018-10-01 DIAGNOSIS — M25674 Stiffness of right foot, not elsewhere classified: Secondary | ICD-10-CM

## 2018-10-01 DIAGNOSIS — M79672 Pain in left foot: Secondary | ICD-10-CM | POA: Diagnosis not present

## 2018-10-01 DIAGNOSIS — M25675 Stiffness of left foot, not elsewhere classified: Secondary | ICD-10-CM

## 2018-10-01 DIAGNOSIS — M79671 Pain in right foot: Secondary | ICD-10-CM

## 2018-10-01 NOTE — Therapy (Signed)
[facility] [address]  [address] [place], [place], [address] Phone: [phone]   Fax:  [phone]  Physical Therapy Treatment  Patient Details  Name: Dennis SellersJoseph E Godeaux Castro. MRN: [id number] Date of Birth: 10/07/1960 Referring Provider (PT): Norton BlizzardShane Hudnall, MD   Encounter Date: 10/01/2018  PT End of Session - 10/01/18 1303    Visit Number  4    Number of Visits  17    Date for PT Re-Evaluation  11/13/18    Authorization Type  UHC    PT Start Time  1300    PT Stop Time  1345    PT Time Calculation (min)  45 min    Activity Tolerance  Patient tolerated treatment well    Behavior During Therapy  [facility]       Past Medical History:  Diagnosis Date  . Arthritis   . Atypical chest pain   . Cholelithiasis 09/2015   determined by CT, asymptomatic  . Diverticulitis   . Family history of heart disease   . GERD (gastroesophageal reflux disease)   . Hyperlipidemia   . Seasonal allergies     Past Surgical History:  Procedure Laterality Date  . CARDIAC CATHETERIZATION  2010   Dr. Allyson SabalBerry; less than 30% occlusive disease  . CHOLECYSTECTOMY N/A 06/14/2018   Procedure: LAPAROSCOPIC CHOLECYSTECTOMY;  Surgeon: Axel Filleramirez, Armando, MD;  [facility];  Service: General;  Laterality: N/A;  . TONSILLECTOMY     age 58    There were no vitals filed for this visit.  Subjective Assessment - 10/01/18 1302    Subjective  Pt. reporting he was able to push mow on a slope over weekend without soreness over weekend.    Pertinent History  HLD, GERD, atypical chest pain, arthritis, cardiac cath 2010    Diagnostic tests  none    Patient Stated Goals  "i need to be more flexible"    Currently in Pain?  No/denies    Pain Score  0-No pain    Multiple Pain Sites  No                       [facility] Adult PT Treatment/Exercise - 10/01/18 0001      Neuro Re-ed    Neuro Re-ed Details   Alternating cone nock  over/righting 2 x 7 cones; focusing on SLS stability       Ankle Exercises: Stretches   Plantar Fascia Stretch  2 reps;30 seconds    Plantar Fascia Stretch Limitations  seated + great toe extension stretch     Gastroc Stretch  30 seconds;2 reps    Gastroc Stretch Limitations  prostretch     Slant Board Stretch  1 rep;30 seconds    Slant Board Stretch Limitations  Standing great toe extension stretch at wall x 30 sec     Other Stretch  B gastroc +peroneal stretch x 30 sec each with towel under foot     Other Stretch  supine HS stretch with strap 2x30"      Ankle Exercises: Standing   SLS  B SLS 2 x 20 sec     Heel Raises  20 reps   with adduction ball squeeze between medial ankles     Ankle Exercises: Machines for Strengthening   Cybex Leg Press  bent knee calf raise 35# x 15 reps; leg press toes off platform 35# x 15 reps  [facility] [address]  [address] [place], [place], [address] Phone: [phone]   Fax:  [phone]  Physical Therapy Treatment  Patient Details  Name: Dennis SellersJoseph E Godeaux Castro. MRN: [id number] Date of Birth: 10/07/1960 Referring Provider (PT): Norton BlizzardShane Hudnall, MD   Encounter Date: 10/01/2018  PT End of Session - 10/01/18 1303    Visit Number  4    Number of Visits  17    Date for PT Re-Evaluation  11/13/18    Authorization Type  UHC    PT Start Time  1300    PT Stop Time  1345    PT Time Calculation (min)  45 min    Activity Tolerance  Patient tolerated treatment well    Behavior During Therapy  [facility] for tasks assessed/performed       Past Medical History:  Diagnosis Date  . Arthritis   . Atypical chest pain   . Cholelithiasis 09/2015   determined by CT, asymptomatic  . Diverticulitis   . Family history of heart disease   . GERD (gastroesophageal reflux disease)   . Hyperlipidemia   . Seasonal allergies     Past Surgical History:  Procedure Laterality Date  . CARDIAC CATHETERIZATION  2010   Dr. Allyson SabalBerry; less than 30% occlusive disease  . CHOLECYSTECTOMY N/A 06/14/2018   Procedure: LAPAROSCOPIC CHOLECYSTECTOMY;  Surgeon: Axel Filleramirez, Armando, MD;  [facility];  Service: General;  Laterality: N/A;  . TONSILLECTOMY     age 58    There were no vitals filed for this visit.  Subjective Assessment - 10/01/18 1302    Subjective  Pt. reporting he was able to push mow on a slope over weekend without soreness over weekend.    Pertinent History  HLD, GERD, atypical chest pain, arthritis, cardiac cath 2010    Diagnostic tests  none    Patient Stated Goals  "i need to be more flexible"    Currently in Pain?  No/denies    Pain Score  0-No pain    Multiple Pain Sites  No                       [facility] Adult PT Treatment/Exercise - 10/01/18 0001      Neuro Re-ed    Neuro Re-ed Details   Alternating cone nock  over/righting 2 x 7 cones; focusing on SLS stability       Ankle Exercises: Stretches   Plantar Fascia Stretch  2 reps;30 seconds    Plantar Fascia Stretch Limitations  seated + great toe extension stretch     Gastroc Stretch  30 seconds;2 reps    Gastroc Stretch Limitations  prostretch     Slant Board Stretch  1 rep;30 seconds    Slant Board Stretch Limitations  Standing great toe extension stretch at wall x 30 sec     Other Stretch  B gastroc +peroneal stretch x 30 sec each with towel under foot     Other Stretch  supine HS stretch with strap 2x30"      Ankle Exercises: Standing   SLS  B SLS 2 x 20 sec     Heel Raises  20 reps   with adduction ball squeeze between medial ankles     Ankle Exercises: Machines for Strengthening   Cybex Leg Press  bent knee calf raise 35# x 15 reps; leg press toes off platform 35# x 15 reps  Stiffness of right foot, not elsewhere classified   5. Difficulty in walking, not elsewhere classified   6. Other symptoms and signs involving the musculoskeletal system        Problem List Patient Active Problem List   Diagnosis Date Noted  . Family history of pancreatic cancer 01/15/2018  . Coronary artery disease 08/10/2017  . Left ankle pain 07/20/2017  . Erectile dysfunction 03/21/2017  . Morbid obesity ([place]) 07/20/2015  . Annual physical exam 01/17/2015  . PCP NOTES >>> 01/17/2015  . Family history of prostate cancer 03/25/2012  . GERD 04/01/2010  . Hyperlipidemia 10/07/2007    Bess Harvest, PTA 10/01/18 1:56 PM    [facility] [address]  [place], [place], [address] Phone: [phone]   Fax:  [phone]  Name: Azam Gervasi. MRN: [id number] Date of Birth: 1961/01/21

## 2018-10-03 ENCOUNTER — Ambulatory Visit: Payer: 59 | Attending: Family Medicine | Admitting: Physical Therapy

## 2018-10-03 ENCOUNTER — Encounter: Payer: Self-pay | Admitting: Physical Therapy

## 2018-10-03 ENCOUNTER — Other Ambulatory Visit: Payer: Self-pay

## 2018-10-03 DIAGNOSIS — R262 Difficulty in walking, not elsewhere classified: Secondary | ICD-10-CM | POA: Insufficient documentation

## 2018-10-03 DIAGNOSIS — M79672 Pain in left foot: Secondary | ICD-10-CM | POA: Diagnosis not present

## 2018-10-03 DIAGNOSIS — M79671 Pain in right foot: Secondary | ICD-10-CM | POA: Insufficient documentation

## 2018-10-03 DIAGNOSIS — R29898 Other symptoms and signs involving the musculoskeletal system: Secondary | ICD-10-CM

## 2018-10-03 DIAGNOSIS — M25674 Stiffness of right foot, not elsewhere classified: Secondary | ICD-10-CM | POA: Diagnosis present

## 2018-10-03 DIAGNOSIS — M25675 Stiffness of left foot, not elsewhere classified: Secondary | ICD-10-CM | POA: Diagnosis present

## 2018-10-03 NOTE — Therapy (Signed)
[facility] [address]  [place], [place], [address] Phone: [phone]   Fax:  [phone]  Physical Therapy Treatment  Patient Details  Name: Dennis Castro. MRN: [id number] Date of Birth: [birth year] Referring Provider (PT): Karlton Lemon, MD   Encounter Date: 10/03/2018  PT End of Session - 10/03/18 0933    Visit Number  5    Number of Visits  17    Date for PT Re-Evaluation  11/13/18    Authorization Type  UHC    PT Start Time  0848    PT Stop Time  0931    PT Time Calculation (min)  43 min    Activity Tolerance  Patient tolerated treatment well    Behavior During Therapy  [facility] for tasks assessed/performed       Past Medical History:  Diagnosis Date  . Arthritis   . Atypical chest pain   . Cholelithiasis 09/2015   determined by CT, asymptomatic  . Diverticulitis   . Family history of heart disease   . GERD (gastroesophageal reflux disease)   . Hyperlipidemia   . Seasonal allergies     Past Surgical History:  Procedure Laterality Date  . CARDIAC CATHETERIZATION  2010   Dr. Gwenlyn Found; less than 30% occlusive disease  . CHOLECYSTECTOMY N/A 06/14/2018   Procedure: LAPAROSCOPIC CHOLECYSTECTOMY;  Surgeon: Ralene Ok, MD;  Location: [facility];  Service: General;  Laterality: N/A;  . TONSILLECTOMY     age 9    There were no vitals filed for this visit.  Subjective Assessment - 10/03/18 0849    Subjective  Reports that his pain is getting better. Didnt have as much sharp pain this AM.    Pertinent History  HLD, GERD, atypical chest pain, arthritis, cardiac cath 2010    Diagnostic tests  none    Patient Stated Goals  "i need to be more flexible"    Currently in Pain?  No/denies                       [facility] Adult PT Treatment/Exercise - 10/03/18 0001      Manual Therapy   Manual Therapy  Taping    Kinesiotex  Create Space      Kinesiotix   Create Space  R & L 3 strip plantar  fasciitis taping pattern with 3 strips at 50% stretch      Ankle Exercises: Aerobic   Nustep  L4 x 6 min (LEs only)      Ankle Exercises: Seated   Other Seated Ankle Exercises  R & L ankle inversion with red TB x10 each    trouble going past neutral into inversion     Ankle Exercises: Sidelying   Ankle Inversion  Strengthening;Right;Left;15 reps   2#   Ankle Eversion  Strengthening;Right;Left;15 reps   2#     Ankle Exercises: Stretches   Plantar Fascia Stretch  2 reps;30 seconds    Plantar Fascia Stretch Limitations  toes up on wall; each LE    Other Stretch  supine HS stretch with strap 2x30" each LE      Ankle Exercises: Standing   Heel Raises  Right;Left;15 reps   2 sets eccentric heel raise at [place]            PT Education - 10/03/18 0931    Education Details  update to HEP, edu on KT tape wear time, precautions, removal    Person(s)  Problem List Patient Active Problem List   Diagnosis Date Noted  . Family history of pancreatic cancer 01/15/2018  . Coronary artery disease 08/10/2017  . Left ankle pain 07/20/2017  . Erectile dysfunction 03/21/2017  . Morbid obesity ([facility]) 07/20/2015  . Annual physical exam 01/17/2015  . PCP NOTES >>> 01/17/2015  . Family history of prostate cancer 03/25/2012  . GERD 04/01/2010  . Hyperlipidemia 10/07/2007     Anette GuarneriYevgeniya Elsy Chiang, PT, DPT 10/03/18 9:35 AM   [facility] [address]  [address] [place], [place], [address] Phone: [phone]   Fax:  [phone]  Name: Dennis Castro. MRN: [id number] Date of Birth: 05/14/1960  [facility] [address]  [place], [place], [address] Phone: [phone]   Fax:  [phone]  Physical Therapy Treatment  Patient Details  Name: Dennis Castro. MRN: [id number] Date of Birth: [birth year] Referring Provider (PT): Karlton Lemon, MD   Encounter Date: 10/03/2018  PT End of Session - 10/03/18 0933    Visit Number  5    Number of Visits  17    Date for PT Re-Evaluation  11/13/18    Authorization Type  UHC    PT Start Time  0848    PT Stop Time  0931    PT Time Calculation (min)  43 min    Activity Tolerance  Patient tolerated treatment well    Behavior During Therapy  [facility] for tasks assessed/performed       Past Medical History:  Diagnosis Date  . Arthritis   . Atypical chest pain   . Cholelithiasis 09/2015   determined by CT, asymptomatic  . Diverticulitis   . Family history of heart disease   . GERD (gastroesophageal reflux disease)   . Hyperlipidemia   . Seasonal allergies     Past Surgical History:  Procedure Laterality Date  . CARDIAC CATHETERIZATION  2010   Dr. Gwenlyn Found; less than 30% occlusive disease  . CHOLECYSTECTOMY N/A 06/14/2018   Procedure: LAPAROSCOPIC CHOLECYSTECTOMY;  Surgeon: Ralene Ok, MD;  Location: [facility];  Service: General;  Laterality: N/A;  . TONSILLECTOMY     age 9    There were no vitals filed for this visit.  Subjective Assessment - 10/03/18 0849    Subjective  Reports that his pain is getting better. Didnt have as much sharp pain this AM.    Pertinent History  HLD, GERD, atypical chest pain, arthritis, cardiac cath 2010    Diagnostic tests  none    Patient Stated Goals  "i need to be more flexible"    Currently in Pain?  No/denies                       [facility] Adult PT Treatment/Exercise - 10/03/18 0001      Manual Therapy   Manual Therapy  Taping    Kinesiotex  Create Space      Kinesiotix   Create Space  R & L 3 strip plantar  fasciitis taping pattern with 3 strips at 50% stretch      Ankle Exercises: Aerobic   Nustep  L4 x 6 min (LEs only)      Ankle Exercises: Seated   Other Seated Ankle Exercises  R & L ankle inversion with red TB x10 each    trouble going past neutral into inversion     Ankle Exercises: Sidelying   Ankle Inversion  Strengthening;Right;Left;15 reps   2#   Ankle Eversion  Strengthening;Right;Left;15 reps   2#     Ankle Exercises: Stretches   Plantar Fascia Stretch  2 reps;30 seconds    Plantar Fascia Stretch Limitations  toes up on wall; each LE    Other Stretch  supine HS stretch with strap 2x30" each LE      Ankle Exercises: Standing   Heel Raises  Right;Left;15 reps   2 sets eccentric heel raise at [place]            PT Education - 10/03/18 0931    Education Details  update to HEP, edu on KT tape wear time, precautions, removal    Person(s)

## 2018-10-07 ENCOUNTER — Ambulatory Visit: Payer: 59

## 2018-10-07 ENCOUNTER — Other Ambulatory Visit: Payer: Self-pay

## 2018-10-07 DIAGNOSIS — M79672 Pain in left foot: Secondary | ICD-10-CM | POA: Diagnosis not present

## 2018-10-07 DIAGNOSIS — R29898 Other symptoms and signs involving the musculoskeletal system: Secondary | ICD-10-CM

## 2018-10-07 DIAGNOSIS — M79671 Pain in right foot: Secondary | ICD-10-CM

## 2018-10-07 DIAGNOSIS — M25675 Stiffness of left foot, not elsewhere classified: Secondary | ICD-10-CM

## 2018-10-07 DIAGNOSIS — M25674 Stiffness of right foot, not elsewhere classified: Secondary | ICD-10-CM

## 2018-10-07 DIAGNOSIS — R262 Difficulty in walking, not elsewhere classified: Secondary | ICD-10-CM

## 2018-10-07 NOTE — Therapy (Signed)
[facility] [address]  [address] [place], [place], [address] Phone: [phone]   Fax:  [phone]  Physical Therapy Treatment  Patient Details  Name: Dennis Castro. MRN: [id number] Date of Birth: 1960/09/19 Referring Provider (PT): Norton Blizzard, MD   Encounter Date: 10/07/2018  PT End of Session - 10/07/18 0854    Visit Number  6    Number of Visits  17    Date for PT Re-Evaluation  11/13/18    Authorization Type  UHC    PT Start Time  0848    PT Stop Time  0938    PT Time Calculation (min)  50 min    Activity Tolerance  Patient tolerated treatment well    Behavior During Therapy  [facility] for tasks assessed/performed       Past Medical History:  Diagnosis Date  . Arthritis   . Atypical chest pain   . Cholelithiasis 09/2015   determined by CT, asymptomatic  . Diverticulitis   . Family history of heart disease   . GERD (gastroesophageal reflux disease)   . Hyperlipidemia   . Seasonal allergies     Past Surgical History:  Procedure Laterality Date  . CARDIAC CATHETERIZATION  2010   Dr. Allyson Sabal; less than 30% occlusive disease  . CHOLECYSTECTOMY N/A 06/14/2018   Procedure: LAPAROSCOPIC CHOLECYSTECTOMY;  Surgeon: Axel Filler, MD;  Location: [facility] OR;  Service: General;  Laterality: N/A;  . TONSILLECTOMY     age 5    There were no vitals filed for this visit.  Subjective Assessment - 10/07/18 0851    Subjective  Pt. noting ~ 6 hour yardwork on Saturday and Sunday.  Noted some B foot arch soreness and heel pain after this.    Pertinent History  HLD, GERD, atypical chest pain, arthritis, cardiac cath 2010    Patient Stated Goals  "i need to be more flexible"    Currently in Pain?  No/denies    Pain Score  0-No pain   up to 7/10 at worst after ~ 6 hours of yardwork yesterday   Pain Location  Foot    Pain Orientation  Right;Left    Pain Descriptors / Indicators  Sharp    Pain Type  Chronic pain    Multiple  Pain Sites  No                       [facility] Adult PT Treatment/Exercise - 10/07/18 0001      Ankle Exercises: Aerobic   Recumbent Bike  Lvl 2, 6 min       Ankle Exercises: Seated   Other Seated Ankle Exercises  --    Other Seated Ankle Exercises  Seated B arch creation 3" x 10 reps; cues required for proper motion       Ankle Exercises: Stretches   Plantar Fascia Stretch  2 reps;30 seconds    Plantar Fascia Stretch Limitations  toes up on wall; each LE    Slant Board Stretch Limitations  Standing great toe extension stretch at wall x 30 sec     Other Stretch  B gastroc +peroneal stretch x 30 sec each with towel under foot     Other Stretch  supine HS stretch with strap 2x30" each LE      Ankle Exercises: Supine   T-Band  B ankle IV, DF with red TB x 20 rpes     Other Supine Ankle Exercises  Supine ankle EV with yellow looped TB at forefoot 2 x 15 reps       Ankle Exercises: Standing   SLS  B SLS x 20 sec + head turns; pt. unable to maintain balance with L SLS thus instructed to only perform head turns at home on R SLS balance    Heel Raises  Right;Left;15 reps   1 set; B con/R + L eccentric lowering alternating R/L   Other Standing Ankle Exercises  Side stepping with yellow looped TB at forefoot 3 x laps down/back on foam   Cues for neutral ankle alignment    Other Standing Ankle Exercises  Alternating cone nock over/righting  of cones 2 x 7 cones each LE; cues provided for neutral ankle alignment                PT Short Term Goals - 10/03/18 0934      PT SHORT TERM GOAL #1   Title  Patient to be independent with initial HEP.    Time  4    Period  Weeks    Status  Achieved    Target Date  10/16/18        PT Long Term Goals - 09/20/18 1156      PT LONG TERM GOAL #1   Title  Patient to be independent with advanced HEP.    Time  8    Period  Weeks    Status  On-going      PT LONG TERM GOAL #2   Title  Patient to demonstrate B ankle strength  >=4+/5.    Time  8    Period  Weeks    Status  On-going      PT LONG TERM GOAL #3   Title  Patient to demonstrate B ankle AROM WFL.    Time  8    Period  Weeks    Status  On-going      PT LONG TERM GOAL #4   Title  Patient to report tolerance of walking for 1.5 hours without pain limiting.    Time  8    Period  Weeks    Status  On-going      PT LONG TERM GOAL #5   Title  Patient to report tolerance for 2 hours of yard work without onset of pain.    Time  8    Period  Weeks    Status  On-going            Plan - 10/07/18 0913    Clinical Impression Statement  Pt. noting "some" benefit from taping last session however opting not to reapply taping today.  Progressed SLS and dynamic balance activities today however required increased cueing to maintain neutral ankle alignment with SLS.  Did note some ankle/arch soreness over weekend however admits to working "in yard" for ~ 6 hours Saturday and Sunday each.  Will continue to progress toward goals.    Personal Factors and Comorbidities  Age;Behavior Pattern;Comorbidity 3+;Fitness;Past/Current Experience;Profession;Time since onset of injury/illness/exacerbation    Comorbidities  HLD, GERD, atypical chest pain, arthritis, cardiac cath 2010    Rehab Potential  Good    PT Treatment/Interventions  ADLs/Self Care Home Management;Cryotherapy;Electrical Stimulation;Iontophoresis 4mg /ml Dexamethasone;Moist Heat;Balance training;Therapeutic exercise;Therapeutic activities;Functional mobility training;Stair training;Gait training;Ultrasound;Neuromuscular re-education;Patient/family education;Manual techniques;Vasopneumatic Device;Taping;Splinting;Orthotic Fit/Training;Energy conservation;Dry needling;Passive range of motion    PT Next Visit Plan  progress ankle and great toe stretching    Consulted and Agree with Plan of Care  Patient  Patient will benefit from skilled therapeutic intervention in order to improve the following deficits  and impairments:  Abnormal gait, Hypomobility, Decreased activity tolerance, Decreased strength, Increased fascial restricitons, Pain, Decreased balance, Difficulty walking, Increased muscle spasms, Improper body mechanics, Decreased range of motion, Impaired flexibility, Postural dysfunction  Visit Diagnosis: 1. Pain in left foot   2. Pain in right foot   3. Stiffness of left foot, not elsewhere classified   4. Stiffness of right foot, not elsewhere classified   5. Difficulty in walking, not elsewhere classified   6. Other symptoms and signs involving the musculoskeletal system        Problem List Patient Active Problem List   Diagnosis Date Noted  . Family history of pancreatic cancer 01/15/2018  . Coronary artery disease 08/10/2017  . Left ankle pain 07/20/2017  . Erectile dysfunction 03/21/2017  . Morbid obesity (HCC) 07/20/2015  . Annual physical exam 01/17/2015  . PCP NOTES >>> 01/17/2015  . Family history of prostate cancer 03/25/2012  . GERD 04/01/2010  . Hyperlipidemia 10/07/2007    Kermit Balo, PTA 10/07/18 10:01 AM  [facility] [address]  [address] [place], [place], [address] Phone: [phone]   Fax:  [phone]  Name: Dennis Castro. MRN: [id number] Date of Birth: [birth year]

## 2018-10-10 ENCOUNTER — Ambulatory Visit: Payer: 59 | Admitting: Physical Therapy

## 2018-10-10 ENCOUNTER — Encounter: Payer: Self-pay | Admitting: Physical Therapy

## 2018-10-10 ENCOUNTER — Other Ambulatory Visit: Payer: Self-pay

## 2018-10-10 DIAGNOSIS — M25675 Stiffness of left foot, not elsewhere classified: Secondary | ICD-10-CM

## 2018-10-10 DIAGNOSIS — R29898 Other symptoms and signs involving the musculoskeletal system: Secondary | ICD-10-CM

## 2018-10-10 DIAGNOSIS — M79672 Pain in left foot: Secondary | ICD-10-CM | POA: Diagnosis not present

## 2018-10-10 DIAGNOSIS — M79671 Pain in right foot: Secondary | ICD-10-CM

## 2018-10-10 DIAGNOSIS — R262 Difficulty in walking, not elsewhere classified: Secondary | ICD-10-CM

## 2018-10-10 DIAGNOSIS — M25674 Stiffness of right foot, not elsewhere classified: Secondary | ICD-10-CM

## 2018-10-10 NOTE — Therapy (Signed)
Difficulty in walking, not elsewhere classified   6. Other symptoms and signs involving the musculoskeletal system        Problem List Patient Active Problem List   Diagnosis Date Noted  . Family history of pancreatic cancer 01/15/2018  . Coronary artery disease 08/10/2017  . Left ankle pain 07/20/2017  . Erectile dysfunction 03/21/2017  . Morbid obesity ([place]) 07/20/2015  . Annual physical exam 01/17/2015  . PCP NOTES >>> 01/17/2015  . Family history of prostate cancer 03/25/2012  . GERD 04/01/2010  . Hyperlipidemia 10/07/2007     Janene Harvey, PT, DPT 10/10/18 9:49 AM   [facility] [address]  [place], [place], [address] Phone: [phone]   Fax:  [phone]  Name: Dennis Castro. MRN: [id number] Date of Birth: 02/07/1961  Difficulty in walking, not elsewhere classified   6. Other symptoms and signs involving the musculoskeletal system        Problem List Patient Active Problem List   Diagnosis Date Noted  . Family history of pancreatic cancer 01/15/2018  . Coronary artery disease 08/10/2017  . Left ankle pain 07/20/2017  . Erectile dysfunction 03/21/2017  . Morbid obesity ([place]) 07/20/2015  . Annual physical exam 01/17/2015  . PCP NOTES >>> 01/17/2015  . Family history of prostate cancer 03/25/2012  . GERD 04/01/2010  . Hyperlipidemia 10/07/2007     Janene Harvey, PT, DPT 10/10/18 9:49 AM   [facility] [address]  [place], [place], [address] Phone: [phone]   Fax:  [phone]  Name: Dennis Castro. MRN: [id number] Date of Birth: 02/07/1961  [facility] [address]  [address] [place], [place], [address] Phone: [phone]   Fax:  [phone]  Physical Therapy Treatment  Patient Details  Name: Dennis SellersJoseph E Blakeney Jr. MRN: [id number] Date of Birth: 07/08/1960 Referring Provider (PT): Norton BlizzardShane Hudnall, MD   Encounter Date: 10/10/2018  PT End of Session - 10/10/18 0928    Visit Number  7    Number of Visits  17    Date for PT Re-Evaluation  11/13/18    Authorization Type  UHC    PT Start Time  0849    PT Stop Time  0927    PT Time Calculation (min)  38 min    Activity Tolerance  Patient tolerated treatment well    Behavior During Therapy  [facility] for tasks assessed/performed       Past Medical History:  Diagnosis Date  . Arthritis   . Atypical chest pain   . Cholelithiasis 09/2015   determined by CT, asymptomatic  . Diverticulitis   . Family history of heart disease   . GERD (gastroesophageal reflux disease)   . Hyperlipidemia   . Seasonal allergies     Past Surgical History:  Procedure Laterality Date  . CARDIAC CATHETERIZATION  2010   Dr. Allyson SabalBerry; less than 30% occlusive disease  . CHOLECYSTECTOMY N/A 06/14/2018   Procedure: LAPAROSCOPIC CHOLECYSTECTOMY;  Surgeon: Axel Filleramirez, Armando, MD;  [facility];  Service: General;  Laterality: N/A;  . TONSILLECTOMY     age 66    There were no vitals filed for this visit.  Subjective Assessment - 10/10/18 0852    Subjective  Reports that everything is going well. Having some HS tightness this AM because he didnt get to stretch yesterday.    Pertinent History  HLD, GERD, atypical chest pain, arthritis, cardiac cath 2010    Diagnostic tests  none    Patient Stated Goals  "i need to be more flexible"    Currently in Pain?  No/denies                       [facility] Adult PT Treatment/Exercise - 10/10/18 0001      Ankle Exercises: Aerobic   Nustep  L5 x 6 min (LEs only)      Ankle Exercises: Stretches   Plantar Fascia Stretch  30 seconds;1 rep   each LE at wall   Other Stretch  R & L KTOS x30"   limited ROM   Other Stretch  supine HS stretch with strap 2x30" each LE      Ankle Exercises: Standing   Heel Raises  Right;Left;5 reps   single leg heel raise- mosre difficult on L   Heel Walk (Round Trip)  2190ft    Toe Walk (Round Trip)  45 ft toe walk; 45 ft heel-toe heel raise walk    Warrior I  shallow split squat at counter top x10 each LE   more flexible on R LE   Side Shuffle (Round Trip)  sidestepping with red TB around toes 4x8220ft   cues to center toes   Other Standing Ankle Exercises  B conc/R & L eccentric heel raise at counter top x10 each    Other Standing Ankle Exercises  B heel raise with toes elevated on beanbag 2x10             PT Education - 10/10/18 0928    Education Details  update to HEP    Person(s)

## 2018-10-15 ENCOUNTER — Encounter: Payer: 59 | Admitting: Physical Therapy

## 2018-10-16 ENCOUNTER — Encounter: Payer: Self-pay | Admitting: Physical Therapy

## 2018-10-16 ENCOUNTER — Ambulatory Visit: Payer: 59 | Admitting: Physical Therapy

## 2018-10-16 ENCOUNTER — Other Ambulatory Visit: Payer: Self-pay

## 2018-10-16 DIAGNOSIS — M79672 Pain in left foot: Secondary | ICD-10-CM | POA: Diagnosis not present

## 2018-10-16 DIAGNOSIS — M25675 Stiffness of left foot, not elsewhere classified: Secondary | ICD-10-CM

## 2018-10-16 DIAGNOSIS — R262 Difficulty in walking, not elsewhere classified: Secondary | ICD-10-CM

## 2018-10-16 DIAGNOSIS — R29898 Other symptoms and signs involving the musculoskeletal system: Secondary | ICD-10-CM

## 2018-10-16 DIAGNOSIS — M79671 Pain in right foot: Secondary | ICD-10-CM

## 2018-10-16 DIAGNOSIS — M25674 Stiffness of right foot, not elsewhere classified: Secondary | ICD-10-CM

## 2018-10-16 NOTE — Therapy (Signed)
walking, Increased muscle spasms, Improper body mechanics, Decreased range of motion, Impaired flexibility, Postural dysfunction  Visit Diagnosis: 1. Pain in left foot   2. Pain in right foot   3. Stiffness of left foot, not elsewhere classified   4. Stiffness of right foot, not elsewhere classified   5. Difficulty in walking, not elsewhere classified   6. Other symptoms and signs involving the musculoskeletal system        Problem List Patient Active Problem List   Diagnosis Date Noted  . Family history of pancreatic cancer 01/15/2018  . Coronary artery disease 08/10/2017  . Left ankle pain 07/20/2017  . Erectile dysfunction 03/21/2017  . Morbid obesity (HCC) 07/20/2015  . Annual physical exam 01/17/2015  . PCP NOTES >>> 01/17/2015  . Family history of prostate cancer 03/25/2012  . GERD 04/01/2010  . Hyperlipidemia 10/07/2007     Anette GuarneriYevgeniya Osiris Odriscoll, PT, DPT 10/16/18 10:33 AM   [facility] [address]  [address] [place], [place], [address] Phone: [phone]   Fax:  [phone]  Name: Ventura SellersJoseph E Wasilewski Jr. MRN: [id number] Date of Birth: 06/19/1960  [place] [place] [address]  [place], [place], [address] Phone: [phone]   Fax:  [phone]  Physical Therapy Treatment  Patient Details  Name: Aysen Shieh. MRN: [id number] Date of Birth: [birth year] Referring Provider (PT): Karlton Lemon, MD   Encounter Date: 10/16/2018  PT End of Session - 10/16/18 1028    Visit Number  8    Number of Visits  17    Date for PT Re-Evaluation  11/13/18    Authorization Type  UHC    PT Start Time  0848    PT Stop Time  0927    PT Time Calculation (min)  39 min    Activity Tolerance  Patient tolerated treatment well    Behavior During Therapy  [facility] for tasks assessed/performed       Past Medical History:  Diagnosis Date  . Arthritis   . Atypical chest pain   . Cholelithiasis 09/2015   determined by CT, asymptomatic  . Diverticulitis   . Family history of heart disease   . GERD (gastroesophageal reflux disease)   . Hyperlipidemia   . Seasonal allergies     Past Surgical History:  Procedure Laterality Date  . CARDIAC CATHETERIZATION  2010   Dr. Gwenlyn Found; less than 30% occlusive disease  . CHOLECYSTECTOMY N/A 06/14/2018   Procedure: LAPAROSCOPIC CHOLECYSTECTOMY;  Surgeon: Ralene Ok, MD;  Location: [place];  Service: General;  Laterality: N/A;  . TONSILLECTOMY     age 58    There were no vitals filed for this visit.  Subjective Assessment - 10/16/18 0850    Subjective  Reports that he mowed a push mower for 2.5 hours over the weekend and did not have any soreness. Still having some stiffness in HS in the AM.    Pertinent History  HLD, GERD, atypical chest pain, arthritis, cardiac cath 2010    Diagnostic tests  none    Patient Stated Goals  "i need to be more flexible"    Currently in Pain?  No/denies                       [facility] Adult PT Treatment/Exercise - 10/16/18 0001      Exercises   Exercises  Knee/Hip      Knee/Hip Exercises: Standing    Other Standing Knee Exercises  deadlift with 5# each hand x10   cues to correct thoracic kyphosis   Other Standing Knee Exercises  hip hinge with dowel behind back x10   cues to bring bottom back and back flat     Ankle Exercises: Aerobic   Nustep  L5 x 6 min (LEs only)      Ankle Exercises: Stretches   Plantar Fascia Stretch  2 reps;30 seconds   R & L LE with toes up on cabinet   Other Stretch  supine HS stretch with strap 2x30" each LE      Ankle Exercises: Standing   SLS  R & L ring toss game each side with intermittent 1 finger support    Heel Raises  Right;Left;15 reps    Other Standing Ankle Exercises  2x15; single leg heel raise at counter top    Other Standing Ankle Exercises  sidestepping with red TB around toes 2x73ft      Ankle Exercises: Seated   Other Seated Ankle Exercises  R & L ankle inversion with green TB x15 each    manual cues to avoid  [facility] [place] [address]  [place], [place], [address] Phone: [phone]   Fax:  [phone]  Physical Therapy Treatment  Patient Details  Name: Aysen Shieh. MRN: [id number] Date of Birth: [birth year] Referring Provider (PT): Karlton Lemon, MD   Encounter Date: 10/16/2018  PT End of Session - 10/16/18 1028    Visit Number  8    Number of Visits  17    Date for PT Re-Evaluation  11/13/18    Authorization Type  UHC    PT Start Time  0848    PT Stop Time  0927    PT Time Calculation (min)  39 min    Activity Tolerance  Patient tolerated treatment well    Behavior During Therapy  [facility] for tasks assessed/performed       Past Medical History:  Diagnosis Date  . Arthritis   . Atypical chest pain   . Cholelithiasis 09/2015   determined by CT, asymptomatic  . Diverticulitis   . Family history of heart disease   . GERD (gastroesophageal reflux disease)   . Hyperlipidemia   . Seasonal allergies     Past Surgical History:  Procedure Laterality Date  . CARDIAC CATHETERIZATION  2010   Dr. Gwenlyn Found; less than 30% occlusive disease  . CHOLECYSTECTOMY N/A 06/14/2018   Procedure: LAPAROSCOPIC CHOLECYSTECTOMY;  Surgeon: Ralene Ok, MD;  Location: [place];  Service: General;  Laterality: N/A;  . TONSILLECTOMY     age 58    There were no vitals filed for this visit.  Subjective Assessment - 10/16/18 0850    Subjective  Reports that he mowed a push mower for 2.5 hours over the weekend and did not have any soreness. Still having some stiffness in HS in the AM.    Pertinent History  HLD, GERD, atypical chest pain, arthritis, cardiac cath 2010    Diagnostic tests  none    Patient Stated Goals  "i need to be more flexible"    Currently in Pain?  No/denies                       [facility] Adult PT Treatment/Exercise - 10/16/18 0001      Exercises   Exercises  Knee/Hip      Knee/Hip Exercises: Standing    Other Standing Knee Exercises  deadlift with 5# each hand x10   cues to correct thoracic kyphosis   Other Standing Knee Exercises  hip hinge with dowel behind back x10   cues to bring bottom back and back flat     Ankle Exercises: Aerobic   Nustep  L5 x 6 min (LEs only)      Ankle Exercises: Stretches   Plantar Fascia Stretch  2 reps;30 seconds   R & L LE with toes up on cabinet   Other Stretch  supine HS stretch with strap 2x30" each LE      Ankle Exercises: Standing   SLS  R & L ring toss game each side with intermittent 1 finger support    Heel Raises  Right;Left;15 reps    Other Standing Ankle Exercises  2x15; single leg heel raise at counter top    Other Standing Ankle Exercises  sidestepping with red TB around toes 2x73ft      Ankle Exercises: Seated   Other Seated Ankle Exercises  R & L ankle inversion with green TB x15 each    manual cues to avoid

## 2018-10-17 ENCOUNTER — Ambulatory Visit: Payer: 59 | Admitting: Physical Therapy

## 2018-10-17 ENCOUNTER — Encounter: Payer: Self-pay | Admitting: Physical Therapy

## 2018-10-17 ENCOUNTER — Other Ambulatory Visit: Payer: Self-pay

## 2018-10-17 DIAGNOSIS — M25675 Stiffness of left foot, not elsewhere classified: Secondary | ICD-10-CM

## 2018-10-17 DIAGNOSIS — M25674 Stiffness of right foot, not elsewhere classified: Secondary | ICD-10-CM

## 2018-10-17 DIAGNOSIS — R262 Difficulty in walking, not elsewhere classified: Secondary | ICD-10-CM

## 2018-10-17 DIAGNOSIS — M79672 Pain in left foot: Secondary | ICD-10-CM | POA: Diagnosis not present

## 2018-10-17 DIAGNOSIS — R29898 Other symptoms and signs involving the musculoskeletal system: Secondary | ICD-10-CM

## 2018-10-17 DIAGNOSIS — M79671 Pain in right foot: Secondary | ICD-10-CM

## 2018-10-17 NOTE — Therapy (Signed)
Coronary artery disease 08/10/2017  . Left ankle pain 07/20/2017  . Erectile dysfunction 03/21/2017  . Morbid obesity (HCC) 07/20/2015  . Annual physical exam 01/17/2015  . PCP NOTES >>> 01/17/2015  . Family history of prostate cancer 03/25/2012  . GERD 04/01/2010  . Hyperlipidemia 10/07/2007     Anette GuarneriYevgeniya Kovalenko, PT, DPT 10/17/18 10:45 AM   [facility] [address]  [address] [place], [place], [address] Phone: [phone]   Fax:  [phone]  Name: Dennis SellersJoseph E Dicioccio Jr. MRN: [id number] Date of Birth: 10/07/1960  [facility] [address]  [address] [place], [place], [address] Phone: [phone]   Fax:  [phone]  [facility]  Patient Details  Name: Dennis SellersJoseph E Clyne Jr. MRN: [id number] Date of Birth: 05/13/1960 Referring Provider (PT): Norton BlizzardShane Hudnall, MD   Encounter Date: 10/17/2018  PT End of Session - 10/17/18 0956    Visit Number  9    Number of Visits  17    Date for PT Re-Evaluation  11/13/18    Authorization Type  UHC    PT Start Time  0848    PT Stop Time  0926    PT Time Calculation (min)  38 min    Activity Tolerance  Patient tolerated treatment well    Behavior During Therapy  [facility] for tasks assessed/performed       Past Medical History:  Diagnosis Date  . Arthritis   . Atypical chest pain   . Cholelithiasis 09/2015   determined by CT, asymptomatic  . Diverticulitis   . Family history of heart disease   . GERD (gastroesophageal reflux disease)   . Hyperlipidemia   . Seasonal allergies     Past Surgical History:  Procedure Laterality Date  . CARDIAC CATHETERIZATION  2010   Dr. Allyson SabalBerry; less than 30% occlusive disease  . CHOLECYSTECTOMY N/A 06/14/2018   Procedure: LAPAROSCOPIC CHOLECYSTECTOMY;  Surgeon: Axel Filleramirez, Armando, MD;  [facility];  Service: General;  Laterality: N/A;  . TONSILLECTOMY     age 58    There were no vitals filed for this visit.  Subjective Assessment - 10/17/18 0851    Subjective  Reports that he is feeling good. No soreness after last session.    Pertinent History  HLD, GERD, atypical chest pain, arthritis, cardiac cath 2010    Diagnostic tests  none    Patient Stated Goals  "i need to be more flexible"    Currently in Pain?  No/denies                       [facility] - 10/17/18 0001      Self-Care   Self-Care  Other Self-Care Comments    Other Self-Care Comments   edu and practice of self-STM using roller stick, tennis ball, foam  roller to B calf      Manual Therapy   Manual Therapy  Soft tissue mobilization;Passive ROM;Myofascial release    Manual therapy comments  prone    Soft tissue mobilization  STM to B medial and lateral gastroc- most tenderness and soft tissue restriction in lateral gastroc with twitch response    Myofascial Release  manual TPR to R & L lateral gastroc    Passive ROM  B ankle inversion stretch 2x30" to tolerance      Ankle Exercises: Aerobic   Nustep  L5 x 6 min (LEs only)      Ankle Exercises: Standing   Vector Stance  Right;Left;5 reps   SLS to cones' more difficulty with L   Toe Walk (Round Trip)  90 ft toe walk; 90 ft heel-toe heel raise walk             PT Education - 10/17/18 0955    Education Details  update to HEP    Person(s) Educated  Patient    Methods  Explanation;Demonstration;Tactile cues;Verbal cues;Handout    Comprehension  Verbalized understanding;Returned demonstration       PT Short Term Goals - 10/03/18  [facility] [place] [address]  [address] [place], [place], [address] Phone: [phone]   Fax:  [phone]  [facility]  Patient Details  Name: Dennis SellersJoseph E Clyne Jr. MRN: [id number] Date of Birth: 05/13/1960 Referring Provider (PT): Norton BlizzardShane Hudnall, MD   Encounter Date: 10/17/2018  PT End of Session - 10/17/18 0956    Visit Number  9    Number of Visits  17    Date for PT Re-Evaluation  11/13/18    Authorization Type  UHC    PT Start Time  0848    PT Stop Time  0926    PT Time Calculation (min)  38 min    Activity Tolerance  Patient tolerated treatment well    Behavior During Therapy  [facility] for tasks assessed/performed       Past Medical History:  Diagnosis Date  . Arthritis   . Atypical chest pain   . Cholelithiasis 09/2015   determined by CT, asymptomatic  . Diverticulitis   . Family history of heart disease   . GERD (gastroesophageal reflux disease)   . Hyperlipidemia   . Seasonal allergies     Past Surgical History:  Procedure Laterality Date  . CARDIAC CATHETERIZATION  2010   Dr. Allyson SabalBerry; less than 30% occlusive disease  . CHOLECYSTECTOMY N/A 06/14/2018   Procedure: LAPAROSCOPIC CHOLECYSTECTOMY;  Surgeon: Axel Filleramirez, Armando, MD;  [facility];  Service: General;  Laterality: N/A;  . TONSILLECTOMY     age 58    There were no vitals filed for this visit.  Subjective Assessment - 10/17/18 0851    Subjective  Reports that he is feeling good. No soreness after last session.    Pertinent History  HLD, GERD, atypical chest pain, arthritis, cardiac cath 2010    Diagnostic tests  none    Patient Stated Goals  "i need to be more flexible"    Currently in Pain?  No/denies                       [facility]/Exercise - 10/17/18 0001      Self-Care   Self-Care  Other Self-Care Comments    Other Self-Care Comments   edu and practice of self-STM using roller stick, tennis ball, foam  roller to B calf      Manual Therapy   Manual Therapy  Soft tissue mobilization;Passive ROM;Myofascial release    Manual therapy comments  prone    Soft tissue mobilization  STM to B medial and lateral gastroc- most tenderness and soft tissue restriction in lateral gastroc with twitch response    Myofascial Release  manual TPR to R & L lateral gastroc    Passive ROM  B ankle inversion stretch 2x30" to tolerance      Ankle Exercises: Aerobic   Nustep  L5 x 6 min (LEs only)      Ankle Exercises: Standing   Vector Stance  Right;Left;5 reps   SLS to cones' more difficulty with L   Toe Walk (Round Trip)  90 ft toe walk; 90 ft heel-toe heel raise walk             PT Education - 10/17/18 0955    Education Details  update to HEP    Person(s) Educated  Patient    Methods  Explanation;Demonstration;Tactile cues;Verbal cues;Handout    Comprehension  Verbalized understanding;Returned demonstration       PT Short Term Goals - 10/03/18

## 2018-10-22 ENCOUNTER — Encounter: Payer: Self-pay | Admitting: Physical Therapy

## 2018-10-22 ENCOUNTER — Ambulatory Visit: Payer: 59 | Admitting: Physical Therapy

## 2018-10-22 ENCOUNTER — Other Ambulatory Visit: Payer: Self-pay

## 2018-10-22 DIAGNOSIS — M79672 Pain in left foot: Secondary | ICD-10-CM | POA: Diagnosis not present

## 2018-10-22 DIAGNOSIS — M79671 Pain in right foot: Secondary | ICD-10-CM

## 2018-10-22 DIAGNOSIS — M25675 Stiffness of left foot, not elsewhere classified: Secondary | ICD-10-CM

## 2018-10-22 DIAGNOSIS — R29898 Other symptoms and signs involving the musculoskeletal system: Secondary | ICD-10-CM

## 2018-10-22 DIAGNOSIS — M25674 Stiffness of right foot, not elsewhere classified: Secondary | ICD-10-CM

## 2018-10-22 DIAGNOSIS — R262 Difficulty in walking, not elsewhere classified: Secondary | ICD-10-CM

## 2018-10-22 NOTE — Therapy (Signed)
[facility] [place] [address]  [place], [place], [address] Phone: [phone]   Fax:  [phone]  Physical Therapy Progress Note  Patient Details  Name: Dennis Castro. MRN: [id number] Date of Birth: 08/29/1960 Referring Provider (PT): Karlton Lemon, MD   Encounter Date: 10/22/2018  PT End of Session - 10/22/18 1801    Visit Number  10    Number of Visits  17    Date for PT Re-Evaluation  11/13/18    Authorization Type  UHC    PT Start Time  3810    PT Stop Time  1657    PT Time Calculation (min)  42 min    Activity Tolerance  Patient tolerated treatment well    Behavior During Therapy  [facility] for tasks assessed/performed       Past Medical History:  Diagnosis Date  . Arthritis   . Atypical chest pain   . Cholelithiasis 09/2015   determined by CT, asymptomatic  . Diverticulitis   . Family history of heart disease   . GERD (gastroesophageal reflux disease)   . Hyperlipidemia   . Seasonal allergies     Past Surgical History:  Procedure Laterality Date  . CARDIAC CATHETERIZATION  2010   Dr. Gwenlyn Found; less than 30% occlusive disease  . CHOLECYSTECTOMY N/A 06/14/2018   Procedure: LAPAROSCOPIC CHOLECYSTECTOMY;  Surgeon: Ralene Ok, MD;  Location: [facility];  Service: General;  Laterality: N/A;  . TONSILLECTOMY     age 58    There were no vitals filed for this visit.  Subjective Assessment - 10/22/18 1616    Subjective  Reports that he is doing well. Reports 60-70% improvement in pain levels. Still having some remaining stiffness in HS and feet. Feels like his stiffness has moved up to the hips as he is not moving as much as he is working from home.    Pertinent History  HLD, GERD, atypical chest pain, arthritis, cardiac cath 2010    Diagnostic tests  none    Patient Stated Goals  "i need to be more flexible"    Currently in Pain?  No/denies         [facility] PT Assessment - 10/22/18 0001       Observation/Other Assessments   Focus on Therapeutic Outcomes (FOTO)   Foot: 72 (28% limited, 25% predicted)      AROM   Right Ankle Dorsiflexion  10    Right Ankle Plantar Flexion  65    Right Ankle Inversion  20    Right Ankle Eversion  30    Left Ankle Dorsiflexion  14    Left Ankle Plantar Flexion  20    Left Ankle Inversion  10    Left Ankle Eversion  20      Strength   Right Ankle Dorsiflexion  5/5    Right Ankle Plantar Flexion  4+/5   20 reps but with limited ROM   Right Ankle Inversion  4+/5    Right Ankle Eversion  5/5    Left Ankle Dorsiflexion  5/5    Left Ankle Plantar Flexion  4+/5   20 reps with very limited ROM   Left Ankle Inversion  4/5   mild medial foot pain   Left Ankle Eversion  5/5                   [facility] Adult PT Treatment/Exercise - 10/22/18 0001      Knee/Hip Exercises:  [facility] [place] [address]  [place], [place], [address] Phone: [phone]   Fax:  [phone]  Physical Therapy Progress Note  Patient Details  Name: Dennis Castro. MRN: [id number] Date of Birth: 08/29/1960 Referring Provider (PT): Karlton Lemon, MD   Encounter Date: 10/22/2018  PT End of Session - 10/22/18 1801    Visit Number  10    Number of Visits  17    Date for PT Re-Evaluation  11/13/18    Authorization Type  UHC    PT Start Time  3810    PT Stop Time  1657    PT Time Calculation (min)  42 min    Activity Tolerance  Patient tolerated treatment well    Behavior During Therapy  [facility] for tasks assessed/performed       Past Medical History:  Diagnosis Date  . Arthritis   . Atypical chest pain   . Cholelithiasis 09/2015   determined by CT, asymptomatic  . Diverticulitis   . Family history of heart disease   . GERD (gastroesophageal reflux disease)   . Hyperlipidemia   . Seasonal allergies     Past Surgical History:  Procedure Laterality Date  . CARDIAC CATHETERIZATION  2010   Dr. Gwenlyn Found; less than 30% occlusive disease  . CHOLECYSTECTOMY N/A 06/14/2018   Procedure: LAPAROSCOPIC CHOLECYSTECTOMY;  Surgeon: Ralene Ok, MD;  Location: [facility];  Service: General;  Laterality: N/A;  . TONSILLECTOMY     age 58    There were no vitals filed for this visit.  Subjective Assessment - 10/22/18 1616    Subjective  Reports that he is doing well. Reports 60-70% improvement in pain levels. Still having some remaining stiffness in HS and feet. Feels like his stiffness has moved up to the hips as he is not moving as much as he is working from home.    Pertinent History  HLD, GERD, atypical chest pain, arthritis, cardiac cath 2010    Diagnostic tests  none    Patient Stated Goals  "i need to be more flexible"    Currently in Pain?  No/denies         [facility] PT Assessment - 10/22/18 0001       Observation/Other Assessments   Focus on Therapeutic Outcomes (FOTO)   Foot: 72 (28% limited, 25% predicted)      AROM   Right Ankle Dorsiflexion  10    Right Ankle Plantar Flexion  65    Right Ankle Inversion  20    Right Ankle Eversion  30    Left Ankle Dorsiflexion  14    Left Ankle Plantar Flexion  20    Left Ankle Inversion  10    Left Ankle Eversion  20      Strength   Right Ankle Dorsiflexion  5/5    Right Ankle Plantar Flexion  4+/5   20 reps but with limited ROM   Right Ankle Inversion  4+/5    Right Ankle Eversion  5/5    Left Ankle Dorsiflexion  5/5    Left Ankle Plantar Flexion  4+/5   20 reps with very limited ROM   Left Ankle Inversion  4/5   mild medial foot pain   Left Ankle Eversion  5/5                   [facility] Adult PT Treatment/Exercise - 10/22/18 0001      Knee/Hip Exercises:  [facility] [address]  [place], [place], [address] Phone: [phone]   Fax:  [phone]  Physical Therapy Progress Note  Patient Details  Name: Dennis Castro. MRN: [id number] Date of Birth: 08/29/1960 Referring Provider (PT): Karlton Lemon, MD   Encounter Date: 10/22/2018  PT End of Session - 10/22/18 1801    Visit Number  10    Number of Visits  17    Date for PT Re-Evaluation  11/13/18    Authorization Type  UHC    PT Start Time  3810    PT Stop Time  1657    PT Time Calculation (min)  42 min    Activity Tolerance  Patient tolerated treatment well    Behavior During Therapy  [facility] for tasks assessed/performed       Past Medical History:  Diagnosis Date  . Arthritis   . Atypical chest pain   . Cholelithiasis 09/2015   determined by CT, asymptomatic  . Diverticulitis   . Family history of heart disease   . GERD (gastroesophageal reflux disease)   . Hyperlipidemia   . Seasonal allergies     Past Surgical History:  Procedure Laterality Date  . CARDIAC CATHETERIZATION  2010   Dr. Gwenlyn Found; less than 30% occlusive disease  . CHOLECYSTECTOMY N/A 06/14/2018   Procedure: LAPAROSCOPIC CHOLECYSTECTOMY;  Surgeon: Ralene Ok, MD;  Location: [facility];  Service: General;  Laterality: N/A;  . TONSILLECTOMY     age 58    There were no vitals filed for this visit.  Subjective Assessment - 10/22/18 1616    Subjective  Reports that he is doing well. Reports 60-70% improvement in pain levels. Still having some remaining stiffness in HS and feet. Feels like his stiffness has moved up to the hips as he is not moving as much as he is working from home.    Pertinent History  HLD, GERD, atypical chest pain, arthritis, cardiac cath 2010    Diagnostic tests  none    Patient Stated Goals  "i need to be more flexible"    Currently in Pain?  No/denies         [facility] PT Assessment - 10/22/18 0001       Observation/Other Assessments   Focus on Therapeutic Outcomes (FOTO)   Foot: 72 (28% limited, 25% predicted)      AROM   Right Ankle Dorsiflexion  10    Right Ankle Plantar Flexion  65    Right Ankle Inversion  20    Right Ankle Eversion  30    Left Ankle Dorsiflexion  14    Left Ankle Plantar Flexion  20    Left Ankle Inversion  10    Left Ankle Eversion  20      Strength   Right Ankle Dorsiflexion  5/5    Right Ankle Plantar Flexion  4+/5   20 reps but with limited ROM   Right Ankle Inversion  4+/5    Right Ankle Eversion  5/5    Left Ankle Dorsiflexion  5/5    Left Ankle Plantar Flexion  4+/5   20 reps with very limited ROM   Left Ankle Inversion  4/5   mild medial foot pain   Left Ankle Eversion  5/5                   [facility] Adult PT Treatment/Exercise - 10/22/18 0001      Knee/Hip Exercises:

## 2018-10-23 ENCOUNTER — Other Ambulatory Visit: Payer: Self-pay

## 2018-10-23 ENCOUNTER — Encounter: Payer: Self-pay | Admitting: Family Medicine

## 2018-10-23 ENCOUNTER — Ambulatory Visit: Payer: 59 | Admitting: Family Medicine

## 2018-10-23 DIAGNOSIS — M25572 Pain in left ankle and joints of left foot: Secondary | ICD-10-CM | POA: Diagnosis not present

## 2018-10-23 DIAGNOSIS — G8929 Other chronic pain: Secondary | ICD-10-CM | POA: Diagnosis not present

## 2018-10-23 NOTE — Assessment & Plan Note (Signed)
Pain has improved significantly. Still has instability with left compared to right  - continue PT  - counseled on compression  - consider standing desk  - if no improvement consider xray and [place]. Nitro or inj

## 2018-10-23 NOTE — Progress Notes (Signed)
Dennis Castro. - 58 y.o. male MRN [id number]  Date of birth: Oct 02, 1960  SUBJECTIVE:  Including CC & ROS.  No chief complaint on file.   Dennis Castro. is a 58 y.o. male that is following up for left ankle pain and plantar fasciitis.  He has been undergoing physical therapy and feels significant improvement.  He reports no pain in clinic today.  His pain is only occurring when he is testing stability of his left ankle.  He does not have any pain when he is not testing this.  He is working from home currently.    Review of Systems  Constitutional: Negative for fever.  HENT: Negative for congestion.   Respiratory: Negative for cough.   Cardiovascular: Negative for chest pain.  Gastrointestinal: Negative for abdominal pain.  Musculoskeletal: Negative for joint swelling.  Skin: Negative for color change.  Neurological: Negative for weakness.  Hematological: Negative for adenopathy.    HISTORY: Past Medical, Surgical, Social, and Family History Reviewed & Updated per EMR.   Pertinent Historical Findings include:  Past Medical History:  Diagnosis Date  . Arthritis   . Atypical chest pain   . Cholelithiasis 09/2015   determined by CT, asymptomatic  . Diverticulitis   . Family history of heart disease   . GERD (gastroesophageal reflux disease)   . Hyperlipidemia   . Seasonal allergies     Past Surgical History:  Procedure Laterality Date  . CARDIAC CATHETERIZATION  2010   Dr. Allyson Sabal; less than 30% occlusive disease  . CHOLECYSTECTOMY N/A 06/14/2018   Procedure: LAPAROSCOPIC CHOLECYSTECTOMY;  Surgeon: Axel Filler, MD;  [facility];  Service: General;  Laterality: N/A;  . TONSILLECTOMY     age 89    No Known Allergies  Family History  Problem Relation Age of Onset  . Diabetes Mother   . CAD Mother 41       CBAG  . Cancer Father        prostate cancer and panceatic cancer  . Breast cancer Maternal Grandmother   . Pancreatic cancer Paternal Grandfather   .  Stroke Neg Hx   . Colon cancer Neg Hx      Social History   Socioeconomic History  . Marital status: Married    Spouse name: Not on file  . Number of children: 1  . Years of education: Not on file  . Highest education level: Not on file  Occupational History  . Occupation: Production designer, theatre/television/film, office job  Social Needs  . Financial resource strain: Not on file  . Food insecurity    Worry: Not on file    Inability: Not on file  . Transportation needs    Medical: Not on file    Non-medical: Not on file  Tobacco Use  . Smoking status: Never Smoker  . Smokeless tobacco: Never Used  Substance and Sexual Activity  . Alcohol use: No  . Drug use: No  . Sexual activity: Yes  Lifestyle  . Physical activity    Days per week: Not on file    Minutes per session: Not on file  . Stress: Not on file  Relationships  . Social Musician on phone: Not on file    Gets together: Not on file    Attends religious service: Not on file    Active member of club or organization: Not on file    Attends meetings of clubs or organizations: Not on file    Relationship status:  Not on file  . Intimate partner violence    Fear of current or ex partner: Not on file    Emotionally abused: Not on file    Physically abused: Not on file    Forced sexual activity: Not on file  Other Topics Concern  . 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 Physical Exam Gen: NAD, alert, cooperative with exam, well-appearing ENT: normal lips, normal nasal mucosa,  Eye: normal EOM, normal conjunctiva and lids CV:  no edema, +2 pedal pulses   Resp: no accessory muscle use, non-labored,  Skin: no rashes, no areas of induration  Neuro: normal tone, normal sensation to touch Psych:  normal insight, alert and oriented MSK:  Left ankle/foot:  No specific TTP  Normal ROM  Normal strength to resistance  Instability with one leg testing  on left compared to right  NVI      ASSESSMENT & PLAN:   Left ankle pain Pain has improved significantly. Still has instability with left compared to right  - continue PT  - counseled on compression  - consider standing desk  - if no improvement consider xray and [place]. Nitro or inj

## 2018-10-23 NOTE — Patient Instructions (Signed)
Nice to meet you Good job with PT and keep it up.  Please try the body helix  Please let me know if you would like to try the standing desk  Please send me a message in [place] with any questions or updates.  Please see me back in 2 months or as needed.   --Dr. Raeford Razor

## 2018-10-24 ENCOUNTER — Ambulatory Visit: Payer: 59

## 2018-10-24 DIAGNOSIS — M25675 Stiffness of left foot, not elsewhere classified: Secondary | ICD-10-CM

## 2018-10-24 DIAGNOSIS — M79672 Pain in left foot: Secondary | ICD-10-CM | POA: Diagnosis not present

## 2018-10-24 DIAGNOSIS — R29898 Other symptoms and signs involving the musculoskeletal system: Secondary | ICD-10-CM

## 2018-10-24 DIAGNOSIS — R262 Difficulty in walking, not elsewhere classified: Secondary | ICD-10-CM

## 2018-10-24 DIAGNOSIS — M25674 Stiffness of right foot, not elsewhere classified: Secondary | ICD-10-CM

## 2018-10-24 DIAGNOSIS — M79671 Pain in right foot: Secondary | ICD-10-CM

## 2018-10-24 NOTE — Therapy (Signed)
[facility] [address]  [place], [place], [address] Phone: [phone]   Fax:  [phone]  Physical Therapy Treatment  Patient Details  Name: Dennis Castro. MRN: [id number] Date of Birth: [birth year] Referring Provider (PT): Karlton Lemon, MD   Encounter Date: 10/24/2018  PT End of Session - 10/24/18 0856    Visit Number  11    Number of Visits  17    Date for PT Re-Evaluation  11/13/18    Authorization Type  UHC    PT Start Time  0849    PT Stop Time  0928    PT Time Calculation (min)  39 min    Activity Tolerance  Patient tolerated treatment well    Behavior During Therapy  [facility] for tasks assessed/performed       Past Medical History:  Diagnosis Date  . Arthritis   . Atypical chest pain   . Cholelithiasis 09/2015   determined by CT, asymptomatic  . Diverticulitis   . Family history of heart disease   . GERD (gastroesophageal reflux disease)   . Hyperlipidemia   . Seasonal allergies     Past Surgical History:  Procedure Laterality Date  . CARDIAC CATHETERIZATION  2010   Dr. Gwenlyn Found; less than 30% occlusive disease  . CHOLECYSTECTOMY N/A 06/14/2018   Procedure: LAPAROSCOPIC CHOLECYSTECTOMY;  Surgeon: Ralene Ok, MD;  Location: [facility];  Service: General;  Laterality: N/A;  . TONSILLECTOMY     age 58    There were no vitals filed for this visit.  Subjective Assessment - 10/24/18 0855    Subjective  Pt. doing well today.    Pertinent History  HLD, GERD, atypical chest pain, arthritis, cardiac cath 2010    Patient Stated Goals  "i need to be more flexible"    Currently in Pain?  No/denies    Pain Score  0-No pain    Multiple Pain Sites  No                       [facility] Adult PT Treatment/Exercise - 10/24/18 0001      Knee/Hip Exercises: Stretches   Passive Hamstring Stretch  Right;2 reps;30 seconds    Passive Hamstring Stretch Limitations  supine; strap     ITB Stretch   Right;2 reps;30 seconds    ITB Stretch Limitations  supine with strap     Piriformis Stretch  Right;Left;2 reps;30 seconds    Piriformis Stretch Limitations  supine figure-4 and KTOS    Other Knee/Hip Stretches  seated R Figure-4, KTOS stretch x 30 sec       Ankle Exercises: Machines for Strengthening   Cybex Leg Press  bent and straight knee calf raise 35# x 20 reps each way       Ankle Exercises: Standing   Vector Stance  --   2 x 30 sec R SLS with L LE cone nock over/righting    Vector Stance Limitations  L LE cone nock over/righting    Heel Raises  20 reps;3 seconds   ball squeeze between medial ankles      Ankle Exercises: Aerobic   Recumbent Bike  Lvl 2, 6 min              PT Education - 10/24/18 0956    Education Details  HEP update    Person(s) Educated  Patient    Methods  Explanation;Demonstration;Verbal cues;Handout    Comprehension  Verbalized  Pain in right foot   3. Stiffness of left foot, not elsewhere classified   4. Stiffness of right foot, not elsewhere classified   5. Difficulty in walking, not elsewhere classified   6. Other symptoms and signs involving the musculoskeletal system        Problem List Patient Active Problem List   Diagnosis Date Noted  . Family history of pancreatic cancer 01/15/2018  . Coronary artery disease 08/10/2017  . Left ankle pain 07/20/2017  . Erectile dysfunction 03/21/2017  . Morbid obesity ([place]) 07/20/2015  . Annual physical exam 01/17/2015  . PCP NOTES >>> 01/17/2015  . Family history of prostate cancer 03/25/2012  . GERD 04/01/2010  . Hyperlipidemia 10/07/2007    Bess Harvest, PTA 10/24/18 11:39 AM    [facility] [address]  [place], [place], [address] Phone: [phone]   Fax:  [phone]  Name: Lucion Dilger. MRN: [id number] Date of Birth: [birth year]  [facility] [address]  [place], [place], [address] Phone: [phone]   Fax:  [phone]  Physical Therapy Treatment  Patient Details  Name: Dennis Castro. MRN: [id number] Date of Birth: [birth year] Referring Provider (PT): Karlton Lemon, MD   Encounter Date: 10/24/2018  PT End of Session - 10/24/18 0856    Visit Number  11    Number of Visits  17    Date for PT Re-Evaluation  11/13/18    Authorization Type  UHC    PT Start Time  0849    PT Stop Time  0928    PT Time Calculation (min)  39 min    Activity Tolerance  Patient tolerated treatment well    Behavior During Therapy  [facility] for tasks assessed/performed       Past Medical History:  Diagnosis Date  . Arthritis   . Atypical chest pain   . Cholelithiasis 09/2015   determined by CT, asymptomatic  . Diverticulitis   . Family history of heart disease   . GERD (gastroesophageal reflux disease)   . Hyperlipidemia   . Seasonal allergies     Past Surgical History:  Procedure Laterality Date  . CARDIAC CATHETERIZATION  2010   Dr. Gwenlyn Found; less than 30% occlusive disease  . CHOLECYSTECTOMY N/A 06/14/2018   Procedure: LAPAROSCOPIC CHOLECYSTECTOMY;  Surgeon: Ralene Ok, MD;  Location: [facility];  Service: General;  Laterality: N/A;  . TONSILLECTOMY     age 58    There were no vitals filed for this visit.  Subjective Assessment - 10/24/18 0855    Subjective  Pt. doing well today.    Pertinent History  HLD, GERD, atypical chest pain, arthritis, cardiac cath 2010    Patient Stated Goals  "i need to be more flexible"    Currently in Pain?  No/denies    Pain Score  0-No pain    Multiple Pain Sites  No                       [facility] Adult PT Treatment/Exercise - 10/24/18 0001      Knee/Hip Exercises: Stretches   Passive Hamstring Stretch  Right;2 reps;30 seconds    Passive Hamstring Stretch Limitations  supine; strap     ITB Stretch   Right;2 reps;30 seconds    ITB Stretch Limitations  supine with strap     Piriformis Stretch  Right;Left;2 reps;30 seconds    Piriformis Stretch Limitations  supine figure-4 and KTOS    Other Knee/Hip Stretches  seated R Figure-4, KTOS stretch x 30 sec       Ankle Exercises: Machines for Strengthening   Cybex Leg Press  bent and straight knee calf raise 35# x 20 reps each way       Ankle Exercises: Standing   Vector Stance  --   2 x 30 sec R SLS with L LE cone nock over/righting    Vector Stance Limitations  L LE cone nock over/righting    Heel Raises  20 reps;3 seconds   ball squeeze between medial ankles      Ankle Exercises: Aerobic   Recumbent Bike  Lvl 2, 6 min              PT Education - 10/24/18 0956    Education Details  HEP update    Person(s) Educated  Patient    Methods  Explanation;Demonstration;Verbal cues;Handout    Comprehension  Verbalized

## 2018-11-06 ENCOUNTER — Other Ambulatory Visit: Payer: Self-pay

## 2018-11-06 ENCOUNTER — Ambulatory Visit: Payer: 59 | Attending: Family Medicine | Admitting: Physical Therapy

## 2018-11-06 ENCOUNTER — Encounter: Payer: Self-pay | Admitting: Physical Therapy

## 2018-11-06 DIAGNOSIS — M79671 Pain in right foot: Secondary | ICD-10-CM | POA: Diagnosis present

## 2018-11-06 DIAGNOSIS — R262 Difficulty in walking, not elsewhere classified: Secondary | ICD-10-CM | POA: Insufficient documentation

## 2018-11-06 DIAGNOSIS — M25675 Stiffness of left foot, not elsewhere classified: Secondary | ICD-10-CM | POA: Insufficient documentation

## 2018-11-06 DIAGNOSIS — M25674 Stiffness of right foot, not elsewhere classified: Secondary | ICD-10-CM | POA: Diagnosis present

## 2018-11-06 DIAGNOSIS — R29898 Other symptoms and signs involving the musculoskeletal system: Secondary | ICD-10-CM | POA: Insufficient documentation

## 2018-11-06 DIAGNOSIS — M79672 Pain in left foot: Secondary | ICD-10-CM

## 2018-11-06 NOTE — Therapy (Signed)
[facility] [address]  [place], [place], [address] Phone: [phone]   Fax:  [phone]  Physical Therapy Treatment  Patient Details  Name: Dennis Castro. MRN: [id number] Date of Birth: 1961-01-30 Referring Provider (PT): Karlton Lemon, MD   Encounter Date: 11/06/2018  PT End of Session - 11/06/18 0934    Visit Number  12    Number of Visits  17    Date for PT Re-Evaluation  11/13/18    Authorization Type  UHC    PT Start Time  0847    PT Stop Time  0930    PT Time Calculation (min)  43 min    Activity Tolerance  Patient tolerated treatment well    Behavior During Therapy  [facility] for tasks assessed/performed       Past Medical History:  Diagnosis Date  . Arthritis   . Atypical chest pain   . Cholelithiasis 09/2015   determined by CT, asymptomatic  . Diverticulitis   . Family history of heart disease   . GERD (gastroesophageal reflux disease)   . Hyperlipidemia   . Seasonal allergies     Past Surgical History:  Procedure Laterality Date  . CARDIAC CATHETERIZATION  2010   Dr. Gwenlyn Found; less than 30% occlusive disease  . CHOLECYSTECTOMY N/A 06/14/2018   Procedure: LAPAROSCOPIC CHOLECYSTECTOMY;  Surgeon: Ralene Ok, MD;  Location: [facility];  Service: General;  Laterality: N/A;  . TONSILLECTOMY     age 58    There were no vitals filed for this visit.  Subjective Assessment - 11/06/18 0849    Subjective  Planning to get an ankle brace for stability. [place] issues.    Pertinent History  HLD, GERD, atypical chest pain, arthritis, cardiac cath 2010    Diagnostic tests  none    Patient Stated Goals  "i need to be more flexible"    Currently in Pain?  No/denies                       [facility] Adult PT Treatment/Exercise - 11/06/18 0001      Knee/Hip Exercises: Aerobic   Nustep  L6 x 6 min LEs only      Ankle Exercises: Standing   SLS  single leg RDL  touching 2 stacked bolsters x10 each LE   extra pillow for height on L LE   Heel Raises  Right;Left;20 reps   single leg heel raise at counter top   Toe Walk (Round Trip)  3 min heel to toe walk     Other Standing Ankle Exercises  anterior/posterior and M/L wt shifts on upside down bosu with 2 ski poles 6 min    Other Standing Ankle Exercises  R & L hip abduction and adduction with green TB around toes x10 each LE   cues to maintain ankle in neutral     Ankle Exercises: Stretches   Gastroc Stretch  30 seconds;2 reps    Gastroc Stretch Limitations  at wall    [employer] Stretch Limitations  --             PT Education - 11/06/18 0934    Education Details  update to HEP    Person(s) Educated  Patient    Methods  Explanation;Demonstration;Tactile cues;Verbal cues;Handout    Comprehension  Verbalized understanding;Returned demonstration       PT Short Term Goals - 10/22/18  [facility] [address]  [place], [place], [address] Phone: [phone]   Fax:  [phone]  Physical Therapy Treatment  Patient Details  Name: Dennis Castro. MRN: [id number] Date of Birth: 1961-01-30 Referring Provider (PT): Karlton Lemon, MD   Encounter Date: 11/06/2018  PT End of Session - 11/06/18 0934    Visit Number  12    Number of Visits  17    Date for PT Re-Evaluation  11/13/18    Authorization Type  UHC    PT Start Time  0847    PT Stop Time  0930    PT Time Calculation (min)  43 min    Activity Tolerance  Patient tolerated treatment well    Behavior During Therapy  [facility] for tasks assessed/performed       Past Medical History:  Diagnosis Date  . Arthritis   . Atypical chest pain   . Cholelithiasis 09/2015   determined by CT, asymptomatic  . Diverticulitis   . Family history of heart disease   . GERD (gastroesophageal reflux disease)   . Hyperlipidemia   . Seasonal allergies     Past Surgical History:  Procedure Laterality Date  . CARDIAC CATHETERIZATION  2010   Dr. Gwenlyn Found; less than 30% occlusive disease  . CHOLECYSTECTOMY N/A 06/14/2018   Procedure: LAPAROSCOPIC CHOLECYSTECTOMY;  Surgeon: Ralene Ok, MD;  Location: [facility];  Service: General;  Laterality: N/A;  . TONSILLECTOMY     age 58    There were no vitals filed for this visit.  Subjective Assessment - 11/06/18 0849    Subjective  Planning to get an ankle brace for stability. [place] beach without many issues.    Pertinent History  HLD, GERD, atypical chest pain, arthritis, cardiac cath 2010    Diagnostic tests  none    Patient Stated Goals  "i need to be more flexible"    Currently in Pain?  No/denies                       [facility] Adult PT Treatment/Exercise - 11/06/18 0001      Knee/Hip Exercises: Aerobic   Nustep  L6 x 6 min LEs only      Ankle Exercises: Standing   SLS  single leg RDL  touching 2 stacked bolsters x10 each LE   extra pillow for height on L LE   Heel Raises  Right;Left;20 reps   single leg heel raise at counter top   Toe Walk (Round Trip)  3 min heel to toe walk     Other Standing Ankle Exercises  anterior/posterior and M/L wt shifts on upside down bosu with 2 ski poles 6 min    Other Standing Ankle Exercises  R & L hip abduction and adduction with green TB around toes x10 each LE   cues to maintain ankle in neutral     Ankle Exercises: Stretches   Gastroc Stretch  30 seconds;2 reps    Gastroc Stretch Limitations  at wall    [employer] Stretch Limitations  --             PT Education - 11/06/18 0934    Education Details  update to HEP    Person(s) Educated  Patient    Methods  Explanation;Demonstration;Tactile cues;Verbal cues;Handout    Comprehension  Verbalized understanding;Returned demonstration       PT Short Term Goals - 10/22/18  [facility] [address]  [place], [place], [address] Phone: [phone]   Fax:  [phone]  Physical Therapy Treatment  Patient Details  Name: Dennis Castro. MRN: [id number] Date of Birth: 1961-01-30 Referring Provider (PT): Karlton Lemon, MD   Encounter Date: 11/06/2018  PT End of Session - 11/06/18 0934    Visit Number  12    Number of Visits  17    Date for PT Re-Evaluation  11/13/18    Authorization Type  UHC    PT Start Time  0847    PT Stop Time  0930    PT Time Calculation (min)  43 min    Activity Tolerance  Patient tolerated treatment well    Behavior During Therapy  [facility] for tasks assessed/performed       Past Medical History:  Diagnosis Date  . Arthritis   . Atypical chest pain   . Cholelithiasis 09/2015   determined by CT, asymptomatic  . Diverticulitis   . Family history of heart disease   . GERD (gastroesophageal reflux disease)   . Hyperlipidemia   . Seasonal allergies     Past Surgical History:  Procedure Laterality Date  . CARDIAC CATHETERIZATION  2010   Dr. Gwenlyn Found; less than 30% occlusive disease  . CHOLECYSTECTOMY N/A 06/14/2018   Procedure: LAPAROSCOPIC CHOLECYSTECTOMY;  Surgeon: Ralene Ok, MD;  Location: [facility];  Service: General;  Laterality: N/A;  . TONSILLECTOMY     age 58    There were no vitals filed for this visit.  Subjective Assessment - 11/06/18 0849    Subjective  Planning to get an ankle brace for stability. [place] beach without many issues.    Pertinent History  HLD, GERD, atypical chest pain, arthritis, cardiac cath 2010    Diagnostic tests  none    Patient Stated Goals  "i need to be more flexible"    Currently in Pain?  No/denies                       [facility] Adult PT Treatment/Exercise - 11/06/18 0001      Knee/Hip Exercises: Aerobic   Nustep  L6 x 6 min LEs only      Ankle Exercises: Standing   SLS  single leg RDL  touching 2 stacked bolsters x10 each LE   extra pillow for height on L LE   Heel Raises  Right;Left;20 reps   single leg heel raise at counter top   Toe Walk (Round Trip)  3 min heel to toe walk     Other Standing Ankle Exercises  anterior/posterior and M/L wt shifts on upside down bosu with 2 ski poles 6 min    Other Standing Ankle Exercises  R & L hip abduction and adduction with green TB around toes x10 each LE   cues to maintain ankle in neutral     Ankle Exercises: Stretches   Gastroc Stretch  30 seconds;2 reps    Gastroc Stretch Limitations  at wall    [employer] Stretch Limitations  --             PT Education - 11/06/18 0934    Education Details  update to HEP    Person(s) Educated  Patient    Methods  Explanation;Demonstration;Tactile cues;Verbal cues;Handout    Comprehension  Verbalized understanding;Returned demonstration       PT Short Term Goals - 10/22/18

## 2018-11-08 ENCOUNTER — Other Ambulatory Visit: Payer: Self-pay

## 2018-11-08 ENCOUNTER — Ambulatory Visit: Payer: 59

## 2018-11-08 DIAGNOSIS — M25674 Stiffness of right foot, not elsewhere classified: Secondary | ICD-10-CM

## 2018-11-08 DIAGNOSIS — R262 Difficulty in walking, not elsewhere classified: Secondary | ICD-10-CM

## 2018-11-08 DIAGNOSIS — M79672 Pain in left foot: Secondary | ICD-10-CM | POA: Diagnosis not present

## 2018-11-08 DIAGNOSIS — M79671 Pain in right foot: Secondary | ICD-10-CM

## 2018-11-08 DIAGNOSIS — M25675 Stiffness of left foot, not elsewhere classified: Secondary | ICD-10-CM

## 2018-11-08 DIAGNOSIS — R29898 Other symptoms and signs involving the musculoskeletal system: Secondary | ICD-10-CM

## 2018-11-08 NOTE — Therapy (Addendum)
[facility] [address]  [address] [place], [place], [address] Phone: [phone]   Fax:  [phone]  Physical Therapy Treatment  Patient Details  Name: Jalmer Tooker. MRN: [id number] Date of Birth: Nov 12, 1960 Referring Provider (PT): Norton Blizzard, MD   Encounter Date: 11/08/2018  PT End of Session - 11/08/18 0852    Visit Number  13    Number of Visits  17    Date for PT Re-Evaluation  11/13/18    Authorization Type  UHC    PT Start Time  0848    PT Stop Time  0935    PT Time Calculation (min)  47 min    Activity Tolerance  Patient tolerated treatment well    Behavior During Therapy  [facility] for tasks assessed/performed       Past Medical History:  Diagnosis Date  . Arthritis   . Atypical chest pain   . Cholelithiasis 09/2015   determined by CT, asymptomatic  . Diverticulitis   . Family history of heart disease   . GERD (gastroesophageal reflux disease)   . Hyperlipidemia   . Seasonal allergies     Past Surgical History:  Procedure Laterality Date  . CARDIAC CATHETERIZATION  2010   Dr. Allyson Sabal; less than 30% occlusive disease  . CHOLECYSTECTOMY N/A 06/14/2018   Procedure: LAPAROSCOPIC CHOLECYSTECTOMY;  Surgeon: Axel Filler, MD;  Location: [facility] OR;  Service: General;  Laterality: N/A;  . TONSILLECTOMY     age 58    There were no vitals filed for this visit.  Subjective Assessment - 11/08/18 0851    Subjective  Pt. reporting B medial ankle "tightness"    Pertinent History  HLD, GERD, atypical chest pain, arthritis, cardiac cath 2010    Diagnostic tests  none    Patient Stated Goals  "i need to be more flexible"    Currently in Pain?  Yes    Pain Score  2     Pain Location  Ankle    Pain Orientation  Right;Left    Pain Descriptors / Indicators  --   "tightness"   Pain Type  Chronic pain    Multiple Pain Sites  No                       [facility] Adult PT Treatment/Exercise - 11/08/18 0001       Knee/Hip Exercises: Stretches   Gastroc Stretch  Right;Left;1 rep;60 seconds    Gastroc Stretch Limitations  Prostretch       Knee/Hip Exercises: Standing   SLS with Vectors  B single leg RDL with UE to bolster on table x 5 rpes each way - cues for slow controlled motion - increased difficulty on L       Manual Therapy   Manual Therapy  Soft tissue mobilization;Myofascial release    Manual therapy comments  supine     Soft tissue mobilization  STM to B medial, distal calf musculature targeted at posterior tib    Myofascial Release  Manual TPR to L distal posterior tib      Ankle Exercises: Stretches   Soleus Stretch  2 reps;30 seconds    Gastroc Stretch  30 seconds;2 reps    Gastroc Stretch Limitations  at wall    Slant Board Stretch Limitations  into wall       Ankle Exercises: Supine   T-Band  B IV, EV with yellow 2 x 10 reps  Ankle Exercises: Machines for Strengthening   Cybex Leg Press  Bent and straight knee calf raise 35# x 20 reps each way       Ankle Exercises: Aerobic   Recumbent Bike  Lvl 3, 6 min                PT Short Term Goals - 10/22/18 1621      PT SHORT TERM GOAL #1   Title  Patient to be independent with initial HEP.    Time  4    Period  Weeks    Status  Achieved    Target Date  10/16/18        PT Long Term Goals - 10/22/18 1621      PT LONG TERM GOAL #1   Title  Patient to be independent with advanced HEP.    Time  8    Period  Weeks    Status  Partially Met   met for current     PT LONG TERM GOAL #2   Title  Patient to demonstrate B ankle strength >=4+/5.    Time  8    Period  Weeks    Status  Partially Met   showing improvements in B plantarflexion, B eversion, R inversion strength     PT LONG TERM GOAL #3   Title  Patient to demonstrate B ankle AROM WFL.    Time  8    Period  Weeks    Status  On-going   showing improvements in R plantarflexion, L dorsiflexion and inversion AROM     PT LONG TERM GOAL #4    Title  Patient to report tolerance of walking for 1.5 hours without pain limiting.    Time  8    Period  Weeks    Status  Partially Met   reporting 1 hour without pain     PT LONG TERM GOAL #5   Title  Patient to report tolerance for 2 hours of yard work without onset of pain.    Time  8    Period  Weeks    Status  Achieved   reports 3-4 hours before onset of pain           Plan - 11/08/18 0853    Clinical Impression Statement  Pt. reporting ~ 70% improvement in foot pain since starting therapy.  Primary concern today was B distal/medial ankle/calf "tightness" which he feels is attributed to [place] for prolonged time last [place].  Palpation revealed tenderness and increased tone in B posterior tib musculature and TPR performed to L mid posterior tib.  Session focused on LE stretching and gentle isolated ankle IV/EV strengthening for normalization of tone and improved comfort.  Pt. leaving session reporting relief from "tightness" and reporting he purchased "heel rocker calf stretch" equipment to continue ankle flexibility work at home.  Will continue to progress at toward goals.    Personal Factors and Comorbidities  Age;Behavior Pattern;Comorbidity 3+;Fitness;Past/Current Experience;Profession;Time since onset of injury/illness/exacerbation    Comorbidities  HLD, GERD, atypical chest pain, arthritis, cardiac cath 2010    Rehab Potential  Good    PT Treatment/Interventions  ADLs/Self Care Home Management;Cryotherapy;Electrical Stimulation;Iontophoresis 4mg /ml Dexamethasone;Moist Heat;Balance training;Therapeutic exercise;Therapeutic activities;Functional mobility training;Stair training;Gait training;Ultrasound;Neuromuscular re-education;Patient/family education;Manual techniques;Vasopneumatic Device;Taping;Splinting;Orthotic Fit/Training;Energy conservation;Dry needling;Passive range of motion    PT Next Visit Plan  progress ankle and great toe stretching &  balance strengthening    Consulted and Agree with Plan of  Care  Patient       Patient will benefit from skilled therapeutic intervention in order to improve the following deficits and impairments:  Abnormal gait, Hypomobility, Decreased activity tolerance, Decreased strength, Increased fascial restricitons, Pain, Decreased balance, Difficulty walking, Increased muscle spasms, Improper body mechanics, Decreased range of motion, Impaired flexibility, Postural dysfunction  Visit Diagnosis: 1. Pain in left foot   2. Pain in right foot   3. Stiffness of left foot, not elsewhere classified   4. Stiffness of right foot, not elsewhere classified   5. Difficulty in walking, not elsewhere classified   6. Other symptoms and signs involving the musculoskeletal system        Problem List Patient Active Problem List   Diagnosis Date Noted  . Family history of pancreatic cancer 01/15/2018  . Coronary artery disease 08/10/2017  . Left ankle pain 07/20/2017  . Erectile dysfunction 03/21/2017  . Morbid obesity ([facility]) 07/20/2015  . Annual physical exam 01/17/2015  . PCP NOTES >>> 01/17/2015  . Family history of prostate cancer 03/25/2012  . GERD 04/01/2010  . Hyperlipidemia 10/07/2007    Kermit Balo, PTA 11/08/18 10:12 AM       [facility] [address]  [address] [place], [place], [address] Phone: [phone]   Fax:  [phone]  Name: Khushal Bennion. MRN: [id number] Date of Birth: February 21, 1961  PHYSICAL THERAPY DISCHARGE SUMMARY  Visits from Start of Care: 13  Current functional level related to goals / functional outcomes: Unable to assess; patient did not return d/t work schedule   Remaining deficits: Unable to assess   Education / Equipment: HEP  Plan: Patient agrees to discharge.  Patient goals were not met. Patient is being discharged due to not returning since the last visit.  ?????     Anette Guarneri, PT,  DPT 12/12/18 11:55 AM

## 2018-11-12 ENCOUNTER — Encounter: Payer: 59 | Admitting: Physical Therapy

## 2018-11-15 ENCOUNTER — Ambulatory Visit: Payer: Self-pay | Admitting: General Surgery

## 2018-11-15 NOTE — H&P (Signed)
  History of Present Illness Ralene Ok MD; 11/15/2018 3:14 PM) The patient is a 58 year old male who presents with an incisional hernia. Chief Complaint: Incisional hernia at umbilicus  Patient is a 58 year old male who previously underwent laparoscopic cholecystectomy in March of this year. Patient states that thereafter he did notice increasing small bulge. I did discuss this with him on his postop visit there was a hernia seen Intra-Op. I discussed the is a chance this could recur in the future. Ideally mesh used for repair however this is contraindicated in the laparoscopic cholecystectomy surgery.  Patient states that he has some discomfort with lifting. He states that he has recently lost weight with his helped initially however has regained about 12 pounds. Patient denies any signs or symptoms of incarceration or strangulation. He does state that it reduces on its own.    Allergies (April Staton, CMA; 11/15/2018 3:06 PM) No Known Drug Allergies  [05/06/2018]:  Medication History (April Staton, CMA; 11/15/2018 3:06 PM) Atorvastatin Calcium (20MG  Tablet, Oral) Active. Aspirin (81MG  Tablet, Oral) Active. Medications Reconciled    Review of Systems Ralene Ok, MD; 11/15/2018 3:16 PM) All other systems negative  Vitals (April Staton CMA; 11/15/2018 3:07 PM) 11/15/2018 3:07 PM Weight: 312.5 lb Height: 77in Body Surface Area: 2.7 m Body Mass Index: 37.06 kg/m  Temp.: 97.24F (Oral)  Pulse: 76 (Regular)  BP: 130/84(Sitting, Left Arm, Standard)       Physical Exam Ralene Ok MD; 11/15/2018 3:15 PM) The physical exam findings are as follows: Note: Constitutional: No acute distress, conversant, appears stated age  Eyes: Anicteric sclerae, moist conjunctiva, no lid lag  Neck: No thyromegaly, trachea midline, no cervical lymphadenopathy  Lungs: Clear to auscultation biilaterally, normal respiratory effot  Cardiovascular: regular rate &  rhythm, no murmurs, no peripheal edema, pedal pulses 2+  GI: Soft, no masses or hepatosplenomegaly, non-tender to palpation  MSK: Normal gait, no clubbing cyanosis, edema  Skin: No rashes, palpation reveals normal skin turgor  Psychiatric: Appropriate judgment and insight, oriented to person, place, and time  Abdomen Inspection Hernias - Incisional - Reducible. Note: At umbilicus.    Assessment & Plan Ralene Ok MD; 11/15/2018 3:15 PM)  INCISIONAL HERNIA, WITHOUT OBSTRUCTION OR GANGRENE (K43.2) Impression: Patient is a 58 year old male status post laparoscopic cholecystectomy, who comes in now with an incisional umbilical hernia. 1. The patient will like to proceed to the operating room for laparoscopic umbilical hernia repair with mesh.  2. I discussed with the patient the signs and symptoms of incarceration and strangulation and the need to proceed to the ER should they occur.  3. I discussed with the patient the risks and benefits of the procedure to include but not limited to: Infection, bleeding, damage to surrounding structures, possible need for further surgery, possible nerve pain, and possible recurrence. The patient was understanding and wishes to proceed.

## 2018-11-15 NOTE — H&P (View-Only) (Signed)
  History of Present Illness (La Dibella MD; 11/15/2018 3:14 PM) The patient is a 58 year old male who presents with an incisional hernia. Chief Complaint: Incisional hernia at umbilicus  Patient is a 58-year-old male who previously underwent laparoscopic cholecystectomy in March of this year. Patient states that thereafter he did notice increasing small bulge. I did discuss this with him on his postop visit there was a hernia seen Intra-Op. I discussed the is a chance this could recur in the future. Ideally mesh used for repair however this is contraindicated in the laparoscopic cholecystectomy surgery.  Patient states that he has some discomfort with lifting. He states that he has recently lost weight with his helped initially however has regained about 12 pounds. Patient denies any signs or symptoms of incarceration or strangulation. He does state that it reduces on its own.    Allergies (April Staton, CMA; 11/15/2018 3:06 PM) No Known Drug Allergies  [05/06/2018]:  Medication History (April Staton, CMA; 11/15/2018 3:06 PM) Atorvastatin Calcium (20MG Tablet, Oral) Active. Aspirin (81MG Tablet, Oral) Active. Medications Reconciled    Review of Systems (Carlean Crowl, MD; 11/15/2018 3:16 PM) All other systems negative  Vitals (April Staton CMA; 11/15/2018 3:07 PM) 11/15/2018 3:07 PM Weight: 312.5 lb Height: 77in Body Surface Area: 2.7 m Body Mass Index: 37.06 kg/m  Temp.: 97.8F (Oral)  Pulse: 76 (Regular)  BP: 130/84(Sitting, Left Arm, Standard)       Physical Exam (Dawood Spitler MD; 11/15/2018 3:15 PM) The physical exam findings are as follows: Note: Constitutional: No acute distress, conversant, appears stated age  Eyes: Anicteric sclerae, moist conjunctiva, no lid lag  Neck: No thyromegaly, trachea midline, no cervical lymphadenopathy  Lungs: Clear to auscultation biilaterally, normal respiratory effot  Cardiovascular: regular rate &  rhythm, no murmurs, no peripheal edema, pedal pulses 2+  GI: Soft, no masses or hepatosplenomegaly, non-tender to palpation  MSK: Normal gait, no clubbing cyanosis, edema  Skin: No rashes, palpation reveals normal skin turgor  Psychiatric: Appropriate judgment and insight, oriented to person, place, and time  Abdomen Inspection Hernias - Incisional - Reducible. Note: At umbilicus.    Assessment & Plan (Emeril Stille MD; 11/15/2018 3:15 PM)  INCISIONAL HERNIA, WITHOUT OBSTRUCTION OR GANGRENE (K43.2) Impression: Patient is a 58-year-old male status post laparoscopic cholecystectomy, who comes in now with an incisional umbilical hernia. 1. The patient will like to proceed to the operating room for laparoscopic umbilical hernia repair with mesh.  2. I discussed with the patient the signs and symptoms of incarceration and strangulation and the need to proceed to the ER should they occur.  3. I discussed with the patient the risks and benefits of the procedure to include but not limited to: Infection, bleeding, damage to surrounding structures, possible need for further surgery, possible nerve pain, and possible recurrence. The patient was understanding and wishes to proceed. 

## 2018-12-04 NOTE — Pre-Procedure Instructions (Signed)
Dennis SellersJoseph E Chopp Castro.  12/04/2018      [facility] - [place], [place] - [address] [place] [id number] Phone: [phone] Fax: [phone]    Your procedure is scheduled on Friday, September 11th  [facility] to [facility] admitting  at 5:30 A.M.  Call this number if you have problems the morning of surgery:  [phone]   Remember:  Do not eat or drink after midnight.    Take these medicines the morning of surgery with A SIP OF WATER:  None  Follow your surgeon's instructions on when to stop Aspirin.  If no instructions were given by your surgeon then you will need to call the office to get those instructions.    Starting today,  STOP taking any Aspirin (unless otherwise instructed by your surgeon), Aleve, Naproxen, Ibuprofen, Motrin, Advil, Goody's, BC's, all herbal medications, fish oil, and all vitamins.    Do not wear jewelry  Do not wear lotions, powders, or deodorant.  Men may shave face and neck.  Do not bring valuables to the hospital.  [facility] is not responsible for any belongings or valuables.   [place]- Preparing For Surgery  Before surgery, you can play an important role. Because skin is not sterile, your skin needs to be as free of germs as possible. You can reduce the number of germs on your skin by washing with CHG (chlorahexidine gluconate) Soap before surgery.  CHG is an antiseptic cleaner which kills germs and bonds with the skin to continue killing germs even after washing.    Oral Hygiene is also important to reduce your risk of infection.  Remember - BRUSH YOUR TEETH THE MORNING OF SURGERY WITH YOUR REGULAR TOOTHPASTE  Please do not use if you have an allergy to CHG or antibacterial soaps. If your skin becomes reddened/irritated stop using the CHG.  Do not shave (including legs and underarms) for at least 48 hours prior to first CHG shower. It is OK to shave your face.  Please follow these  instructions carefully.   1. Shower the NIGHT BEFORE SURGERY and the MORNING OF SURGERY with CHG.   2. If you chose to wash your hair, wash your hair first as usual with your normal shampoo.  3. After you shampoo, rinse your hair and body thoroughly to remove the shampoo.  4. Use CHG as you would any other liquid soap. You can apply CHG directly to the skin and wash gently with a scrungie or a clean washcloth.   5. Apply the CHG Soap to your body ONLY FROM THE NECK DOWN.  Do not use on open wounds or open sores. Avoid contact with your eyes, ears, mouth and genitals (private parts). Wash Face and genitals (private parts)  with your normal soap.  6. Wash thoroughly, paying special attention to the area where your surgery will be performed.  7. Thoroughly rinse your body with warm water from the neck down.  8. DO NOT shower/wash with your normal soap after using and rinsing off the CHG Soap.  9. Pat yourself dry with a CLEAN TOWEL.  10. Wear CLEAN PAJAMAS to bed the night before surgery, wear comfortable clothes the morning of surgery  11. Place CLEAN SHEETS on your bed the night of your first shower and DO NOT SLEEP WITH PETS.    Day of Surgery:  Do not apply any deodorants/lotions.  Please wear clean clothes to the hospital/surgery center.  Remember to brush your teeth WITH YOUR REGULAR TOOTHPASTE.    Contacts, dentures or bridgework may not be worn into surgery.  Leave your suitcase in the car.  After surgery it may be brought to your room.  For patients admitted to the hospital, discharge time will be determined by your treatment team.  Patients discharged the day of surgery will not be allowed to drive home.

## 2018-12-05 ENCOUNTER — Encounter (HOSPITAL_COMMUNITY): Payer: Self-pay

## 2018-12-05 ENCOUNTER — Encounter (HOSPITAL_COMMUNITY)
Admission: RE | Admit: 2018-12-05 | Discharge: 2018-12-05 | Disposition: A | Discharge status: Home / Self Care | Payer: 59 | Source: Ambulatory Visit | Attending: General Surgery | Admitting: General Surgery

## 2018-12-05 ENCOUNTER — Other Ambulatory Visit: Payer: Self-pay

## 2018-12-05 DIAGNOSIS — Z01812 Encounter for preprocedural laboratory examination: Secondary | ICD-10-CM | POA: Diagnosis not present

## 2018-12-05 HISTORY — DX: Atherosclerotic heart disease of native coronary artery without angina pectoris: I25.10

## 2018-12-05 LAB — BASIC METABOLIC PANEL
Anion gap: 7 (ref 5–15)
BUN: 11 mg/dL (ref 6–20)
CO2: 27 mmol/L (ref 22–32)
Calcium: 8.9 mg/dL (ref 8.9–10.3)
Chloride: 103 mmol/L (ref 98–111)
Creatinine, Ser: 0.98 mg/dL (ref 0.61–1.24)
GFR calc Af Amer: >60 mL/min (ref >60)
GFR calc non Af Amer: >60 mL/min (ref >60)
Glucose, Bld: 98 mg/dL (ref 70–99)
Potassium: 4.6 mmol/L (ref 3.5–5.1)
Sodium: 137 mmol/L (ref 135–145)

## 2018-12-05 LAB — CBC
HCT: 46.4 % (ref 39.0–52.0)
Hemoglobin: 15 g/dL (ref 13.0–17.0)
MCH: 28.6 pg (ref 26.0–34.0)
MCHC: 32.3 g/dL (ref 30.0–36.0)
MCV: 88.5 fL (ref 80.0–100.0)
Platelets: 296 10*3/uL (ref 150–400)
RBC: 5.24 MIL/uL (ref 4.22–5.81)
RDW: 12.5 % (ref 11.5–15.5)
WBC: 8.8 10*3/uL (ref 4.0–10.5)
nRBC: 0 % (ref 0.0–0.2)

## 2018-12-05 NOTE — Progress Notes (Signed)
PCP - [place] Cardiologist - J. Berry  Chest x-ray - na EKG - 04/15/18 Stress Test - 2011 ECHO - na Cardiac Cath - 2010  Sleep Study - na CPAP -   Fasting Blood Sugar - na Checks Blood Sugar _____ times a day  Blood Thinner Instructions: Aspirin Instructions: will stop 1 week prior to surgery  Anesthesia review: heart hx.( pt. was here in March for gallbladder surgery)  Patient denies shortness of breath, fever, cough and chest pain at PAT appointment   Patient verbalized understanding of instructions that were given to them at the PAT appointment. Patient was also instructed that they will need to review over the PAT instructions again at home before surgery.

## 2018-12-06 NOTE — Anesthesia Preprocedure Evaluation (Addendum)
Anesthesia Evaluation  Patient identified by MRN, date of birth, ID band Patient awake    Reviewed: Allergy & Precautions, NPO status , Patient's Chart, lab work & pertinent test results  Airway Mallampati: II  TM Distance: >3 FB Neck ROM: Full    Dental no notable dental hx. (+) Dental Advisory Given, Teeth Intact   Pulmonary neg pulmonary ROS,    Pulmonary exam normal breath sounds clear to auscultation       Cardiovascular + CAD  Normal cardiovascular exam Rhythm:Regular Rate:Normal  cardiac catheterization 06/24/09 revealing noncritical CAD with at most a 30% hypodense segmental mid LAD stent stenosis with very subtle anteroapical hypokinesia. Medical management of CAD   Neuro/Psych negative neurological ROS  negative psych ROS   GI/Hepatic Neg liver ROS, GERD  ,cholelithiasis   Endo/Other  Morbid obesity  Renal/GU negative Renal ROS  negative genitourinary   Musculoskeletal negative musculoskeletal ROS (+)   Abdominal (+) + obese,   Peds negative pediatric ROS (+)  Hematology negative hematology ROS (+)   Anesthesia Other Findings   Reproductive/Obstetrics negative OB ROS                                                             Anesthesia Evaluation  Patient identified by MRN, date of birth, ID band Patient awake    Reviewed: Allergy & Precautions, NPO status , Patient's Chart, lab work & pertinent test results  Airway Mallampati: II       Dental no notable dental hx. (+) Teeth Intact   Pulmonary neg pulmonary ROS,    Pulmonary exam normal breath sounds clear to auscultation       Cardiovascular + CAD  Normal cardiovascular exam Rhythm:Regular Rate:Normal     Neuro/Psych negative neurological ROS  negative psych ROS   GI/Hepatic Neg liver ROS,   Endo/Other  negative endocrine ROS  Renal/GU negative Renal ROS  negative genitourinary    Musculoskeletal   Abdominal (+) + obese,   Peds  Hematology negative hematology ROS (+)   Anesthesia Other Findings Jerold Coombe, PA-C Physician Assistant Anesthesiology Progress Notes   Signed Date of Service:  06/07/2018 3:00 PM     Signed        Show: ManualTemplateCopied  Added by: Jerold Coombe, PA-C  Hover for details Anesthesia Chart Review:   Case:  [id number] Date/Time:  06/14/18 0715  Procedure:  LAPAROSCOPIC CHOLECYSTECTOMY (N/A )  Anesthesia type:  General  Pre-op diagnosis:  gallstones  [facility]  [facility] ROOM 02 / [facility] OR  Surgeon:  Axel Filler, MD    DISCUSSION: Patient is a 58 year old male scheduled for the above procedure.  History includes never smoker, GERD, HLD, atypical chest pain (LHC 30% LAD 06/24/08, after abnormal Myoview). BMI is consistent with obesity (but has been making lifestyle changes with weight down 10 lbs in January).  He was doing well from a cardiac standpoint at his last visit 04/16/18 by Corine Shelter, PA-C. He wrote, "If he does end up needing cholecystectomy he would be an acceptable risk from a cardiac standpoint without further work up."  He denied chest pain and SOB at [employer] RN visit. If no acute changes then I would anticipate that he can proceed as planned.   VS: BP 129/75   Pulse  60   Temp 36.6 C   Resp 20   Ht 6\' 5"  (1.956 m)   Wt (!) 141 kg   SpO2 94%   BMI 36.86 kg/m    PROVIDERS: Colon Branch, MD is PCP Quay Burow, MD is cardiologist. Last visit 04/16/18 with Kerin Ransom, PA-C.   LABS: Labs reviewed: Acceptable for surgery. CMET on 04/06/18 was WNL.  (all labs ordered are listed, but only abnormal results are displayed)   Labs Reviewed CBC   IMAGES: CXR 07/05/17: IMPRESSION: No edema or consolidation.   EKG: 04/15/18: NSR   CV: Cardiac cath 06/24/08 Gwenlyn Found, Roderic Palau, MD): SELECTIVE CHOLANGIOGRAPHY: 1. Left main normal. 2. LAD; the LAD had a 30%  hypodense segmental stenosis in the mid third of the vessel. 3. Left circumflex; free of significant disease. 4. Ramus branch; small in size and free of significant disease. 5. Right coronary artery; dominant and free of significant disease. 6. Left ventriculography:The overall LVEF was estimated at approximately 50-55% with very subtle anteroapical hypokinesia. IMPRESSION: Mr. Parcel has noncritical coronary artery disease with mild segmental mid left anterior descending disease and mild anteroapical hypokinesia. While this could conceivably be flow limiting lesion, at this point, I am going to treat him medically with beta blockers, statin drugs, and risk factor modification. If indeed he continues to have chest pain consistent with angina. He may require intravascular ultrasound-guided percutaneous coronary intervention stenting of his mid-left anterior descending.    Past Medical History: Diagnosis Date . Arthritis  . Atypical chest pain  . Cholelithiasis 09/2015  determined by CT, asymptomatic . Diverticulitis  . Family history of heart disease  . GERD (gastroesophageal reflux disease)  . Hyperlipidemia  . Seasonal allergies     Past Surgical History: Procedure Laterality Date . CARDIAC CATHETERIZATION  2010  Dr. Gwenlyn Found; less than 30% occlusive disease . TONSILLECTOMY    age 18   MEDICATIONS:  . aspirin EC 81 MG tablet . atorvastatin (LIPITOR) 20 MG tablet . sildenafil (REVATIO) 20 MG tablet   No current facility-administered medications for this encounter.    Myra Gianotti, PA-C Surgical Short Stay/Anesthesiology [facility] Phone [phone] [facility] Phone [phone] 06/10/2018 11:28 AM          Electronically signed by Jacinta Shoe, PA-C at 06/10/2018 11:29 AM    Pre-Admission Testing 60 on 06/07/2018     Detailed Report    Note shared with patient    Reproductive/Obstetrics                            Anesthesia Physical Anesthesia Plan  ASA: III  Anesthesia Plan: General   Post-op Pain Management:    Induction: Intravenous  PONV Risk Score and Plan: 4 or greater and Ondansetron, Dexamethasone and Midazolam  Airway Management Planned: Oral ETT  Additional Equipment:   Intra-op Plan:   Post-operative Plan: Extubation in OR  Informed Consent: I have reviewed the patients History and Physical, chart, labs and discussed the procedure including the risks, benefits and alternatives for the proposed anesthesia with the patient or authorized representative who has indicated his/her understanding and acceptance.     Dental advisory given  Plan Discussed with: CRNA  Anesthesia Plan Comments: (PAT note written 06/10/2018 by Myra Gianotti, PA-C. )      Anesthesia Quick Evaluation  Anesthesia Physical Anesthesia Plan  ASA: III  Anesthesia Plan: General   Post-op Pain Management:    Induction: Intravenous  PONV Risk Score  and Plan: 2 and Ondansetron, Dexamethasone and Midazolam  Airway Management Planned: Oral ETT  Additional Equipment: None  Intra-op Plan:   Post-operative Plan: Extubation in OR  Informed Consent: I have reviewed the patients History and Physical, chart, labs and discussed the procedure including the risks, benefits and alternatives for the proposed anesthesia with the patient or authorized representative who has indicated his/her understanding and acceptance.     Dental advisory given  Plan Discussed with: CRNA  Anesthesia Plan Comments:        Anesthesia Quick Evaluation

## 2018-12-10 ENCOUNTER — Other Ambulatory Visit (HOSPITAL_COMMUNITY)
Admission: RE | Admit: 2018-12-10 | Discharge: 2018-12-10 | Disposition: A | Discharge status: Home / Self Care | Payer: 59 | Source: Ambulatory Visit | Attending: General Surgery | Admitting: General Surgery

## 2018-12-10 DIAGNOSIS — Z20828 Contact with and (suspected) exposure to other viral communicable diseases: Secondary | ICD-10-CM | POA: Diagnosis not present

## 2018-12-10 DIAGNOSIS — Z01812 Encounter for preprocedural laboratory examination: Secondary | ICD-10-CM | POA: Insufficient documentation

## 2018-12-11 LAB — NOVEL CORONAVIRUS, NAA (HOSP ORDER, SEND-OUT TO REF LAB; TAT 18-24 HRS): SARS-CoV-2, NAA: NOT DETECTED

## 2018-12-12 MED ORDER — DEXTROSE 5 % IV SOLN
3.0000 g | INTRAVENOUS | Status: AC
  Administered 2018-12-13: 3 g via INTRAVENOUS
  Filled 2018-12-12: qty 3
  Filled 2018-12-12: qty 3000

## 2018-12-13 ENCOUNTER — Ambulatory Visit (HOSPITAL_COMMUNITY): Payer: 59 | Admitting: Physician Assistant

## 2018-12-13 ENCOUNTER — Encounter (HOSPITAL_COMMUNITY)
Admission: RE | Disposition: A | Discharge status: Home / Self Care | Payer: Self-pay | Source: Ambulatory Visit | Attending: General Surgery

## 2018-12-13 ENCOUNTER — Ambulatory Visit (HOSPITAL_COMMUNITY)
Admission: RE | Admit: 2018-12-13 | Discharge: 2018-12-13 | Disposition: A | Discharge status: Home / Self Care | Payer: 59 | Source: Ambulatory Visit | Attending: General Surgery | Admitting: General Surgery

## 2018-12-13 ENCOUNTER — Ambulatory Visit (HOSPITAL_COMMUNITY): Payer: 59 | Admitting: Anesthesiology

## 2018-12-13 ENCOUNTER — Other Ambulatory Visit: Payer: Self-pay

## 2018-12-13 ENCOUNTER — Encounter (HOSPITAL_COMMUNITY): Payer: Self-pay

## 2018-12-13 DIAGNOSIS — K429 Umbilical hernia without obstruction or gangrene: Secondary | ICD-10-CM | POA: Insufficient documentation

## 2018-12-13 DIAGNOSIS — Z7982 Long term (current) use of aspirin: Secondary | ICD-10-CM | POA: Diagnosis not present

## 2018-12-13 DIAGNOSIS — Z6836 Body mass index (BMI) 36.0-36.9, adult: Secondary | ICD-10-CM | POA: Diagnosis not present

## 2018-12-13 DIAGNOSIS — I2510 Atherosclerotic heart disease of native coronary artery without angina pectoris: Secondary | ICD-10-CM | POA: Diagnosis not present

## 2018-12-13 DIAGNOSIS — K432 Incisional hernia without obstruction or gangrene: Secondary | ICD-10-CM | POA: Diagnosis present

## 2018-12-13 HISTORY — PX: UMBILICAL HERNIA REPAIR: SHX196

## 2018-12-13 HISTORY — PX: INSERTION OF MESH: SHX5868

## 2018-12-13 SURGERY — REPAIR, HERNIA, UMBILICAL, LAPAROSCOPIC
Anesthesia: General | Site: Abdomen

## 2018-12-13 MED ORDER — DEXAMETHASONE SODIUM PHOSPHATE 10 MG/ML IJ SOLN
INTRAMUSCULAR | Status: AC
  Filled 2018-12-13: qty 1

## 2018-12-13 MED ORDER — BUPIVACAINE HCL (PF) 0.25 % IJ SOLN
INTRAMUSCULAR | Status: AC
  Filled 2018-12-13: qty 30

## 2018-12-13 MED ORDER — HYDROMORPHONE HCL 1 MG/ML IJ SOLN
0.2500 mg | INTRAMUSCULAR | Status: DC | PRN
  Administered 2018-12-13: 0.5 mg via INTRAVENOUS

## 2018-12-13 MED ORDER — TRAMADOL HCL 50 MG PO TABS: 50.0000 mg | ORAL_TABLET | Freq: Four times a day (QID) | ORAL | 0 refills | Status: DC | PRN

## 2018-12-13 MED ORDER — CELECOXIB 200 MG PO CAPS
200.0000 mg | ORAL_CAPSULE | ORAL | Status: AC
  Administered 2018-12-13: 07:00:00 200 mg via ORAL

## 2018-12-13 MED ORDER — CHLORHEXIDINE GLUCONATE CLOTH 2 % EX PADS: 6.0000 | MEDICATED_PAD | Freq: Once | CUTANEOUS | Status: DC

## 2018-12-13 MED ORDER — PROMETHAZINE HCL 25 MG/ML IJ SOLN: 6.2500 mg | INTRAMUSCULAR | Status: DC | PRN

## 2018-12-13 MED ORDER — LIDOCAINE 2% (20 MG/ML) 5 ML SYRINGE
INTRAMUSCULAR | Status: DC | PRN
  Administered 2018-12-13: 100 mg via INTRAVENOUS

## 2018-12-13 MED ORDER — STERILE WATER FOR IRRIGATION IR SOLN
Status: DC | PRN
  Administered 2018-12-13: 1000 mL

## 2018-12-13 MED ORDER — ACETAMINOPHEN 500 MG PO TABS
1000.0000 mg | ORAL_TABLET | ORAL | Status: AC
  Administered 2018-12-13: 07:00:00 1000 mg via ORAL

## 2018-12-13 MED ORDER — SUGAMMADEX SODIUM 500 MG/5ML IV SOLN
INTRAVENOUS | Status: DC | PRN
  Administered 2018-12-13: 281.2 mg via INTRAVENOUS

## 2018-12-13 MED ORDER — OXYCODONE HCL 5 MG/5ML PO SOLN: 5.0000 mg | Freq: Once | ORAL | Status: AC | PRN

## 2018-12-13 MED ORDER — ONDANSETRON HCL 4 MG/2ML IJ SOLN
INTRAMUSCULAR | Status: DC | PRN
  Administered 2018-12-13: 4 mg via INTRAVENOUS

## 2018-12-13 MED ORDER — KETOROLAC TROMETHAMINE 30 MG/ML IJ SOLN
INTRAMUSCULAR | Status: AC
  Filled 2018-12-13: qty 1

## 2018-12-13 MED ORDER — OXYCODONE HCL 5 MG PO TABS
ORAL_TABLET | ORAL | Status: AC
  Filled 2018-12-13: qty 1

## 2018-12-13 MED ORDER — OXYCODONE HCL 5 MG PO TABS
5.0000 mg | ORAL_TABLET | Freq: Once | ORAL | Status: AC | PRN
  Administered 2018-12-13: 5 mg via ORAL

## 2018-12-13 MED ORDER — PROPOFOL 10 MG/ML IV BOLUS
INTRAVENOUS | Status: DC | PRN
  Administered 2018-12-13: 150 mg via INTRAVENOUS

## 2018-12-13 MED ORDER — MIDAZOLAM HCL 2 MG/2ML IJ SOLN
INTRAMUSCULAR | Status: AC
  Filled 2018-12-13: qty 2

## 2018-12-13 MED ORDER — PROPOFOL 10 MG/ML IV BOLUS
INTRAVENOUS | Status: AC
  Filled 2018-12-13: qty 20

## 2018-12-13 MED ORDER — ONDANSETRON HCL 4 MG/2ML IJ SOLN
INTRAMUSCULAR | Status: AC
  Filled 2018-12-13: qty 2

## 2018-12-13 MED ORDER — HYDROMORPHONE HCL 1 MG/ML IJ SOLN
0.2500 mg | INTRAMUSCULAR | Status: DC | PRN
  Administered 2018-12-13 (×2): 1 mg via INTRAVENOUS
  Administered 2018-12-13 (×2): 0.5 mg via INTRAVENOUS

## 2018-12-13 MED ORDER — 0.9 % SODIUM CHLORIDE (POUR BTL) OPTIME
TOPICAL | Status: DC | PRN
  Administered 2018-12-13: 1000 mL

## 2018-12-13 MED ORDER — GABAPENTIN 300 MG PO CAPS
ORAL_CAPSULE | ORAL | Status: AC
  Administered 2018-12-13: 300 mg via ORAL
  Filled 2018-12-13: qty 1

## 2018-12-13 MED ORDER — MIDAZOLAM HCL 2 MG/2ML IJ SOLN
INTRAMUSCULAR | Status: DC | PRN
  Administered 2018-12-13: 2 mg via INTRAVENOUS

## 2018-12-13 MED ORDER — ROCURONIUM BROMIDE 10 MG/ML (PF) SYRINGE
PREFILLED_SYRINGE | INTRAVENOUS | Status: DC | PRN
  Administered 2018-12-13: 50 mg via INTRAVENOUS

## 2018-12-13 MED ORDER — FENTANYL CITRATE (PF) 100 MCG/2ML IJ SOLN
INTRAMUSCULAR | Status: DC | PRN
  Administered 2018-12-13: 50 ug via INTRAVENOUS
  Administered 2018-12-13: 100 ug via INTRAVENOUS
  Administered 2018-12-13 (×2): 50 ug via INTRAVENOUS

## 2018-12-13 MED ORDER — DEXAMETHASONE SODIUM PHOSPHATE 10 MG/ML IJ SOLN
INTRAMUSCULAR | Status: DC | PRN
  Administered 2018-12-13: 10 mg via INTRAVENOUS

## 2018-12-13 MED ORDER — LIDOCAINE 2% (20 MG/ML) 5 ML SYRINGE
INTRAMUSCULAR | Status: AC
  Filled 2018-12-13: qty 5

## 2018-12-13 MED ORDER — KETOROLAC TROMETHAMINE 30 MG/ML IJ SOLN
30.0000 mg | Freq: Once | INTRAMUSCULAR | Status: AC | PRN
  Administered 2018-12-13: 30 mg via INTRAVENOUS

## 2018-12-13 MED ORDER — ROCURONIUM BROMIDE 10 MG/ML (PF) SYRINGE
PREFILLED_SYRINGE | INTRAVENOUS | Status: AC
  Filled 2018-12-13: qty 10

## 2018-12-13 MED ORDER — LACTATED RINGERS IV SOLN
INTRAVENOUS | Status: DC | PRN
  Administered 2018-12-13: 07:00:00 via INTRAVENOUS

## 2018-12-13 MED ORDER — ACETAMINOPHEN 500 MG PO TABS
ORAL_TABLET | ORAL | Status: AC
  Administered 2018-12-13: 1000 mg via ORAL
  Filled 2018-12-13: qty 2

## 2018-12-13 MED ORDER — HYDROMORPHONE HCL 1 MG/ML IJ SOLN
INTRAMUSCULAR | Status: AC
  Filled 2018-12-13: qty 1

## 2018-12-13 MED ORDER — GABAPENTIN 300 MG PO CAPS
300.0000 mg | ORAL_CAPSULE | ORAL | Status: AC
  Administered 2018-12-13: 07:00:00 300 mg via ORAL

## 2018-12-13 MED ORDER — FENTANYL CITRATE (PF) 250 MCG/5ML IJ SOLN
INTRAMUSCULAR | Status: AC
  Filled 2018-12-13: qty 5

## 2018-12-13 MED ORDER — BUPIVACAINE HCL 0.25 % IJ SOLN
INTRAMUSCULAR | Status: DC | PRN
  Administered 2018-12-13: 8 mL

## 2018-12-13 MED ORDER — CELECOXIB 200 MG PO CAPS
ORAL_CAPSULE | ORAL | Status: AC
  Administered 2018-12-13: 200 mg via ORAL
  Filled 2018-12-13: qty 1

## 2018-12-13 MED ORDER — SUGAMMADEX SODIUM 500 MG/5ML IV SOLN
INTRAVENOUS | Status: AC
  Filled 2018-12-13: qty 5

## 2018-12-13 SURGICAL SUPPLY — 45 items
ADH SKN CLS APL DERMABOND .7 (GAUZE/BANDAGES/DRESSINGS) ×2
APL PRP STRL LF DISP 70% ISPRP (MISCELLANEOUS) ×2
BLADE CLIPPER SURG (BLADE) IMPLANT
CANISTER SUCT 3000ML PPV (MISCELLANEOUS) IMPLANT
CHLORAPREP W/TINT 26 (MISCELLANEOUS) ×4 IMPLANT
COVER SURGICAL LIGHT HANDLE (MISCELLANEOUS) ×4 IMPLANT
COVER WAND RF STERILE (DRAPES) ×4 IMPLANT
DERMABOND ADVANCED (GAUZE/BANDAGES/DRESSINGS) ×2
DERMABOND ADVANCED .7 DNX12 (GAUZE/BANDAGES/DRESSINGS) ×2 IMPLANT
DEVICE SECURE STRAP 25 ABSORB (INSTRUMENTS) ×6 IMPLANT
DEVICE TROCAR PUNCTURE CLOSURE (ENDOMECHANICALS) ×4 IMPLANT
ELECT REM PT RETURN 9FT ADLT (ELECTROSURGICAL) ×4
ELECTRODE REM PT RTRN 9FT ADLT (ELECTROSURGICAL) ×2 IMPLANT
GLOVE BIO SURGEON STRL SZ7.5 (GLOVE) ×4 IMPLANT
GOWN STRL REUS W/ TWL LRG LVL3 (GOWN DISPOSABLE) ×4 IMPLANT
GOWN STRL REUS W/ TWL XL LVL3 (GOWN DISPOSABLE) ×2 IMPLANT
GOWN STRL REUS W/TWL LRG LVL3 (GOWN DISPOSABLE) ×12
GOWN STRL REUS W/TWL XL LVL3 (GOWN DISPOSABLE) ×4
KIT BASIN OR (CUSTOM PROCEDURE TRAY) ×4 IMPLANT
KIT TURNOVER KIT B (KITS) ×4 IMPLANT
MARKER SKIN DUAL TIP RULER LAB (MISCELLANEOUS) ×4 IMPLANT
MESH VENTRALIGHT ST 6IN CRC (Mesh General) ×2 IMPLANT
NDL INSUFFLATION 14GA 120MM (NEEDLE) ×2 IMPLANT
NDL SPNL 22GX3.5 QUINCKE BK (NEEDLE) IMPLANT
NEEDLE INSUFFLATION 14GA 120MM (NEEDLE) ×4 IMPLANT
NEEDLE SPNL 22GX3.5 QUINCKE BK (NEEDLE) IMPLANT
NS IRRIG 1000ML POUR BTL (IV SOLUTION) ×4 IMPLANT
PAD ARMBOARD 7.5X6 YLW CONV (MISCELLANEOUS) ×8 IMPLANT
SCISSORS LAP 5X35 DISP (ENDOMECHANICALS) ×4 IMPLANT
SET IRRIG TUBING LAPAROSCOPIC (IRRIGATION / IRRIGATOR) IMPLANT
SET TUBE SMOKE EVAC HIGH FLOW (TUBING) ×4 IMPLANT
SLEEVE ENDOPATH XCEL 5M (ENDOMECHANICALS) ×4 IMPLANT
SUT CHROMIC 2 0 SH (SUTURE) ×4 IMPLANT
SUT ETHIBOND 0 MO6 C/R (SUTURE) IMPLANT
SUT MNCRL AB 4-0 PS2 18 (SUTURE) ×4 IMPLANT
SUT NOVA 1 T20/GS 25DT (SUTURE) IMPLANT
SUT NOVA NAB GS-21 0 18 T12 DT (SUTURE) ×2 IMPLANT
SUT PROLENE 2 0 KS (SUTURE) ×6 IMPLANT
TOWEL GREEN STERILE (TOWEL DISPOSABLE) ×4 IMPLANT
TOWEL GREEN STERILE FF (TOWEL DISPOSABLE) ×4 IMPLANT
TRAY LAPAROSCOPIC MC (CUSTOM PROCEDURE TRAY) ×4 IMPLANT
TROCAR XCEL BLUNT TIP 100MML (ENDOMECHANICALS) IMPLANT
TROCAR XCEL NON-BLD 11X100MML (ENDOMECHANICALS) IMPLANT
TROCAR XCEL NON-BLD 5MMX100MML (ENDOMECHANICALS) ×4 IMPLANT
WATER STERILE IRR 1000ML POUR (IV SOLUTION) ×4 IMPLANT

## 2018-12-13 NOTE — Op Note (Signed)
12/13/2018  8:27 AM  PATIENT:  Dennis Castro.  58 y.o. male  PRE-OPERATIVE DIAGNOSIS:  UMBILICAL HERNIA  POST-OPERATIVE DIAGNOSIS:  UMBILICAL HERNIA  PROCEDURE:  Procedure(s): LAPAROSCOPIC UMBILICAL HERNIA REPAIR (N/A) Insertion Of Mesh  SURGEON:  Surgeon(s) and Role:    Ralene Ok, MD - Primary  ANESTHESIA:   local and general  EBL:  minimal   BLOOD ADMINISTERED:none  DRAINS: none   LOCAL MEDICATIONS USED:  BUPIVICAINE   SPECIMEN:  No Specimen  DISPOSITION OF SPECIMEN:  N/A  COUNTS:  YES  TOURNIQUET:  * No tourniquets in log *  DICTATION: .Dragon Dictation  Details of the procedure:   After the patient was consented patient was taken back to the operating room patient was then placed in supine position bilateral SCDs in place.  The patient was prepped and draped in the usual sterile fashion. After antibiotics were confirmed a timeout was called and all facts were verified. The Veress needle technique was used to insuflate the abdomen at Palmer's point. The abdomen was insufflated to 14 mm mercury. Subsequently a 5 mm trocar was placed a camera inserted there was no injury to any intra-abdominal organs.    There was seen to be a non-incarcerated  2cm hernia.  A second camera port was in placed into the left lower quadrant.   At this the Falicform ligament was taken down with Bovie cautery maintaining hemostasis  I proceeded to reduce the hernia contents. The fascia at the hernia was reapproximated using a #1Novafil x 3.  Once the hernia was cleared away, a Bard Ventralight 15.2cm  mesh was inserted into the abdomen.  The mesh was secured circumferentially with am Securestrap tacker in a double crown fashion.  2-0 prolenes were used at 3:00, 6:00, 9:00, and 12:00 as transfascial sutures using a endoclose decive.    The omentum was brought over the area of the mesh. The pneumoperitoneum was evacuated  & all trocars  were removed. The skin was reapproximated with  4-0  Monocryl sutures in a subcuticular fashion. The skin was dressed with Dermabond.  The patient was taken to the recovery room in stable condition.  Type of repair -primary suture & mesh  Mesh overlap - 6cm  Placement of mesh -  beneath fascia and into peritoneal cavity   PLAN OF CARE: Discharge to home after PACU  PATIENT DISPOSITION:  PACU - hemodynamically stable.   Delay start of Pharmacological VTE agent (>24hrs) due to surgical blood loss or risk of bleeding: not applicable

## 2018-12-13 NOTE — Discharge Instructions (Signed)
[facility] _______[facility] ° °HERNIA REPAIR: POST OP INSTRUCTIONS ° °Always review your discharge instruction sheet given to you by the facility where your surgery was performed. °IF YOU HAVE DISABILITY OR FAMILY LEAVE FORMS, YOU MUST BRING THEM TO THE OFFICE FOR PROCESSING.   °DO NOT GIVE THEM TO YOUR DOCTOR. ° °1. A  prescription for pain medication may be given to you upon discharge.  Take your pain medication as prescribed, if needed.  If narcotic pain medicine is not needed, then you may take acetaminophen (Tylenol) or ibuprofen (Advil) as needed. °2. Take your usually prescribed medications unless otherwise directed. °If you need a refill on your pain medication, please contact your pharmacy.  They will contact our office to request authorization. Prescriptions will not be filled after 5 pm or on week-ends. °3. You should follow a light diet the first 24 hours after arrival home, such as soup and crackers, etc.  Be sure to include lots of fluids daily.  Resume your normal diet the day after surgery. °4.Most patients will experience some swelling and bruising around the umbilicus or in the groin and scrotum.  Ice packs and reclining will help.  Swelling and bruising can take several days to resolve.  °6. It is common to experience some constipation if taking pain medication after surgery.  Increasing fluid intake and taking a stool softener (such as Colace) will usually help or prevent this problem from occurring.  A mild laxative (Milk of Magnesia or Miralax) should be taken according to package directions if there are no bowel movements after 48 hours. °7. Unless discharge instructions indicate otherwise, you may remove your bandages 24-48 hours after surgery, and you may shower at that time.  You may have steri-strips (small skin tapes) in place directly over the incision.  These strips should be left on the skin for 7-10 days.  If your surgeon used skin glue on the incision, you may shower in  24 hours.  The glue will flake off over the next 2-3 weeks.  Any sutures or staples will be removed at the office during your follow-up visit. °8. ACTIVITIES:  You may resume regular (light) daily activities beginning the next day--such as daily self-care, walking, climbing stairs--gradually increasing activities as tolerated.  You may have sexual intercourse when it is comfortable.  Refrain from any heavy lifting or straining until approved by your doctor. ° °a.You may drive when you are no longer taking prescription pain medication, you can comfortably wear a seatbelt, and you can safely maneuver your car and apply brakes. °b.RETURN TO WORK:   °_____________________________________________ ° °9.You should see your doctor in the office for a follow-up appointment approximately 2-3 weeks after your surgery.  Make sure that you call for this appointment within a day or two after you arrive home to insure a convenient appointment time. °10.OTHER INSTRUCTIONS: _________________________ °   _____________________________________ ° °WHEN TO CALL YOUR DOCTOR: °1. Fever over 101.0 °2. Inability to urinate °3. Nausea and/or vomiting °4. Extreme swelling or bruising °5. Continued bleeding from incision. °6. Increased pain, redness, or drainage from the incision ° °The clinic staff is available to answer your questions during regular business hours.  Please don’t hesitate to call and ask to speak to one of the nurses for clinical concerns.  If you have a medical emergency, go to the nearest emergency room or call 911.  A surgeon from [facility] is always on call at the hospital ° ° °[address], [address], [place], [place]  [address] ? ° [address] [address], [place], [place]   [address] °[phone] ? [phone] ? [phone] [phone] °Web site: [email or web address] ° °

## 2018-12-13 NOTE — Anesthesia Postprocedure Evaluation (Signed)
Anesthesia Post Note  Patient: Dennis Castro.  Procedure(s) Performed: LAPAROSCOPIC UMBILICAL HERNIA REPAIR (N/A Abdomen) Insertion Of Mesh (Abdomen)     Anesthesia Post Evaluation  Last Vitals:  Vitals:   12/13/18 0842 12/13/18 0858  BP:  126/78  Pulse:  62  Resp:  (!) 9  Temp: 36.6 C   SpO2:  92%    Last Pain:  Vitals:   12/13/18 0925  PainSc: [place]

## 2018-12-13 NOTE — Anesthesia Procedure Notes (Signed)
Procedure Name: Intubation Date/Time: 12/13/2018 7:35 AM Performed by: Barrington Ellison, CRNA Pre-anesthesia Checklist: Patient identified, Emergency Drugs available, Suction available and Patient being monitored Patient Re-evaluated:Patient Re-evaluated prior to induction Oxygen Delivery Method: Circle System Utilized Preoxygenation: Pre-oxygenation with 100% oxygen Induction Type: IV induction Ventilation: Mask ventilation without difficulty Laryngoscope Size: Mac and 4 Grade View: Grade I Tube type: Oral Tube size: 7.5 mm Number of attempts: 1 Airway Equipment and Method: Stylet and Oral airway Placement Confirmation: ETT inserted through vocal cords under direct vision,  positive ETCO2 and breath sounds checked- equal and bilateral Secured at: 23 cm Tube secured with: Tape Dental Injury: Teeth and Oropharynx as per pre-operative assessment

## 2018-12-13 NOTE — Interval H&P Note (Signed)
History and Physical Interval Note:  12/13/2018 7:11 AM  Dennis Castro.  has presented today for surgery, with the diagnosis of UMBILICAL HERNIA.  The various methods of treatment have been discussed with the patient and family. After consideration of risks, benefits and other options for treatment, the patient has consented to  Procedure(s): [place] (N/A) as a surgical intervention.  The patient's history has been reviewed, patient examined, no change in status, stable for surgery.  I have reviewed the patient's chart and labs.  Questions were answered to the patient's satisfaction.     Ralene Ok

## 2018-12-13 NOTE — Transfer of Care (Signed)
Immediate Anesthesia Transfer of Care Note  Patient: Dennis Castro.  Procedure(s) Performed: LAPAROSCOPIC UMBILICAL HERNIA REPAIR (N/A Abdomen) Insertion Of Mesh (Abdomen)  Patient Location: PACU  Anesthesia Type:General  Level of Consciousness: awake, alert  and oriented  Airway & Oxygen Therapy: Patient Spontanous Breathing  Post-op Assessment: Report given to RN  Post vital signs: Reviewed and stable  Last Vitals:  Vitals Value Taken Time  BP    Temp    Pulse 73 12/13/18 0841  Resp 21 12/13/18 0841  SpO2 98 % 12/13/18 0841  Vitals shown include unvalidated device data.  Last Pain:  Vitals:   12/13/18 0624  PainSc: 0-No pain         Complications: No apparent anesthesia complications

## 2018-12-16 ENCOUNTER — Encounter (HOSPITAL_COMMUNITY): Payer: Self-pay | Admitting: General Surgery

## 2019-01-06 ENCOUNTER — Encounter (HOSPITAL_COMMUNITY): Payer: Self-pay | Admitting: General Surgery

## 2019-01-14 ENCOUNTER — Encounter: Payer: Self-pay | Admitting: Internal Medicine

## 2019-01-14 ENCOUNTER — Telehealth: Payer: Self-pay | Admitting: Gastroenterology

## 2019-01-14 ENCOUNTER — Ambulatory Visit (INDEPENDENT_AMBULATORY_CARE_PROVIDER_SITE_OTHER): Payer: 59 | Admitting: Internal Medicine

## 2019-01-14 ENCOUNTER — Other Ambulatory Visit: Payer: Self-pay

## 2019-01-14 VITALS — BP 113/70 | HR 69 | Temp 96.9°F | Resp 16 | Ht 77.0 in | Wt 317.1 lb

## 2019-01-14 DIAGNOSIS — E782 Mixed hyperlipidemia: Secondary | ICD-10-CM | POA: Diagnosis not present

## 2019-01-14 DIAGNOSIS — Z800 Family history of malignant neoplasm of digestive organs: Secondary | ICD-10-CM

## 2019-01-14 DIAGNOSIS — R739 Hyperglycemia, unspecified: Secondary | ICD-10-CM | POA: Diagnosis not present

## 2019-01-14 DIAGNOSIS — Z23 Encounter for immunization: Secondary | ICD-10-CM | POA: Diagnosis not present

## 2019-01-14 DIAGNOSIS — Z0000 Encounter for general adult medical examination without abnormal findings: Secondary | ICD-10-CM | POA: Diagnosis not present

## 2019-01-14 LAB — CBC WITH DIFFERENTIAL/PLATELET
Basophils Absolute: 0 10*3/uL (ref 0.0–0.1)
Basophils Relative: 0.5 % (ref 0.0–3.0)
Eosinophils Absolute: 0.4 10*3/uL (ref 0.0–0.7)
Eosinophils Relative: 4.9 % (ref 0.0–5.0)
HCT: 43.3 % (ref 39.0–52.0)
Hemoglobin: 14.3 g/dL (ref 13.0–17.0)
Lymphocytes Relative: 19.5 % (ref 12.0–46.0)
Lymphs Abs: 1.7 10*3/uL (ref 0.7–4.0)
MCHC: 33.1 g/dL (ref 30.0–36.0)
MCV: 85.9 fl (ref 78.0–100.0)
Monocytes Absolute: 0.8 10*3/uL (ref 0.1–1.0)
Monocytes Relative: 8.9 % (ref 3.0–12.0)
Neutro Abs: 5.7 10*3/uL (ref 1.4–7.7)
Neutrophils Relative %: 66.2 % (ref 43.0–77.0)
Platelets: 273 10*3/uL (ref 150.0–400.0)
RBC: 5.05 Mil/uL (ref 4.22–5.81)
RDW: 13.4 % (ref 11.5–15.5)
WBC: 8.6 10*3/uL (ref 4.0–10.5)

## 2019-01-14 LAB — HEMOGLOBIN A1C: Hgb A1c MFr Bld: 5.9 % (ref 4.6–6.5)

## 2019-01-14 LAB — LIPID PANEL
Cholesterol: 120 mg/dL (ref 0–200)
HDL: 41.1 mg/dL (ref >39.00)
LDL Cholesterol: 61 mg/dL (ref 0–99)
NonHDL: 79.18
Total CHOL/HDL Ratio: 3
Triglycerides: 91 mg/dL (ref 0.0–149.0)
VLDL: 18.2 mg/dL (ref 0.0–40.0)

## 2019-01-14 LAB — AST: AST: 18 U/L (ref 0–37)

## 2019-01-14 LAB — ALT: ALT: 27 U/L (ref 0–53)

## 2019-01-14 NOTE — Telephone Encounter (Signed)
Dr. Havery Moros, there is a referral for pt to have a colonoscopy.  Pt was seen by a genetic counselor and was determined to have a Fhx of colon and pancreatic cancer.    Recall from Dr. Deatra Ina shows that pt is due in 2024.  Please advise.

## 2019-01-14 NOTE — Progress Notes (Signed)
Subjective:    Patient ID: Dennis Sellers., male    DOB: [birth year], 58 y.o.   MRN: [id number]  DOS:  01/14/2019 Type of visit - description: CPX No major concerns  Wt Readings from Last 3 Encounters:  01/14/19 (!) 317 lb 2 oz (143.8 kg)  12/13/18 (!) 310 lb (140.6 kg)  12/05/18 (!) 311 lb 11.2 oz (141.4 kg)   Review of Systems  A 14 point review of systems is negative    Past Medical History:  Diagnosis Date  . Arthritis   . Atypical chest pain   . Cholelithiasis 09/2015   determined by CT, asymptomatic  . Coronary artery disease   . Diverticulitis   . Family history of heart disease   . GERD (gastroesophageal reflux disease)    diet related  . Hyperlipidemia   . Seasonal allergies     Past Surgical History:  Procedure Laterality Date  . CARDIAC CATHETERIZATION  2010   Dr. Allyson Sabal; less than 30% occlusive disease  . CHOLECYSTECTOMY N/A 06/14/2018   Procedure: LAPAROSCOPIC CHOLECYSTECTOMY;  Surgeon: Axel Filler, MD;  Location: [facility] OR;  Service: General;  Laterality: N/A;  . INSERTION OF MESH  12/13/2018   Procedure: Insertion Of Mesh;  Surgeon: Axel Filler, MD;  Location: [facility] OR;  Service: General;;  . TONSILLECTOMY     age 4  . UMBILICAL HERNIA REPAIR N/A 12/13/2018   Procedure: LAPAROSCOPIC UMBILICAL HERNIA REPAIR;  Surgeon: Axel Filler, MD;  Location: [facility] OR;  Service: General;  Laterality: N/A;    Social History   Socioeconomic History  . Marital status: Married    Spouse name: Not on file  . Number of children: 1  . Years of education: Not on file  . Highest education level: Not on file  Occupational History  . Occupation: Production designer, theatre/television/film, office job  Social Needs  . Financial resource strain: Not on file  . Food insecurity    Worry: Not on file    Inability: Not on file  . Transportation needs    Medical: Not on file    Non-medical: Not on file  Tobacco Use  . Smoking status: Never Smoker  . Smokeless tobacco: Never Used  Substance and  Sexual Activity  . Alcohol use: No  . Drug use: No  . Sexual activity: Yes  Lifestyle  . Physical activity    Days per week: Not on file    Minutes per session: Not on file  . Stress: Not on file  Relationships  . Social Musician on phone: Not on file    Gets together: Not on file    Attends religious service: Not on file    Active member of club or organization: Not on file    Attends meetings of clubs or organizations: Not on file    Relationship status: Not on file  . Intimate partner violence    Fear of current or ex partner: Not on file    Emotionally abused: Not on file    Physically abused: Not on file    Forced sexual activity: Not on file  Other Topics Concern  . Not on file  Social History Narrative   Teenage son Dennis Castro, still at home     Family History  Problem Relation Age of Onset  . Diabetes Mother 55  . CAD Mother 32       CBAG  . Colon cancer Mother   . Cancer Father  prostate cancer and panceatic cancer  . Prostate cancer Father   . Breast cancer Maternal Grandmother   . Pancreatic cancer Paternal Grandfather   . Stroke Neg Hx      Allergies as of 01/14/2019   No Known Allergies     Medication List       Accurate as of January 14, 2019 11:59 PM. If you have any questions, ask your nurse or doctor.        STOP taking these medications   traMADol 50 MG tablet Commonly known as: Ultram Stopped by: Willow Ora, MD     TAKE these medications   aspirin EC 81 MG tablet Take 81 mg by mouth daily.   atorvastatin 20 MG tablet Commonly known as: LIPITOR Take 20 mg by mouth daily.   multivitamin with minerals Tabs tablet Take 1 tablet by mouth daily.           Objective:   Physical Exam BP 113/70 (BP Location: Left Arm, Patient Position: Sitting, Cuff Size: Normal)   Pulse 69   Temp (!) 96.9 F (36.1 C) (Temporal)   Resp 16   Ht 6\' 5"  (1.956 m)   Wt (!) 317 lb 2 oz (143.8 kg)   SpO2 96%   BMI 37.61 kg/m   General: Well developed, NAD, BMI noted Neck: No  thyromegaly  HEENT:  Normocephalic . Face symmetric, atraumatic Lungs:  CTA B Normal respiratory effort, no intercostal retractions, no accessory muscle use. Heart: RRR,  no murmur.  No pretibial edema bilaterally  Abdomen:  Not distended, soft, non-tender. No rebound or rigidity.   Skin: Exposed areas without rash. Not pale. Not jaundice Neurologic:  alert & oriented X3.  Speech normal, gait appropriate for age and unassisted Strength symmetric and appropriate for age.  Psych: Cognition and judgment appear intact.  Cooperative with normal attention span and concentration.  Behavior appropriate. No anxious or depressed appearing.     Assessment      Assessment   Hyperlipidemia   GERD Obesity  CAD:  --Chest pain: Myoview stress test subtle anterior ischemia --Cardiac cath 06-24-09: Noncritical 30% LAD with subtle anteroapical HK. Diverticulitis Seasonal allergies +FH prostate ca (father age 65), CAD (mother age 73) GB-kidney  Stones  PLAN: Here for CPX Hyperlipidemia: On Lipitor, checking labs CAD: Asymptomatic FH pancreatic cancer, (father age 9, paternal grandfather age 49).  Also colon and prostate cancer Morbid obesity: Counseled. S/p genetic counseling 11/04/2018: See below  1. Dennis Castro is heterozygous for MUTYH mutation, conferring 2-fold risk of colon cancer. He is recommended to have colonoscopies every 5 years. Since his last colonoscopy was at age 52, he is recommended to have a follow up colonoscopy this year.  2. Patient will be mailed a copy of her genetic test report, MUTYH heterozygous patient information and this summary note.  3. All relatives should be informed of the MUTYH c.536A>G (p.Tyr179Cys) mutation. They are recommended full MUTYH gene analysis.  4. Patient invited to enroll in PROMPT and [place] registries to increase knowledge about hereditary cancer.  5. Dennis Castro was recommended to maintain  regular contact with cancer genetics to provide updates to his personal and family history, particularly any new cancer diagnoses and relatives' genetic test results. These information could modify his current risk assessment. Additionally, cancer genetics test is a rapidly evolving field and there may be new information that could benefit patient and his family in the future. RTC 1 year

## 2019-01-14 NOTE — Patient Instructions (Signed)
GO TO THE LAB : Get the blood work     GO TO THE FRONT DESK Schedule your next appointment for Shingrix No. 2 into 3 months from today  Schedule a physical exam in 1 year

## 2019-01-14 NOTE — Telephone Encounter (Signed)
I reviewed his chart.  Mr. Rodriques is heterozygous for MUTYH mutation and colonoscopy is recommended every 5 years in this setting.  He can be directly booked for a colonoscopy now at the [facility] if you can help coordinate. Thanks

## 2019-01-14 NOTE — Progress Notes (Signed)
Pre visit review using our clinic review tool, if applicable. No additional management support is needed unless otherwise documented below in the visit note. 

## 2019-01-15 ENCOUNTER — Encounter: Payer: Self-pay | Admitting: Internal Medicine

## 2019-01-15 ENCOUNTER — Encounter: Payer: Self-pay | Admitting: Gastroenterology

## 2019-01-15 NOTE — Assessment & Plan Note (Signed)
-  Td  2016 - shingrix #1 today, come back into 3 months for the booster - flu shot today - CCS: Colonoscopy-2014: Diverticuli, no polyps.  Per genetic counseling evaluation, he was recommended a colonoscopy, refer to GI, patient in agreement - Prostate cancer  screening: DRE- PSA  wnl 2019. - Diet, exercise: Patient to restart calorie counting, encourage daily walks. -Labs: AST, ALT, FLP, CBC, A1c

## 2019-01-15 NOTE — Assessment & Plan Note (Addendum)
Here for CPX Hyperlipidemia: On Lipitor, checking labs CAD: Asymptomatic FH pancreatic cancer, (father age 57, paternal grandfather age 65).  Also colon and prostate cancer Morbid obesity: Counseled  S/p genetic counseling 11/04/2018: See below  1. Dennis Castro is heterozygous for MUTYH mutation, conferring 2-fold risk of colon cancer. He is recommended to have colonoscopies every 5 years. Since his last colonoscopy was at age 77, he is recommended to have a follow up colonoscopy this year.  2. Patient will be mailed a copy of her genetic test report, MUTYH heterozygous patient information and this summary note.  3. All relatives should be informed of the MUTYH c.536A>G (p.Tyr179Cys) mutation. They are recommended full MUTYH gene analysis.  4. Patient invited to enroll in PROMPT and [place] registries to increase knowledge about hereditary cancer.  5. Dennis Castro was recommended to maintain regular contact with cancer genetics to provide updates to his personal and family history, particularly any new cancer diagnoses and relatives' genetic test results. These information could modify his current risk assessment. Additionally, cancer genetics test is a rapidly evolving field and there may be new information that could benefit patient and his family in the future. RTC 1 year

## 2019-02-07 ENCOUNTER — Encounter: Payer: Self-pay | Admitting: Gastroenterology

## 2019-02-07 ENCOUNTER — Ambulatory Visit (AMBULATORY_SURGERY_CENTER): Payer: Self-pay

## 2019-02-07 ENCOUNTER — Other Ambulatory Visit: Payer: Self-pay

## 2019-02-07 VITALS — Temp 97.1°F | Ht 77.0 in | Wt 320.0 lb

## 2019-02-07 DIAGNOSIS — Z800 Family history of malignant neoplasm of digestive organs: Secondary | ICD-10-CM

## 2019-02-07 MED ORDER — NA SULFATE-K SULFATE-MG SULF 17.5-3.13-1.6 GM/177ML PO SOLN: 1.0000 | Freq: Once | ORAL | 0 refills | Status: AC

## 2019-02-07 NOTE — Progress Notes (Signed)
Denies allergies to eggs or soy products. Denies complication of anesthesia or sedation. Denies use of weight loss medication. Denies use of O2.   Emmi instructions given for colonoscopy.  Covid screening is scheduled for Tuesday 02/18/19 @ 11:40 Am. A 15.00 coupon for Suprep was given to the patient.

## 2019-02-18 ENCOUNTER — Other Ambulatory Visit: Payer: Self-pay | Admitting: Gastroenterology

## 2019-02-18 ENCOUNTER — Ambulatory Visit (INDEPENDENT_AMBULATORY_CARE_PROVIDER_SITE_OTHER): Payer: 59

## 2019-02-18 DIAGNOSIS — Z1159 Encounter for screening for other viral diseases: Secondary | ICD-10-CM

## 2019-02-19 LAB — SARS CORONAVIRUS 2 (TAT 6-24 HRS): SARS Coronavirus 2: NEGATIVE

## 2019-02-21 ENCOUNTER — Ambulatory Visit (AMBULATORY_SURGERY_CENTER): Payer: 59 | Admitting: Gastroenterology

## 2019-02-21 ENCOUNTER — Other Ambulatory Visit: Payer: Self-pay

## 2019-02-21 ENCOUNTER — Encounter: Payer: Self-pay | Admitting: Gastroenterology

## 2019-02-21 VITALS — BP 109/65 | HR 62 | Temp 97.5°F | Resp 16 | Ht 77.0 in | Wt 320.4 lb

## 2019-02-21 DIAGNOSIS — Z1589 Genetic susceptibility to other disease: Secondary | ICD-10-CM

## 2019-02-21 DIAGNOSIS — Z800 Family history of malignant neoplasm of digestive organs: Secondary | ICD-10-CM | POA: Diagnosis not present

## 2019-02-21 MED ORDER — SODIUM CHLORIDE 0.9 % IV SOLN: 500.0000 mL | Freq: Once | INTRAVENOUS | Status: DC

## 2019-02-21 NOTE — Patient Instructions (Signed)
HANDOUTS GIVEN : DIVERTICULOSIS AND HEMORRHOIDS.  RECALL COLONOSCOPY, November 2025   YOU HAD AN ENDOSCOPIC PROCEDURE TODAY AT THE [facility]:   Refer to the procedure report that was given to you for any specific questions about what was found during the examination.  If the procedure report does not answer your questions, please call your gastroenterologist to clarify.  If you requested that your care partner not be given the details of your procedure findings, then the procedure report has been included in a sealed envelope for you to review at your convenience later.  YOU SHOULD EXPECT: Some feelings of bloating in the abdomen. Passage of more gas than usual.  Walking can help get rid of the air that was put into your GI tract during the procedure and reduce the bloating. If you had a lower endoscopy (such as a colonoscopy or flexible sigmoidoscopy) you may notice spotting of blood in your stool or on the toilet paper. If you underwent a bowel prep for your procedure, you may not have a normal bowel movement for a few days.  Please Note:  You might notice some irritation and congestion in your nose or some drainage.  This is from the oxygen used during your procedure.  There is no need for concern and it should clear up in a day or so.  SYMPTOMS TO REPORT IMMEDIATELY:   Following lower endoscopy (colonoscopy or flexible sigmoidoscopy):  Excessive amounts of blood in the stool  Significant tenderness or worsening of abdominal pains  Swelling of the abdomen that is new, acute  Fever of 100F or higher   For urgent or emergent issues, a gastroenterologist can be reached at any hour by calling [phone].   DIET:  We do recommend a small meal at first, but then you may proceed to your regular diet.  Drink plenty of fluids but you should avoid alcoholic beverages for 24 hours.  ACTIVITY:  You should plan to take it easy for the rest of today and you should NOT DRIVE or use  heavy machinery until tomorrow (because of the sedation medicines used during the test).    FOLLOW UP: Our staff will call the number listed on your records 48-72 hours following your procedure to check on you and address any questions or concerns that you may have regarding the information given to you following your procedure. If we do not reach you, we will leave a message.  We will attempt to reach you two times.  During this call, we will ask if you have developed any symptoms of COVID 19. If you develop any symptoms (ie: fever, flu-like symptoms, shortness of breath, cough etc.) before then, please call [phone].  If you test positive for Covid 19 in the 2 weeks post procedure, please call and report this information to [place].    If any biopsies were taken you will be contacted by phone or by letter within the next 1-3 weeks.  Please call us at [phone] if you have not heard about the biopsies in 3 weeks.    SIGNATURES/CONFIDENTIALITY: You and/or your care partner have signed paperwork which will be entered into your electronic medical record.  These signatures attest to the fact that that the information above on your After Visit Summary has been reviewed and is understood.  Full responsibility of the confidentiality of this discharge information lies with you and/or your care-partner.

## 2019-02-21 NOTE — Progress Notes (Signed)
Pt's states no medical or surgical changes since previsit or office visit.  NS IV, KA, vitals and JB temp.

## 2019-02-21 NOTE — Progress Notes (Signed)
PT taken to PACU. Monitors in place. VSS. Report given to RN. 

## 2019-02-21 NOTE — Op Note (Addendum)
[facility] Patient Name: Dennis Castro Procedure Date: 02/21/2019 7:57 AM MRN: [id number] Endoscopist: Viviann Spare P. Adela Lank , MD Age: 58 Referring MD:  Date of Birth: 1960/11/22 Gender: Male Account #: [id number] Procedure:                Colonoscopy Indications:              positive MUTYH mutation Medicines:                Monitored Anesthesia Care Procedure:                Pre-Anesthesia Assessment:                           - Prior to the procedure, a History and Physical                            was performed, and patient medications and                            allergies were reviewed. The patient's tolerance of                            previous anesthesia was also reviewed. The risks                            and benefits of the procedure and the sedation                            options and risks were discussed with the patient.                            All questions were answered, and informed consent                            was obtained. Prior Anticoagulants: The patient has                            taken no previous anticoagulant or antiplatelet                            agents. ASA Grade Assessment: II - A patient with                            mild systemic disease. After reviewing the risks                            and benefits, the patient was deemed in                            satisfactory condition to undergo the procedure.                           After obtaining informed consent, the colonoscope  was passed under direct vision. Throughout the                            procedure, the patient's blood pressure, pulse, and                            oxygen saturations were monitored continuously. The                            Colonoscope was introduced through the anus and                            advanced to the the cecum, identified by                            appendiceal orifice and ileocecal valve.  The                            colonoscopy was performed without difficulty. The                            patient tolerated the procedure well. The quality                            of the bowel preparation was good. The ileocecal                            valve, appendiceal orifice, and rectum were                            photographed. Scope In: 7:59:56 AM Scope Out: 8:18:58 AM Scope Withdrawal Time: 0 hours 16 minutes 12 seconds  Total Procedure Duration: 0 hours 19 minutes 2 seconds  Findings:                 The perianal and digital rectal examinations were                            normal.                           Multiple medium-mouthed diverticula were found in                            the sigmoid colon, descending colon and transverse                            colon.                           Internal hemorrhoids were found during                            retroflexion. The hemorrhoids were small.  he exam was otherwise without abnormality. Complications:            No immediate complications. Estimated blood loss:                            None. Estimated Blood Loss:     Estimated blood loss: none. Impression:               - Diverticulosis in the sigmoid colon, in the                            descending colon and in the transverse colon.                           - Internal hemorrhoids.                           - The examination was otherwise normal.                           - No polyps Recommendation:           - Patient has a contact number available for                            emergencies. The signs and symptoms of potential                            delayed complications were discussed with the                            patient. Return to normal activities tomorrow.                            Written discharge instructions were provided to the                            patient.                           - Resume previous  diet.                           - Continue present medications.                           - Repeat colonoscopy in 5 years for screening                            purposes. Viviann Spare P. Adela Lank, MD 02/21/2019 8:23:25 AM This report has been signed electronically.

## 2019-02-25 ENCOUNTER — Telehealth: Payer: Self-pay

## 2019-02-25 ENCOUNTER — Telehealth: Payer: Self-pay | Admitting: *Deleted

## 2019-02-25 NOTE — Telephone Encounter (Signed)
  Follow up Call-  Call back number 02/21/2019  Post procedure Call Back phone  # [phone]  Permission to leave phone message Yes  Some recent data might be hidden     Patient questions:  Do you have a fever, pain , or abdominal swelling? No. Pain Score  0 *  Have you tolerated food without any problems? Yes.    Have you been able to return to your normal activities? Yes.    Do you have any questions about your discharge instructions: Diet   No. Medications  No. Follow up visit  No.  Do you have questions or concerns about your Care? No.  Actions: * If pain score is 4 or above: No action needed, pain <4.   1. Have you developed a fever since your procedure? no  2.   Have you had an respiratory symptoms (SOB or cough) since your procedure? no  3.   Have you tested positive for COVID 19 since your procedure no  4.   Have you had any family members/close contacts diagnosed with the COVID 19 since your procedure?  no   If yes to any of these questions please route to Joylene John, RN and Alphonsa Gin, Therapist, sports.

## 2019-02-25 NOTE — Telephone Encounter (Signed)
  Follow up Call-  Call back number 02/21/2019  Post procedure Call Back phone  # [phone]  Permission to leave phone message Yes  Some recent data might be hidden     Left message on voicemail

## 2019-04-09 ENCOUNTER — Ambulatory Visit (INDEPENDENT_AMBULATORY_CARE_PROVIDER_SITE_OTHER): Payer: 59

## 2019-04-09 ENCOUNTER — Other Ambulatory Visit: Payer: Self-pay

## 2019-04-09 DIAGNOSIS — Z23 Encounter for immunization: Secondary | ICD-10-CM | POA: Diagnosis not present

## 2019-04-09 NOTE — Progress Notes (Signed)
i

## 2019-04-30 ENCOUNTER — Other Ambulatory Visit: Payer: Self-pay | Admitting: Cardiology

## 2019-07-08 ENCOUNTER — Other Ambulatory Visit: Payer: Self-pay

## 2019-07-09 ENCOUNTER — Other Ambulatory Visit: Payer: Self-pay

## 2019-07-09 ENCOUNTER — Encounter: Payer: Self-pay | Admitting: Internal Medicine

## 2019-07-09 ENCOUNTER — Ambulatory Visit: Payer: 59 | Admitting: Internal Medicine

## 2019-07-09 VITALS — BP 132/77 | HR 73 | Temp 98.4°F | Resp 18 | Ht 77.0 in | Wt 328.5 lb

## 2019-07-09 DIAGNOSIS — R197 Diarrhea, unspecified: Secondary | ICD-10-CM | POA: Diagnosis not present

## 2019-07-09 DIAGNOSIS — F419 Anxiety disorder, unspecified: Secondary | ICD-10-CM | POA: Diagnosis not present

## 2019-07-09 LAB — CBC WITH DIFFERENTIAL/PLATELET
Basophils Absolute: 0.1 10*3/uL (ref 0.0–0.1)
Basophils Relative: 0.8 % (ref 0.0–3.0)
Eosinophils Absolute: 0.2 10*3/uL (ref 0.0–0.7)
Eosinophils Relative: 2 % (ref 0.0–5.0)
HCT: 43.5 % (ref 39.0–52.0)
Hemoglobin: 14.6 g/dL (ref 13.0–17.0)
Lymphocytes Relative: 17.5 % (ref 12.0–46.0)
Lymphs Abs: 1.4 10*3/uL (ref 0.7–4.0)
MCHC: 33.5 g/dL (ref 30.0–36.0)
MCV: 85.9 fl (ref 78.0–100.0)
Monocytes Absolute: 0.6 10*3/uL (ref 0.1–1.0)
Monocytes Relative: 7 % (ref 3.0–12.0)
Neutro Abs: 5.8 10*3/uL (ref 1.4–7.7)
Neutrophils Relative %: 72.7 % (ref 43.0–77.0)
Platelets: 276 10*3/uL (ref 150.0–400.0)
RBC: 5.06 Mil/uL (ref 4.22–5.81)
RDW: 13.4 % (ref 11.5–15.5)
WBC: 8 10*3/uL (ref 4.0–10.5)

## 2019-07-09 LAB — COMPREHENSIVE METABOLIC PANEL
ALT: 29 U/L (ref 0–53)
AST: 19 U/L (ref 0–37)
Albumin: 4.3 g/dL (ref 3.5–5.2)
Alkaline Phosphatase: 77 U/L (ref 39–117)
BUN: 12 mg/dL (ref 6–23)
CO2: 26 mEq/L (ref 19–32)
Calcium: 9.2 mg/dL (ref 8.4–10.5)
Chloride: 104 mEq/L (ref 96–112)
Creatinine, Ser: 0.95 mg/dL (ref 0.40–1.50)
GFR: 81.47 mL/min (ref >60.00)
Glucose, Bld: 92 mg/dL (ref 70–99)
Potassium: 4 mEq/L (ref 3.5–5.1)
Sodium: 138 mEq/L (ref 135–145)
Total Bilirubin: 0.8 mg/dL (ref 0.2–1.2)
Total Protein: 6.4 g/dL (ref 6.0–8.3)

## 2019-07-09 LAB — TSH: TSH: 1.73 u[IU]/mL (ref 0.35–4.50)

## 2019-07-09 MED ORDER — CHOLESTYRAMINE 4 GM/DOSE PO POWD: 4.0000 g | Freq: Two times a day (BID) | ORAL | 3 refills | Status: DC

## 2019-07-09 NOTE — Progress Notes (Signed)
Subjective:    Patient ID: Dennis Castro., male    DOB: July 07, 1960, 59 y.o.   MRN: [id number]  DOS:  07/09/2019 Type of visit - description: Acute Anxiety: For the last 6 months he is having days that he feels jittery, anxious. Symptoms started occasionally and now are more frequent. When he feels anxious, it last several hours. He is a sleeping okay most of the time, no depression  No clear triggers, perhaps work.  Also having problems with his stomach. Since the gallbladder surgery he is having postprandial diarrhea, stools are loose. Often times the bowel movement feels incomplete and he needs to have 2-3 additional bowel movements to completely evacuate the colon. This come in cycles, does not happen every week.   Wt Readings from Last 3 Encounters:  07/09/19 (!) 328 lb 8 oz (149 kg)  02/21/19 (!) 320 lb 6.4 oz (145.3 kg)  02/07/19 (!) 320 lb (145.2 kg)     Review of Systems No fever chills Occasional nausea but no vomiting, no blood in the stools. Occasionally has a ill-defined abdominal discomfort.   Past Medical History:  Diagnosis Date  . Arthritis   . Atypical chest pain   . Cholelithiasis 09/2015   determined by CT, asymptomatic  . Coronary artery disease   . Diverticulitis   . Family history of heart disease   . GERD (gastroesophageal reflux disease)    diet related  . Hyperlipidemia   . Seasonal allergies     Past Surgical History:  Procedure Laterality Date  . CARDIAC CATHETERIZATION  2010   Dr. Allyson Sabal; less than 30% occlusive disease  . CHOLECYSTECTOMY N/A 06/14/2018   Procedure: LAPAROSCOPIC CHOLECYSTECTOMY;  Surgeon: Axel Filler, MD;  Location: [facility] OR;  Service: General;  Laterality: N/A;  . INSERTION OF MESH  12/13/2018   Procedure: Insertion Of Mesh;  Surgeon: Axel Filler, MD;  Location: [facility] OR;  Service: General;;  . TONSILLECTOMY     age 48  . UMBILICAL HERNIA REPAIR N/A 12/13/2018   Procedure: LAPAROSCOPIC UMBILICAL HERNIA REPAIR;   Surgeon: Axel Filler, MD;  Location: [facility] OR;  Service: General;  Laterality: N/A;    Allergies as of 07/09/2019   No Known Allergies     Medication List       Accurate as of July 09, 2019  9:39 AM. If you have any questions, ask your nurse or doctor.        aspirin EC 81 MG tablet Take 81 mg by mouth daily.   atorvastatin 20 MG tablet Commonly known as: LIPITOR TAKE ONE TABLET BY MOUTH EVERY NIGHT AT BEDTIME   multivitamin with minerals Tabs tablet Take 1 tablet by mouth daily.          Objective:   Physical Exam BP 132/77 (BP Location: Left Arm, Patient Position: Sitting, Cuff Size: Normal)   Pulse 73   Temp 98.4 F (36.9 C) (Temporal)   Resp 18   Ht 6\' 5"  (1.956 m)   Wt (!) 328 lb 8 oz (149 kg)   SpO2 95%   BMI 38.95 kg/m  General:   Well developed, NAD, BMI noted.  HEENT:  Normocephalic . Face symmetric, atraumatic Abdomen:  Not distended, soft, non-tender. No rebound or rigidity. Well-healed surgical scars Skin: Not pale. Not jaundice Lower extremities: no pretibial edema bilaterally  Neurologic:  alert & oriented X3.  Speech normal, gait appropriate for age and unassisted Psych--  Cognition and judgment appear intact.  Cooperative with normal attention span  and concentration.  Behavior appropriate. Slightly apprehensive but not depressed appearing.     Assessment      Assessment   Hyperlipidemia   GERD Obesity  CAD:  --Chest pain: Myoview stress test subtle anterior ischemia --Cardiac cath 06-24-09: Noncritical 30% LAD with subtle anteroapical HK. Diverticulitis Seasonal allergies +FH prostate ca (father age 36), CAD (mother age 12) GB-kidney  Stones  PLAN: Chart reviewed 06/2018 laparoscopic cholecystectomy, uncomplicated per report.  Pathology: Chronic follicular cholecystitis with cholelithiasis. 12/13/2018: Laparoscopic hernia repair with mesh. 02-2019: Colonoscopy no polyps, + diverticulosis.  Anxiety: As described above,  triggers are unclear, no depression. We talk about his treatment options including counseling, increase exercise, daily medications versus prn meds. At this point he does not like to take medications, I strongly encouraged to think about counseling. Will reassess in few months, he will call sooner if problems. Diarrhea: As described above, no fever chills or weight loss. Symptoms started after his gallbladder surgery, did not get worse after the laparoscopic hernia repair. 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 Plan: CMP, CBC, TSH. Trial with cholestyramine, see instructions, call if not improving in 2 to 3 weeks for a GI referral. RTC 3 months    This visit occurred during the SARS-CoV-2 public health emergency.  Safety protocols were in place, including screening questions prior to the visit, additional usage of staff PPE, and extensive cleaning of exam room while observing appropriate contact time as indicated for disinfecting solutions.

## 2019-07-09 NOTE — Progress Notes (Signed)
Pre visit review using our clinic review tool, if applicable. No additional management support is needed unless otherwise documented below in the visit note. 

## 2019-07-09 NOTE — Patient Instructions (Addendum)
Start cholestyramine 4 g: Once daily the first week in the morning Then twice a day with meals. If the diarrhea is not improving in the next 2 to 3 weeks please let me know.    GO TO THE LAB : Get the blood work     GO TO THE FRONT DESK, please reschedule your appointments Come back for   a checkup in 3 months

## 2019-07-10 DIAGNOSIS — F419 Anxiety disorder, unspecified: Secondary | ICD-10-CM | POA: Insufficient documentation

## 2019-07-10 NOTE — Assessment & Plan Note (Signed)
Chart reviewed 06/2018 laparoscopic cholecystectomy, uncomplicated per report.  Pathology: Chronic follicular cholecystitis with cholelithiasis. 12/13/2018: Laparoscopic hernia repair with mesh. 02-2019: Colonoscopy no polyps, + diverticulosis.  Anxiety: As described above, triggers are unclear, no depression. We talk about his treatment options including counseling, increase exercise, daily medications versus prn meds. At this point he does not like to take medications, I strongly encouraged to think about counseling. Will reassess in few months, he will call sooner if problems. Diarrhea: As described above, no fever chills or weight loss. Symptoms started after his gallbladder surgery, did not get worse after the laparoscopic hernia repair. 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 Plan: CMP, CBC, TSH. Trial with cholestyramine, see instructions, call if not improving in 2 to 3 weeks for a GI referral. RTC 3 months

## 2019-08-07 IMAGING — US US ABDOMEN LIMITED
1 series · 14 of 25 positions shown · non-contrast
Comparison: CT 09/20/2015

CLINICAL DATA: Right upper quadrant pain over the last 3 days.

EXAM:
ULTRASOUND ABDOMEN LIMITED RIGHT UPPER QUADRANT

[Series 1: us abdomen limited · 0.28mm/px · 14 of 36 slices shown]
[im 1/36]
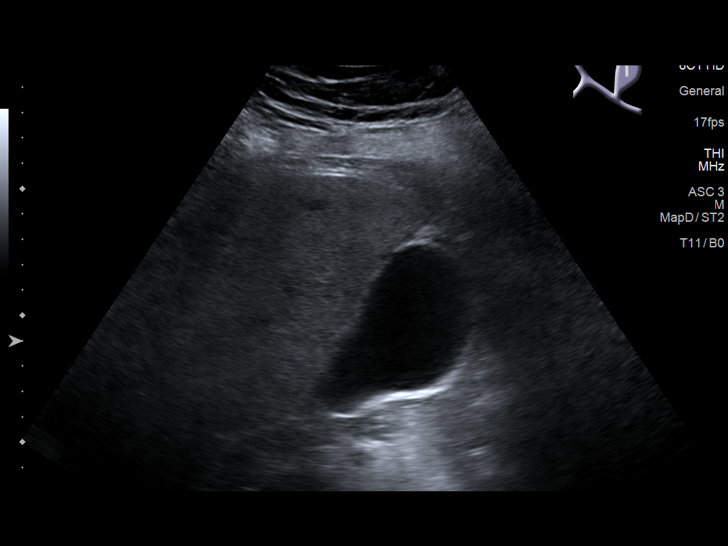
[im 3/36]
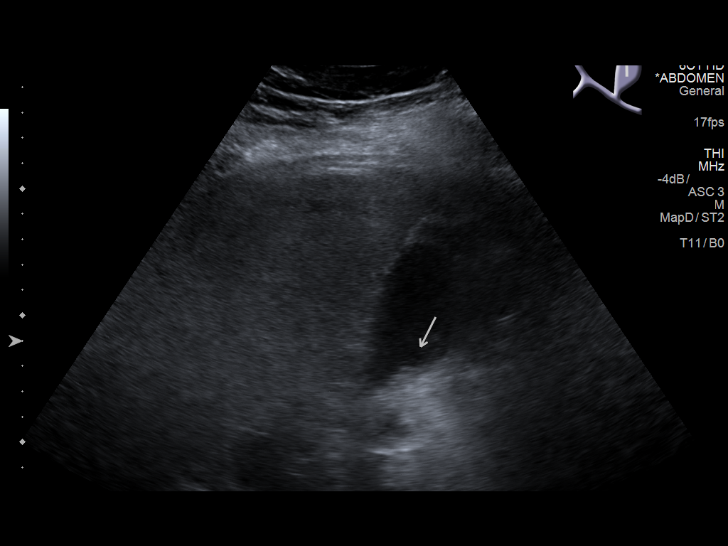
[im 6/36]
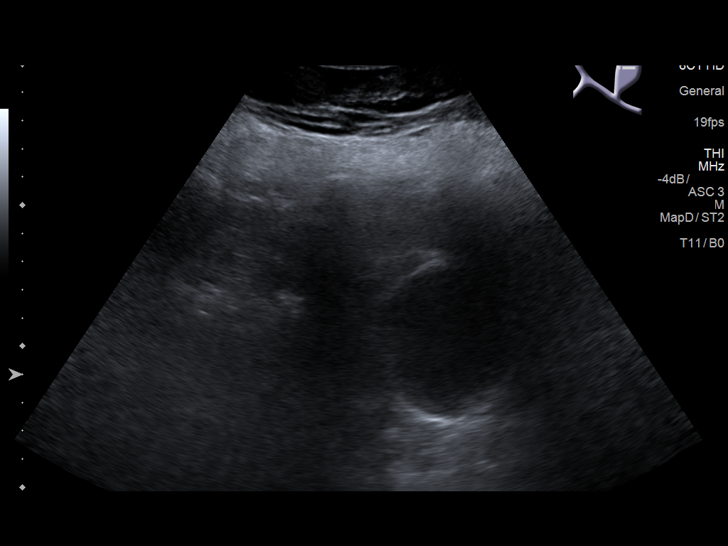
[im 9/36]
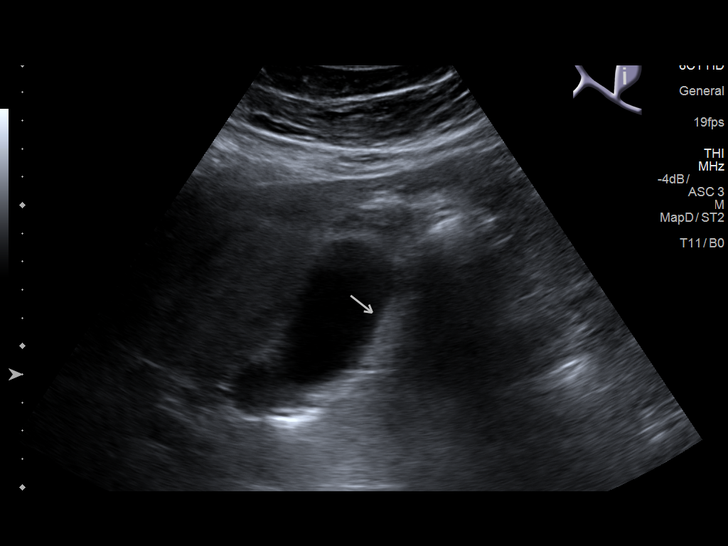
[im 12/36]
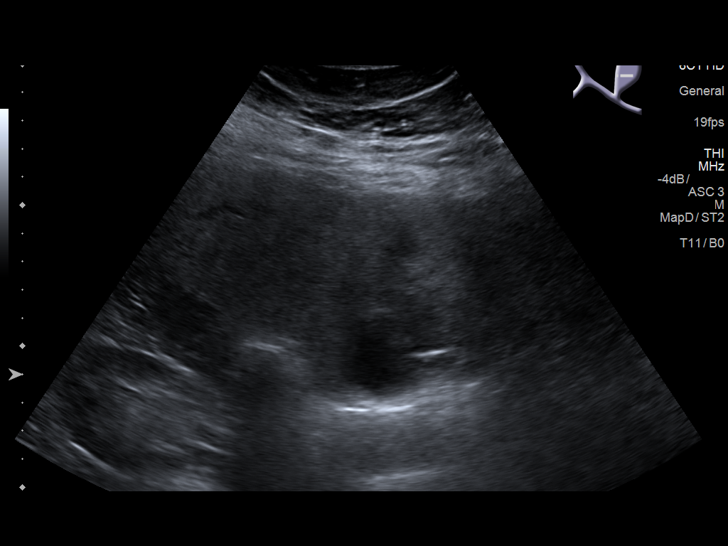
[im 14/36]
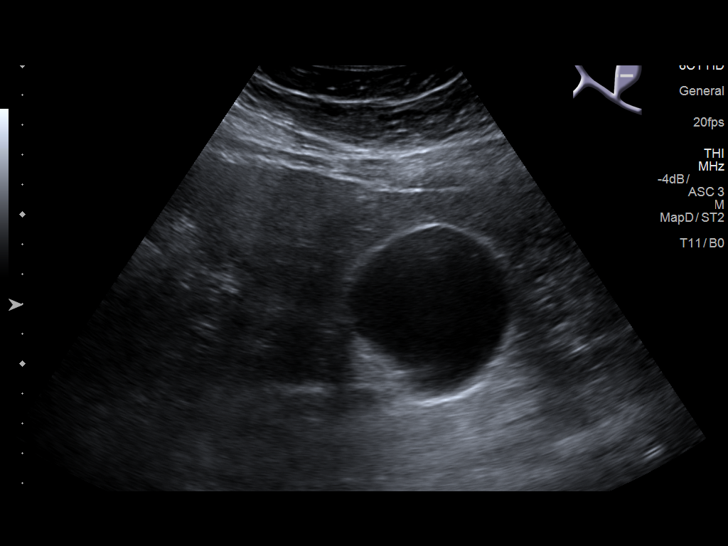
[im 17/36]
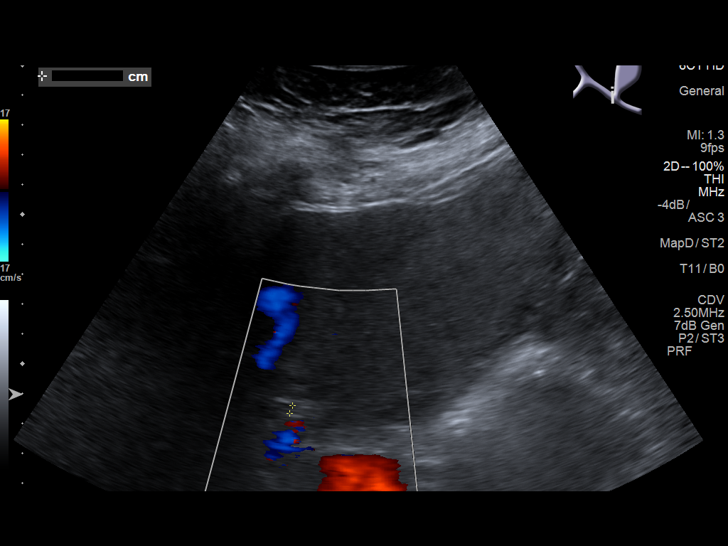
[im 19/36]
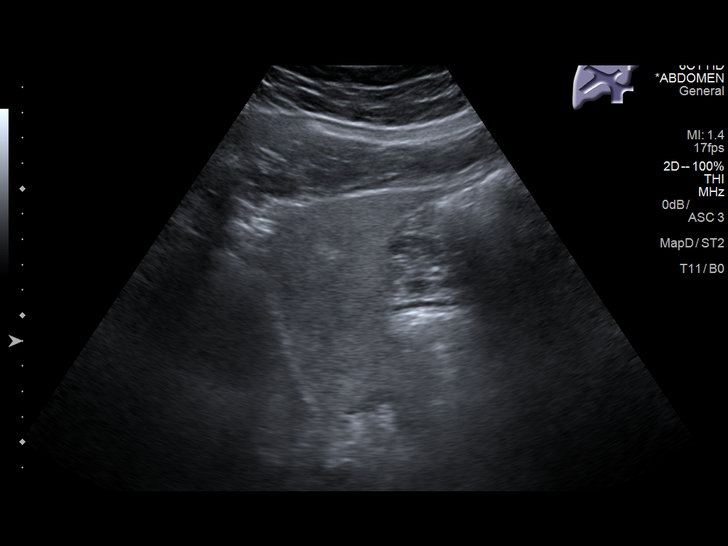
[im 22/36]
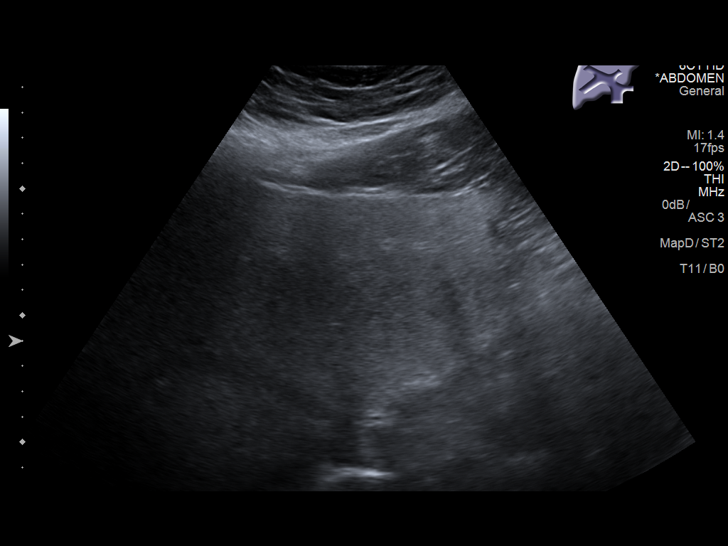
[im 24/36]
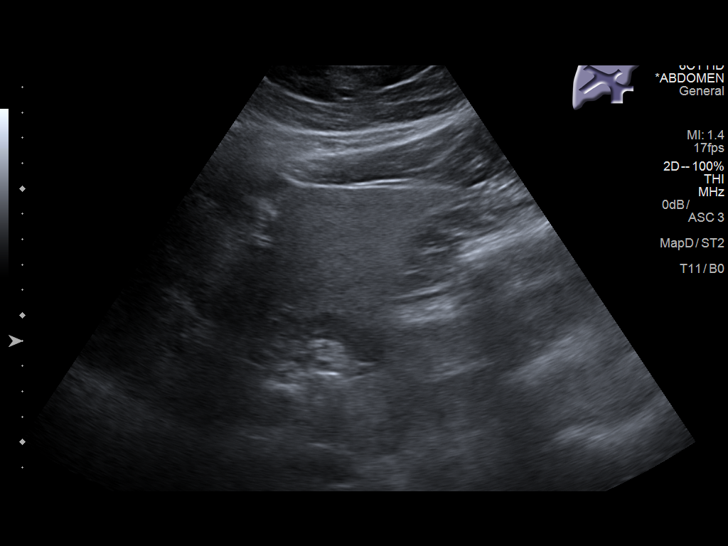
[im 27/36]
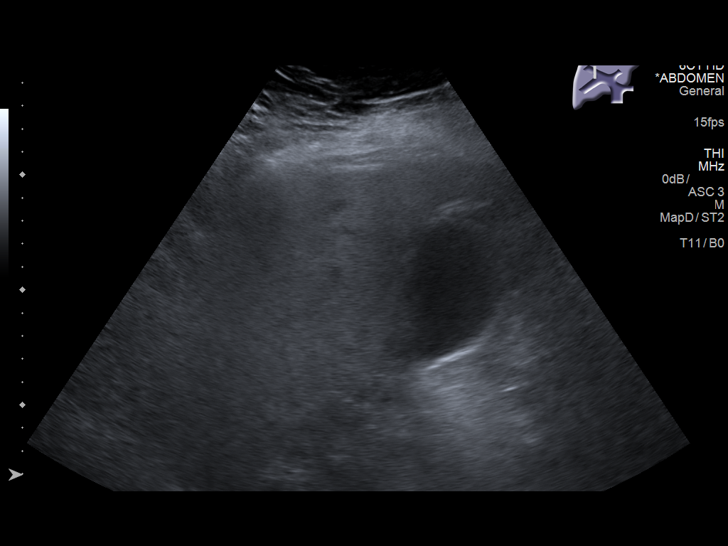
[im 30/36]
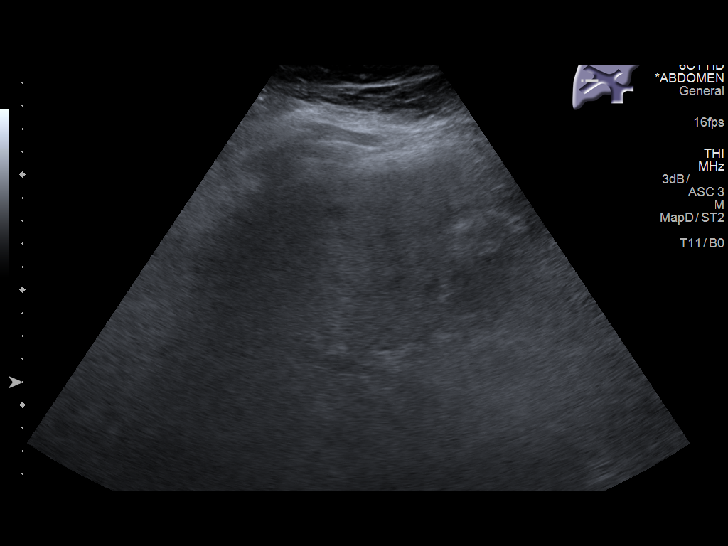
[im 33/36]
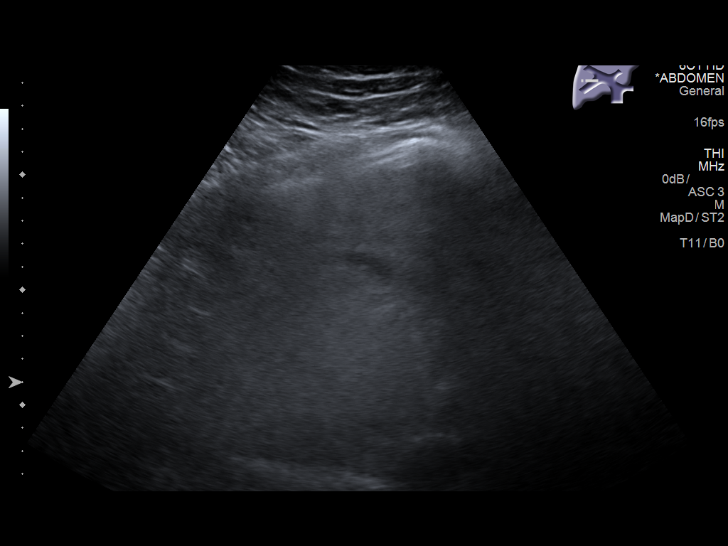
[im 36/36]
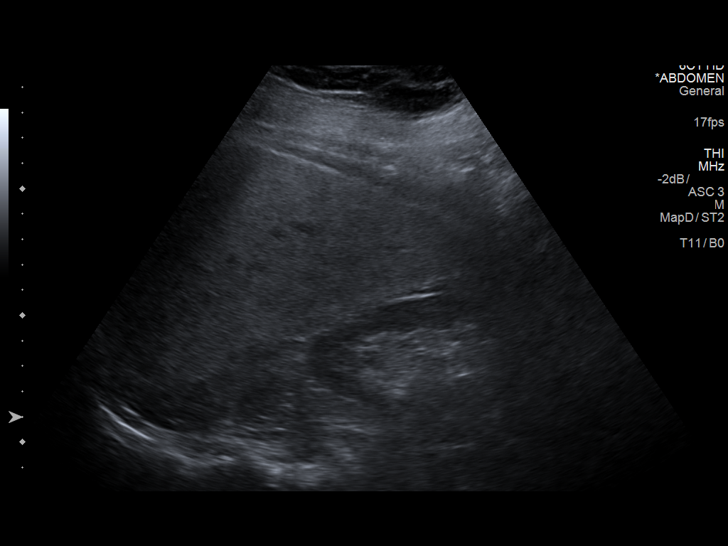

[14 of 25 positions shown; findings below may reference images not displayed]

FINDINGS: Gallbladder:

Nonshadowing echogenic material dependent in the gallbladder
consistent with either sludge or small nonshadowing stones, similar
to the previous CT. No evidence of wall thickening or surrounding
fluid. Negative Murphy sign.

Common bile duct:

Diameter: 3 mm.  Normal.

Liver:

Mild fatty change of the liver with focal sparing adjacent to the
falciform ligament. Portal vein is patent on color Doppler imaging
with normal direction of blood flow towards the liver.
IMPRESSION: Small amount echogenic nonshadowing material dependent in the
gallbladder consistent with the nonshadowing small stones or sludge,
similar to the CT scan of 2566. No ultrasound findings of
cholecystitis or obstruction.

Mild fatty change of the liver.

## 2019-08-13 ENCOUNTER — Ambulatory Visit: Payer: 59 | Admitting: Cardiovascular Disease

## 2019-09-10 ENCOUNTER — Ambulatory Visit: Payer: 59 | Admitting: Cardiovascular Disease

## 2019-09-23 ENCOUNTER — Encounter: Payer: Self-pay | Admitting: Cardiovascular Disease

## 2019-09-23 ENCOUNTER — Ambulatory Visit: Payer: 59 | Admitting: Cardiovascular Disease

## 2019-09-23 ENCOUNTER — Other Ambulatory Visit: Payer: Self-pay

## 2019-09-23 DIAGNOSIS — I2510 Atherosclerotic heart disease of native coronary artery without angina pectoris: Secondary | ICD-10-CM | POA: Diagnosis not present

## 2019-09-23 DIAGNOSIS — E782 Mixed hyperlipidemia: Secondary | ICD-10-CM | POA: Diagnosis not present

## 2019-09-23 NOTE — Patient Instructions (Signed)

## 2019-09-23 NOTE — Assessment & Plan Note (Signed)
History of hyperlipidemia on statin therapy with lipid profile performed 01/14/2019 revealing total cholesterol 120, LDL 61 HDL 41.

## 2019-09-23 NOTE — Assessment & Plan Note (Addendum)
History of noncritical CAD by cath which I performed 06/24/2009 with at most 30% hypodense segmental mid LAD stenosis.  He denies chest pain or shortness of breath.

## 2019-09-23 NOTE — Progress Notes (Signed)
09/23/2019 Dennis Castro.   December 10, 1960  [id number]  Primary Physician Wanda Plump, MD Primary Cardiologist: Runell Gess MD Milagros Loll, [place], [place]  HPI:  Dennis Castro. is a 59 y.o.  severely overweight married Caucasian male father of one Who works at [employer] in [place]. I last saw him for a video telemedicine virtual visit 08/13/2018.Dennis Castro He was having atypical chest pain and had a Myoview stress test that showed subtle anterior ischemia. Based on this he underwent cardiac catheterization by myself 06/24/09 revealing noncritical CAD with at most a 30% hypodense segmental mid LAD stent stenosis with very subtle anteroapical hypokinesia. Medical therapy is recommended. He continued to have atypical chest pain. He does have a family history of heart disease with a mother who had stents in her 62s and bypass surgery at 5.  He was seen in the ER 07/05/2017 with atypical chest pain. His EKG showed no acute changes and he ruled out by enzymes. He had no recurrent symptoms.  He had gone on a diet and exercise campaign and is lost 45 pounds down from 335--->290 #.  He felt clinically improved.  Unfortunately, he has gained all the weight back for various reasons.  Since I saw him virtually a year ago he is had a hernia repair and a gallbladder removed.  He is working from home.  He denies chest pain or shortness of breath.  There are no symptoms compatible with obstructive sleep apnea.  Current Meds  Medication Sig  . aspirin EC 81 MG tablet Take 81 mg by mouth daily.  Dennis Castro atorvastatin (LIPITOR) 20 MG tablet TAKE ONE TABLET BY MOUTH EVERY NIGHT AT BEDTIME  . cholestyramine (QUESTRAN) 4 g packet Take 4 g by mouth daily.  . Multiple Vitamin (MULTIVITAMIN WITH MINERALS) TABS tablet Take 1 tablet by mouth daily.     No Known Allergies  Social History   Socioeconomic History  . Marital status: Married    Spouse name: Not on file  . Number of children: 1  .  Years of education: Not on file  . Highest education level: Not on file  Occupational History  . Occupation: Production designer, theatre/television/film, office job  Tobacco Use  . Smoking status: Never Smoker  . Smokeless tobacco: Never Used  Vaping Use  . Vaping Use: Never used  Substance and Sexual Activity  . Alcohol use: No  . Drug use: No  . Sexual activity: Yes  Other Topics Concern  . Not on file  Social History Narrative   Teenage son [name], still at home   Social Determinants of Health   Financial Resource Strain:   . Difficulty of Paying Living Expenses:   Food Insecurity:   . Worried About Programme researcher, broadcasting/film/video in the Last Year:   . Barista in the Last Year:   Transportation Needs:   . Freight forwarder (Medical):   Dennis Castro Lack of Transportation (Non-Medical):   Physical Activity:   . Days of Exercise per Week:   . Minutes of Exercise per Session:   Stress:   . Feeling of Stress :   Social Connections:   . Frequency of Communication with Friends and Family:   . Frequency of Social Gatherings with Friends and Family:   . Attends Religious Services:   . Active Member of Clubs or Organizations:   . Attends Banker Meetings:   Dennis Castro Marital Status:   Intimate Partner Violence:   .  Fear of Current or Ex-Partner:   . Emotionally Abused:   Dennis Castro Physically Abused:   . Sexually Abused:      Review of Systems: General: negative for chills, fever, night sweats or weight changes.  Cardiovascular: negative for chest pain, dyspnea on exertion, edema, orthopnea, palpitations, paroxysmal nocturnal dyspnea or shortness of breath Dermatological: negative for rash Respiratory: negative for cough or wheezing Urologic: negative for hematuria Abdominal: negative for nausea, vomiting, diarrhea, bright red blood per rectum, melena, or hematemesis Neurologic: negative for visual changes, syncope, or dizziness All other systems reviewed and are otherwise negative except as noted above.    Blood  pressure 134/90, pulse 64, height 6\' 5"  (1.956 m), weight (!) 332 lb 9.6 oz (150.9 kg), SpO2 96 %.  General appearance: alert and no distress Neck: no adenopathy, no carotid bruit, no JVD, supple, symmetrical, trachea midline and thyroid not enlarged, symmetric, no tenderness/mass/nodules Lungs: clear to auscultation bilaterally Heart: regular rate and rhythm, S1, S2 normal, no murmur, click, rub or gallop Extremities: extremities normal, atraumatic, no cyanosis or edema Pulses: 2+ and symmetric Skin: Skin color, texture, turgor normal. No rashes or lesions Neurologic: Alert and oriented X 3, normal strength and tone. Normal symmetric reflexes. Normal coordination and gait  EKG sinus rhythm at 64 without ST or T wave changes.  I personally reviewed this EKG.  ASSESSMENT AND PLAN:   Hyperlipidemia History of hyperlipidemia on statin therapy with lipid profile performed 01/14/2019 revealing total cholesterol 120, LDL 61 HDL 41.  Coronary artery disease History of noncritical CAD by cath which I performed 06/24/2009 with at most 30% hypodense segmental mid LAD stenosis.  He denies chest pain or shortness of breath.      Runell Gess MD FACP,FACC,FAHA, [facility] 09/23/2019 4:00 PM

## 2019-10-03 ENCOUNTER — Ambulatory Visit (INDEPENDENT_AMBULATORY_CARE_PROVIDER_SITE_OTHER): Payer: 59 | Admitting: Internal Medicine

## 2019-10-03 ENCOUNTER — Other Ambulatory Visit: Payer: Self-pay

## 2019-10-03 ENCOUNTER — Encounter: Payer: Self-pay | Admitting: Internal Medicine

## 2019-10-03 VITALS — BP 116/80 | HR 83 | Temp 98.0°F | Resp 18 | Ht 77.0 in | Wt 332.1 lb

## 2019-10-03 DIAGNOSIS — F419 Anxiety disorder, unspecified: Secondary | ICD-10-CM | POA: Diagnosis not present

## 2019-10-03 DIAGNOSIS — R197 Diarrhea, unspecified: Secondary | ICD-10-CM

## 2019-10-03 NOTE — Progress Notes (Signed)
Pre visit review using our clinic review tool, if applicable. No additional management support is needed unless otherwise documented below in the visit note. 

## 2019-10-03 NOTE — Progress Notes (Signed)
Subjective:    Patient ID: Dennis Castro., male    DOB: 21-Aug-1960, 59 y.o.   MRN: [id number]  DOS:  10/03/2019 Type of visit - description: Follow-up Feels well. Today we address anxiety, diarrhea, morbid obesity. He saw cardiology, note reviewed   Review of Systems See above   Past Medical History:  Diagnosis Date  . Arthritis   . Atypical chest pain   . Cholelithiasis 09/2015   determined by CT, asymptomatic  . Coronary artery disease   . Diverticulitis   . Family history of heart disease   . GERD (gastroesophageal reflux disease)    diet related  . Hyperlipidemia   . Seasonal allergies     Past Surgical History:  Procedure Laterality Date  . CARDIAC CATHETERIZATION  2010   Dr. Allyson Sabal; less than 30% occlusive disease  . CHOLECYSTECTOMY N/A 06/14/2018   Procedure: LAPAROSCOPIC CHOLECYSTECTOMY;  Surgeon: Axel Filler, MD;  Location: [facility] OR;  Service: General;  Laterality: N/A;  . INSERTION OF MESH  12/13/2018   Procedure: Insertion Of Mesh;  Surgeon: Axel Filler, MD;  Location: [facility] OR;  Service: General;;  . TONSILLECTOMY     age 48  . UMBILICAL HERNIA REPAIR N/A 12/13/2018   Procedure: LAPAROSCOPIC UMBILICAL HERNIA REPAIR;  Surgeon: Axel Filler, MD;  Location: [facility] OR;  Service: General;  Laterality: N/A;    Allergies as of 10/03/2019   No Known Allergies     Medication List       Accurate as of October 03, 2019 11:59 PM. If you have any questions, ask your nurse or doctor.        aspirin EC 81 MG tablet Take 81 mg by mouth daily.   atorvastatin 20 MG tablet Commonly known as: LIPITOR TAKE ONE TABLET BY MOUTH EVERY NIGHT AT BEDTIME   cholestyramine 4 g packet Commonly known as: QUESTRAN Take 4 g by mouth daily.   multivitamin with minerals Tabs tablet Take 1 tablet by mouth daily.          Objective:   Physical Exam BP 116/80 (BP Location: Left Arm, Patient Position: Sitting, Cuff Size: Normal)   Pulse 83   Temp 98 F (36.7 C) (Oral)    Resp 18   Ht 6\' 5"  (1.956 m)   Wt (!) 332 lb 2 oz (150.7 kg)   SpO2 96%   BMI 39.38 kg/m  General:   Well developed, NAD, BMI noted. HEENT:  Normocephalic . Face symmetric, atraumatic Lower extremities: no pretibial edema bilaterally  Skin: Not pale. Not jaundice Neurologic:  alert & oriented X3.  Speech normal, gait appropriate for age and unassisted Psych--  Cognition and judgment appear intact.  Cooperative with normal attention span and concentration.  Behavior appropriate. No anxious or depressed appearing.      Assessment     Assessment   Hyperlipidemia   GERD Obesity , Morbid  CAD:  --Chest pain: Myoview stress test subtle anterior ischemia --Cardiac cath 06-24-09: Noncritical 30% LAD with subtle anteroapical HK. Diverticulitis Seasonal allergies +FH prostate ca (father age 82), CAD (mother age 48) GB-kidney  Stones  PLAN: Anxiety: See last visit, symptoms quickly resolved, the patient is not sure what triggered them but he is doing well. Diarrhea: See last visit, labs were unremarkable, Questran is definitely helping, he currently taking only as needed, on average once a week lately. Cardiovascular: Saw cardiology 09/23/2019, no new recommendations. Morbid obesity: We had a long conversation about the issue, I actually encouraged him to  embrace a program with a wellness clinic. He understands he needs much more than a "diet", he needs a new way to live and revisit his relationship with food. Okay to exercise only if does not have knee pain, he is at risk of injuries given his BMI. RTC 01-2020 CPX   This visit occurred during the SARS-CoV-2 public health emergency.  Safety protocols were in place, including screening questions prior to the visit, additional usage of staff PPE, and extensive cleaning of exam room while observing appropriate contact time as indicated for disinfecting solutions.

## 2019-10-03 NOTE — Patient Instructions (Signed)
Please consider be seen at the wellness clinic.  They have a great job helping you with weight loss in a healthy way.  Try to take a walk daily, stop if you have any new problems.   GO TO THE FRONT DESK, PLEASE SCHEDULE YOUR APPOINTMENTS Come back for a physical exam by October, fasting

## 2019-10-07 NOTE — Assessment & Plan Note (Signed)
Anxiety: See last visit, symptoms quickly resolved, the patient is not sure what triggered them but he is doing well. Diarrhea: See last visit, labs were unremarkable, Questran is definitely helping, he currently taking only as needed, on average once a week lately. Cardiovascular: Saw cardiology 09/23/2019, no new recommendations. Morbid obesity: We had a long conversation about the issue, I actually encouraged him to embrace a program with a wellness clinic. He understands he needs much more than a "diet", he needs a new way to live and revisit his relationship with food. Okay to exercise only if does not have knee pain, he is at risk of injuries given his BMI. RTC 01-2020 CPX

## 2019-10-08 ENCOUNTER — Ambulatory Visit: Payer: 59 | Admitting: Internal Medicine

## 2019-11-06 ENCOUNTER — Other Ambulatory Visit: Payer: Self-pay | Admitting: Cardiovascular Disease

## 2020-01-19 ENCOUNTER — Encounter: Payer: 59 | Admitting: Internal Medicine

## 2020-02-08 ENCOUNTER — Other Ambulatory Visit: Payer: Self-pay | Admitting: Cardiovascular Disease

## 2020-02-09 ENCOUNTER — Telehealth: Payer: Self-pay

## 2020-02-09 NOTE — Telephone Encounter (Signed)
Received letter from Dr. Maris Berger at [facility]- she saw Pt on 01/23/20 for routine eye exam. Findings is he has very slight exophthalmos in R eye- she states this is common in patients w/ thyroid issues and wants to request a thyroid panel at his physical in December. For further questions she can be reached at [phone]. Letter placed in red PCP folder.

## 2020-02-09 NOTE — Telephone Encounter (Addendum)
Recent TSH normal but will recheck TFTs at the next opportunity.  Will share the results with Dr. Charlotte Sanes. thank you.

## 2020-02-10 NOTE — Telephone Encounter (Signed)
Noted, letter sent to scan.

## 2020-03-05 ENCOUNTER — Other Ambulatory Visit: Payer: Self-pay

## 2020-03-05 ENCOUNTER — Ambulatory Visit (INDEPENDENT_AMBULATORY_CARE_PROVIDER_SITE_OTHER): Payer: 59 | Admitting: Internal Medicine

## 2020-03-05 ENCOUNTER — Encounter: Payer: Self-pay | Admitting: Internal Medicine

## 2020-03-05 VITALS — BP 124/77 | HR 80 | Temp 98.5°F | Ht 77.0 in | Wt 338.0 lb

## 2020-03-05 DIAGNOSIS — R197 Diarrhea, unspecified: Secondary | ICD-10-CM | POA: Diagnosis not present

## 2020-03-05 DIAGNOSIS — Z23 Encounter for immunization: Secondary | ICD-10-CM

## 2020-03-05 DIAGNOSIS — Z0000 Encounter for general adult medical examination without abnormal findings: Secondary | ICD-10-CM

## 2020-03-05 NOTE — Patient Instructions (Signed)
    GO TO THE LAB : Get the blood work     GO TO THE FRONT DESK, PLEASE SCHEDULE YOUR APPOINTMENTS Come back for a checkup in 6 months 

## 2020-03-05 NOTE — Progress Notes (Signed)
Subjective:    Patient ID: Dennis Sellers., male    DOB: Oct 14, 1960, 59 y.o.   MRN: [id number]  DOS:  03/05/2020 Type of visit - description: CPX Here for CPX, multiple other issues discussed.  Wt Readings from Last 3 Encounters:  03/05/20 (!) 338 lb (153.3 kg)  10/03/19 (!) 332 lb 2 oz (150.7 kg)  09/23/19 (!) 332 lb 9.6 oz (150.9 kg)     Review of Systems Continue with GI symptoms but denies blood in the stools.  Other than above, a 14 point review of systems is negative      Past Medical History:  Diagnosis Date  . Arthritis   . Atypical chest pain   . Cholelithiasis 09/2015   determined by CT, asymptomatic  . Coronary artery disease   . Diverticulitis   . Family history of heart disease   . GERD (gastroesophageal reflux disease)    diet related  . Hyperlipidemia   . Seasonal allergies     Past Surgical History:  Procedure Laterality Date  . CARDIAC CATHETERIZATION  2010   Dr. Allyson Sabal; less than 30% occlusive disease  . CHOLECYSTECTOMY N/A 06/14/2018   Procedure: LAPAROSCOPIC CHOLECYSTECTOMY;  Surgeon: Axel Filler, MD;  Location: [facility] OR;  Service: General;  Laterality: N/A;  . INSERTION OF MESH  12/13/2018   Procedure: Insertion Of Mesh;  Surgeon: Axel Filler, MD;  Location: [facility] OR;  Service: General;;  . TONSILLECTOMY     age 43  . UMBILICAL HERNIA REPAIR N/A 12/13/2018   Procedure: LAPAROSCOPIC UMBILICAL HERNIA REPAIR;  Surgeon: Axel Filler, MD;  Location: [facility] OR;  Service: General;  Laterality: N/A;    Allergies as of 03/05/2020   No Known Allergies     Medication List       Accurate as of March 05, 2020 11:59 PM. If you have any questions, ask your nurse or doctor.        aspirin EC 81 MG tablet Take 81 mg by mouth daily.   atorvastatin 20 MG tablet Commonly known as: LIPITOR TAKE ONE TABLET BY MOUTH EVERY NIGHT AT BEDTIME   cholestyramine 4 g packet Commonly known as: QUESTRAN Take 4 g by mouth daily.   multivitamin with  minerals Tabs tablet Take 1 tablet by mouth daily.          Objective:   Physical Exam BP 124/77 (BP Location: Right Arm, Patient Position: Sitting, Cuff Size: Large)   Pulse 80   Temp 98.5 F (36.9 C) (Oral)   Ht 6\' 5"  (1.956 m)   Wt (!) 338 lb (153.3 kg)   SpO2 96%   BMI 40.08 kg/m  General: Well developed, NAD, BMI noted Neck: No  thyromegaly  HEENT:  Normocephalic . Face symmetric, atraumatic Lungs:  CTA B Normal respiratory effort, no intercostal retractions, no accessory muscle use. Heart: RRR,  no murmur.  Abdomen:  Not distended, soft, non-tender. No rebound or rigidity.   Lower extremities: no pretibial edema bilaterally  Skin: Exposed areas without rash. Not pale. Not jaundice DRE: Very limited, no stools. Neurologic:  alert & oriented X3.  Speech normal, gait appropriate for age and unassisted Strength symmetric and appropriate for age.  Psych: Cognition and judgment appear intact.  Cooperative with normal attention span and concentration.  Behavior appropriate. No anxious or depressed appearing.     Assessment       Assessment   Hyperlipidemia   GERD Obesity , Morbid  CAD:  --Chest pain: Myoview stress test subtle  anterior ischemia --Cardiac cath 06-24-09: Noncritical 30% LAD with subtle anteroapical HK. Diverticulitis Seasonal allergies +FH prostate ca (father age 15) +FH CAD (mother age 24) kidney  Stones  PLAN: Here for CPX Question of exophthalmos during routine eye exam, was recommended TFTs. Hyperlipidemia: On atorvastatin, checking labs Morbid obesity: Extensive discussion about diet, exercise, recommend to consider also bariatric surgery. Diarrhea: Again this is on and off problem since he had his gallbladder taken out less than 2 years ago, Questran helps, takes as needed.  Offered a GI referral for further eval, at the end we agreed to continue watching his sxs for few months. RTC 6 months   In addition to CPX we discussed  exophthalmos, hyperlipidemia, morbid obesity and particularly diarrhea. This visit occurred during the SARS-CoV-2 public health emergency.  Safety protocols were in place, including screening questions prior to the visit, additional usage of staff PPE, and extensive cleaning of exam room while observing appropriate contact time as indicated for disinfecting solutions.

## 2020-03-06 LAB — TSH: TSH: 1.88 mIU/L (ref 0.40–4.50)

## 2020-03-06 LAB — LIPID PANEL
Cholesterol: 112 mg/dL (ref <200)
HDL: 33 mg/dL — ABNORMAL LOW (ref >=40)
LDL Cholesterol (Calc): 49 mg/dL (calc)
Non-HDL Cholesterol (Calc): 79 mg/dL (calc) (ref <130)
Total CHOL/HDL Ratio: 3.4 (calc) (ref <5.0)
Triglycerides: 237 mg/dL — ABNORMAL HIGH (ref <150)

## 2020-03-06 LAB — COMPREHENSIVE METABOLIC PANEL
AG Ratio: 1.8 (calc) (ref 1.0–2.5)
ALT: 34 U/L (ref 9–46)
AST: 19 U/L (ref 10–35)
Albumin: 4 g/dL (ref 3.6–5.1)
Alkaline phosphatase (APISO): 82 U/L (ref 35–144)
BUN: 14 mg/dL (ref 7–25)
CO2: 25 mmol/L (ref 20–32)
Calcium: 9.3 mg/dL (ref 8.6–10.3)
Chloride: 106 mmol/L (ref 98–110)
Creat: 1.04 mg/dL (ref 0.70–1.33)
Globulin: 2.2 g/dL (calc) (ref 1.9–3.7)
Glucose, Bld: 97 mg/dL (ref 65–99)
Potassium: 3.9 mmol/L (ref 3.5–5.3)
Sodium: 141 mmol/L (ref 135–146)
Total Bilirubin: 0.4 mg/dL (ref 0.2–1.2)
Total Protein: 6.2 g/dL (ref 6.1–8.1)

## 2020-03-06 LAB — CBC
HCT: 44.2 % (ref 38.5–50.0)
Hemoglobin: 14.8 g/dL (ref 13.2–17.1)
MCH: 28.8 pg (ref 27.0–33.0)
MCHC: 33.5 g/dL (ref 32.0–36.0)
MCV: 86.2 fL (ref 80.0–100.0)
MPV: 10.1 fL (ref 7.5–12.5)
Platelets: 340 10*3/uL (ref 140–400)
RBC: 5.13 10*6/uL (ref 4.20–5.80)
RDW: 12.3 % (ref 11.0–15.0)
WBC: 9.1 10*3/uL (ref 3.8–10.8)

## 2020-03-06 LAB — PSA: PSA: 1.34 ng/mL (ref <=4.0)

## 2020-03-06 LAB — HEMOGLOBIN A1C
Hgb A1c MFr Bld: 5.5 % of total Hgb (ref <5.7)
Mean Plasma Glucose: 111 (calc)
eAG (mmol/L): 6.2 (calc)

## 2020-03-07 NOTE — Assessment & Plan Note (Signed)
Here for CPX Question of exophthalmos during routine eye exam, was recommended TFTs. Hyperlipidemia: On atorvastatin, checking labs Morbid obesity: Extensive discussion about diet, exercise, recommend to consider also bariatric surgery. Diarrhea: Again this is on and off problem since he had his gallbladder taken out less than 2 years ago, Questran helps, takes as needed.  Offered a GI referral for further eval, at the end we agreed to continue watching his sxs for few months. RTC 6 months

## 2020-03-07 NOTE — Assessment & Plan Note (Signed)
-  Td 2016 - shingrix x2 -COVID vaccine: moderna x 2 , Pfizer x1 November 2021 - flu shot today - CCS: Colonoscopy-2014: Diverticuli, no polyps.  Per genetic counseling evaluation, was rec to repeat a colonoscopy, done 02/21/2019.  Next per GI. - Prostate cancer screening: DRE very limited today, check a PSA. - Diet, exercise: Extensively discussed, admits to poor diet, not exercising much.. -Labs:CMP, FLP, CBC, A1c, PSA, TSH, T3, T4

## 2020-04-24 ENCOUNTER — Emergency Department (HOSPITAL_BASED_OUTPATIENT_CLINIC_OR_DEPARTMENT_OTHER)
Admission: EM | Admit: 2020-04-24 | Discharge: 2020-04-25 | Disposition: A | Discharge status: Home / Self Care | Payer: 59 | Attending: Emergency Medicine | Admitting: Emergency Medicine

## 2020-04-24 ENCOUNTER — Emergency Department (HOSPITAL_BASED_OUTPATIENT_CLINIC_OR_DEPARTMENT_OTHER): Payer: 59

## 2020-04-24 ENCOUNTER — Encounter (HOSPITAL_BASED_OUTPATIENT_CLINIC_OR_DEPARTMENT_OTHER): Payer: Self-pay | Admitting: *Deleted

## 2020-04-24 ENCOUNTER — Other Ambulatory Visit: Payer: Self-pay

## 2020-04-24 DIAGNOSIS — I2510 Atherosclerotic heart disease of native coronary artery without angina pectoris: Secondary | ICD-10-CM | POA: Insufficient documentation

## 2020-04-24 DIAGNOSIS — Z20822 Contact with and (suspected) exposure to covid-19: Secondary | ICD-10-CM | POA: Diagnosis not present

## 2020-04-24 DIAGNOSIS — R0602 Shortness of breath: Secondary | ICD-10-CM | POA: Diagnosis not present

## 2020-04-24 DIAGNOSIS — Z7982 Long term (current) use of aspirin: Secondary | ICD-10-CM | POA: Insufficient documentation

## 2020-04-24 LAB — CBC
HCT: 42 % (ref 39.0–52.0)
Hemoglobin: 14.1 g/dL (ref 13.0–17.0)
MCH: 29.1 pg (ref 26.0–34.0)
MCHC: 33.6 g/dL (ref 30.0–36.0)
MCV: 86.6 fL (ref 80.0–100.0)
Platelets: 263 10*3/uL (ref 150–400)
RBC: 4.85 MIL/uL (ref 4.22–5.81)
RDW: 12.9 % (ref 11.5–15.5)
WBC: 8.6 10*3/uL (ref 4.0–10.5)
nRBC: 0 % (ref 0.0–0.2)

## 2020-04-24 LAB — BASIC METABOLIC PANEL
Anion gap: 9 (ref 5–15)
BUN: 12 mg/dL (ref 6–20)
CO2: 24 mmol/L (ref 22–32)
Calcium: 8.4 mg/dL — ABNORMAL LOW (ref 8.9–10.3)
Chloride: 107 mmol/L (ref 98–111)
Creatinine, Ser: 0.96 mg/dL (ref 0.61–1.24)
GFR, Estimated: >60 mL/min (ref >60)
Glucose, Bld: 138 mg/dL — ABNORMAL HIGH (ref 70–99)
Potassium: 3.3 mmol/L — ABNORMAL LOW (ref 3.5–5.1)
Sodium: 140 mmol/L (ref 135–145)

## 2020-04-24 LAB — TROPONIN I (HIGH SENSITIVITY): Troponin I (High Sensitivity): 3 ng/L (ref <18)

## 2020-04-24 NOTE — ED Notes (Signed)
Pt on monitor and vitals cycling 

## 2020-04-24 NOTE — ED Triage Notes (Signed)
Pt reports SOB last night, improved this am. Today he c/o chest "heavy", SOB and throbbing pain in right side of neck

## 2020-04-24 NOTE — ED Notes (Signed)
Patient transported to X-ray 

## 2020-04-25 ENCOUNTER — Encounter (HOSPITAL_BASED_OUTPATIENT_CLINIC_OR_DEPARTMENT_OTHER): Payer: Self-pay | Admitting: Emergency Medicine

## 2020-04-25 LAB — SARS CORONAVIRUS 2 (TAT 6-24 HRS): SARS Coronavirus 2: NEGATIVE

## 2020-04-25 LAB — TROPONIN I (HIGH SENSITIVITY): Troponin I (High Sensitivity): 3 ng/L (ref <18)

## 2020-04-25 NOTE — ED Provider Notes (Signed)
[facility] EMERGENCY DEPARTMENT Provider Note   CSN: [id number] Arrival date & time: 04/24/20  2153     History Chief Complaint  Patient presents with  . Shortness of Breath    Dennis SellersJoseph E Gallaga Jr. is a 60 y.o. male.  The history is provided by the patient.  Shortness of Breath Severity:  Moderate Onset quality:  Gradual Duration:  1 day Timing:  Constant Progression:  Unchanged Chronicity:  New Context: not pollens and not URI   Relieved by:  Nothing Worsened by:  Nothing Ineffective treatments:  None tried Associated symptoms: no abdominal pain, no chest pain, no claudication, no cough, no diaphoresis, no fever, no headaches, no hemoptysis, no neck pain, no sore throat, no sputum production, no syncope, no swollen glands and no vomiting   Risk factors: no recent alcohol use   Patient with subjective SOB for 1 day constantly.  No DOE.  No CP,  No n/v/d.  No known covid exposures.       Past Medical History:  Diagnosis Date  . Arthritis   . Atypical chest pain   . Cholelithiasis 09/2015   determined by CT, asymptomatic  . Coronary artery disease   . Diverticulitis   . Family history of heart disease   . GERD (gastroesophageal reflux disease)    diet related  . Hyperlipidemia   . Seasonal allergies     Patient Active Problem List   Diagnosis Date Noted  . Anxiety 07/10/2019  . Family history of pancreatic cancer 01/15/2018  . Coronary artery disease 08/10/2017  . Left ankle pain 07/20/2017  . Erectile dysfunction 03/21/2017  . Morbid obesity (HCC) 07/20/2015  . Annual physical exam 01/17/2015  . PCP NOTES >>> 01/17/2015  . Family history of prostate cancer 03/25/2012  . GERD 04/01/2010  . Hyperlipidemia 10/07/2007    Past Surgical History:  Procedure Laterality Date  . CARDIAC CATHETERIZATION  2010   Dr. Allyson SabalBerry; less than 30% occlusive disease  . CHOLECYSTECTOMY N/A 06/14/2018   Procedure: LAPAROSCOPIC CHOLECYSTECTOMY;  Surgeon: Axel Filleramirez,  Armando, MD;  [facility];  Service: General;  Laterality: N/A;  . INSERTION OF MESH  12/13/2018   Procedure: Insertion Of Mesh;  Surgeon: Axel Filleramirez, Armando, MD;  [facility];  Service: General;;  . TONSILLECTOMY     age 75  . UMBILICAL HERNIA REPAIR N/A 12/13/2018   Procedure: LAPAROSCOPIC UMBILICAL HERNIA REPAIR;  Surgeon: Axel Filleramirez, Armando, MD;  [facility];  Service: General;  Laterality: N/A;       Family History  Problem Relation Age of Onset  . Diabetes Mother 3778  . CAD Mother 7968       CBAG  . Colon cancer Mother   . Cancer Father        prostate cancer and panceatic cancer  . Prostate cancer Father   . Breast cancer Maternal Grandmother   . Pancreatic cancer Paternal Grandfather   . Stroke Neg Hx   . Esophageal cancer Neg Hx   . Rectal cancer Neg Hx   . Stomach cancer Neg Hx     Social History   Tobacco Use  . Smoking status: Never Smoker  . Smokeless tobacco: Never Used  Vaping Use  . Vaping Use: Never used  Substance Use Topics  . Alcohol use: No  . Drug use: No    Home Medications Prior to Admission medications   Medication Sig Start Date End Date Taking? Authorizing Provider  aspirin EC 81 MG tablet Take 81 mg  by mouth daily.    [provider]  atorvastatin (LIPITOR) 20 MG tablet TAKE ONE TABLET BY MOUTH EVERY NIGHT AT BEDTIME 11/06/19   Runell Gess, MD  cholestyramine (QUESTRAN) 4 g packet Take 4 g by mouth daily.    [provider]  Multiple Vitamin (MULTIVITAMIN WITH MINERALS) TABS tablet Take 1 tablet by mouth daily.    [provider]    Allergies    Patient has no known allergies.  Review of Systems   Review of Systems  Constitutional: Negative for diaphoresis and fever.  HENT: Negative for sore throat.   Eyes: Negative for visual disturbance.  Respiratory: Positive for shortness of breath. Negative for cough, hemoptysis and sputum production.   Cardiovascular: Negative for chest pain, claudication and  syncope.  Gastrointestinal: Negative for abdominal pain and vomiting.  Genitourinary: Negative for difficulty urinating.  Musculoskeletal: Negative for neck pain.  Neurological: Negative for headaches.  Psychiatric/Behavioral: Negative for agitation.  All other systems reviewed and are negative.   Physical Exam Updated Vital Signs BP 106/65   Pulse 63   Temp 98.1 F (36.7 C) (Oral)   Resp 13   Ht 6\' 5"  (1.956 m)   Wt (!) 157 kg   SpO2 96%   BMI 41.04 kg/m   Physical Exam Vitals and nursing note reviewed.  Constitutional:      General: He is not in acute distress.    Appearance: Normal appearance. He is not diaphoretic.  HENT:     Head: Normocephalic and atraumatic.     Nose: Nose normal.  Eyes:     Conjunctiva/sclera: Conjunctivae normal.     Pupils: Pupils are equal, round, and reactive to light.  Cardiovascular:     Rate and Rhythm: Normal rate and regular rhythm.     Pulses: Normal pulses.     Heart sounds: Normal heart sounds.  Pulmonary:     Effort: Pulmonary effort is normal.     Breath sounds: Normal breath sounds.  Abdominal:     General: Abdomen is flat. Bowel sounds are normal.     Palpations: Abdomen is soft.     Tenderness: There is no abdominal tenderness. There is no guarding or rebound.  Musculoskeletal:        General: Normal range of motion.     Cervical back: Normal range of motion and neck supple.  Skin:    General: Skin is warm and dry.     Capillary Refill: Capillary refill takes less than 2 seconds.  Neurological:     General: No focal deficit present.     Mental Status: He is alert and oriented to person, place, and time.     Deep Tendon Reflexes: Reflexes normal.  Psychiatric:        Mood and Affect: Mood normal.        Behavior: Behavior normal.     ED Results / Procedures / Treatments   Labs (all labs ordered are listed, but only abnormal results are displayed) Results for orders placed or performed during the hospital encounter  of 04/24/20  Basic metabolic panel  Result Value Ref Range   Sodium 140 135 - 145 mmol/L   Potassium 3.3 (L) 3.5 - 5.1 mmol/L   Chloride 107 98 - 111 mmol/L   CO2 24 22 - 32 mmol/L   Glucose, Bld 138 (H) 70 - 99 mg/dL   BUN 12 6 - 20 mg/dL   Creatinine, Ser 04/26/20 0.61 - 1.24 mg/dL   Calcium  8.4 (L) 8.9 - 10.3 mg/dL   GFR, Estimated >37 >16 mL/min   Anion gap 9 5 - 15  CBC  Result Value Ref Range   WBC 8.6 4.0 - 10.5 K/uL   RBC 4.85 4.22 - 5.81 MIL/uL   Hemoglobin 14.1 13.0 - 17.0 g/dL   HCT 96.7 89.3 - 81.0 %   MCV 86.6 80.0 - 100.0 fL   MCH 29.1 26.0 - 34.0 pg   MCHC 33.6 30.0 - 36.0 g/dL   RDW 17.5 10.2 - 58.5 %   Platelets 263 150 - 400 K/uL   nRBC 0.0 0.0 - 0.2 %  Troponin I (High Sensitivity)  Result Value Ref Range   Troponin I (High Sensitivity) 3 <18 ng/L   DG Chest 2 View  Result Date: 04/24/2020 CLINICAL DATA:  Chest heaviness, shortness of breath, right-sided neck pain EXAM: CHEST - 2 VIEW COMPARISON:  07/05/2017 FINDINGS: The heart size and mediastinal contours are within normal limits. Both lungs are clear. The visualized skeletal structures are unremarkable. IMPRESSION: No active cardiopulmonary disease. Electronically Signed   By: Sharlet Salina M.D.   On: 04/24/2020 22:28    EKG EKG Interpretation  Date/Time:  Saturday April 24 2020 22:05:31 EST Ventricular Rate:  68 PR Interval:    QRS Duration: 117 QT Interval:  387 QTC Calculation: 412 R Axis:   84 Text Interpretation: Normal sinus rhythm Confirmed by Nicanor Alcon, Sherman Lipuma ([id number]) on 04/24/2020 11:10:04 PM   Radiology DG Chest 2 View  Result Date: 04/24/2020 CLINICAL DATA:  Chest heaviness, shortness of breath, right-sided neck pain EXAM: CHEST - 2 VIEW COMPARISON:  07/05/2017 FINDINGS: The heart size and mediastinal contours are within normal limits. Both lungs are clear. The visualized skeletal structures are unremarkable. IMPRESSION: No active cardiopulmonary disease. Electronically Signed   By:  Sharlet Salina M.D.   On: 04/24/2020 22:28    Procedures Procedures (including critical care time)  Medications Ordered in ED Medications - No data to display  ED Course  I have reviewed the triage vital signs and the nursing notes.  Pertinent labs & imaging results that were available during my care of the patient were reviewed by me and considered in my medical decision making (see chart for details).       Given ongoing symptoms for > 1 day with negative EKG and Troponin ACS is excluded.  I suspect this is either GERD or COVID.  I have sent a covid swab. HEART score is 1 low risk for MACE.  Stable for discharge with close follow up.     Domenick Quebedeaux. was evaluated in Emergency Department on 04/25/2020 for the symptoms described in the history of present illness. He was evaluated in the context of the global COVID-19 pandemic, which necessitated consideration that the patient might be at risk for infection with the SARS-CoV-2 virus that causes COVID-19. Institutional protocols and algorithms that pertain to the evaluation of patients at risk for COVID-19 are in a state of rapid change based on information released by regulatory bodies including the CDC and federal and state organizations. These policies and algorithms were followed during the patient's care in the ED.  Final Clinical Impression(s) / ED Diagnoses Return for intractable cough, coughing up blood, fevers > 100.4 unrelieved by medication, shortness of breath, intractable vomiting, chest pain, shortness of breath, weakness, numbness, changes in speech, facial asymmetry, abdominal pain, passing out, Inability to tolerate liquids or food, cough, altered mental status or any concerns. No signs of  systemic illness or infection. The patient is nontoxic-appearing on exam and vital signs are within normal limits.  I have reviewed the triage vital signs and the nursing notes. Pertinent labs & imaging results that were available  during my care of the patient were reviewed by me and considered in my medical decision making (see chart for details). After history, exam, and medical workup I feel the patient has been appropriately medically screened and is safe for discharge home. Pertinent diagnoses were discussed with the patient. Patient was given return precautions.     Kassia Demarinis, MD 04/25/20 0005

## 2020-05-10 ENCOUNTER — Other Ambulatory Visit: Payer: Self-pay | Admitting: Cardiovascular Disease

## 2020-09-06 ENCOUNTER — Ambulatory Visit: Payer: 59 | Admitting: Internal Medicine

## 2020-09-28 ENCOUNTER — Ambulatory Visit: Payer: 59 | Admitting: Internal Medicine

## 2020-09-28 ENCOUNTER — Other Ambulatory Visit: Payer: Self-pay

## 2020-09-28 ENCOUNTER — Encounter: Payer: Self-pay | Admitting: Internal Medicine

## 2020-09-28 DIAGNOSIS — N529 Male erectile dysfunction, unspecified: Secondary | ICD-10-CM

## 2020-09-28 DIAGNOSIS — E782 Mixed hyperlipidemia: Secondary | ICD-10-CM

## 2020-09-28 MED ORDER — SILDENAFIL CITRATE 20 MG PO TABS: 60.0000 mg | ORAL_TABLET | Freq: Every evening | ORAL | 3 refills | Status: DC | PRN

## 2020-09-28 NOTE — Progress Notes (Signed)
Subjective:    Patient ID: Dennis Castro., male    DOB: [birth year], 60 y.o.   MRN: [id number]  DOS:  09/28/2020 Type of visit - description: ROV  Since the last office visit he is doing great. Noted weight loss, he has changed his lifestyle. High cholesterol: Good med compliance ED: At some point he try sildenafil, it did work.  Refill?.  Wt Readings from Last 3 Encounters:  09/28/20 (!) 331 lb 2 oz (150.2 kg)  04/24/20 (!) 346 lb 2 oz (157 kg)  03/05/20 (!) 338 lb (153.3 kg)    BP Readings from Last 3 Encounters:  09/28/20 134/84  04/25/20 111/75  03/05/20 124/77     Review of Systems See above   Past Medical History:  Diagnosis Date   Arthritis    Atypical chest pain    Cholelithiasis 09/2015   determined by CT, asymptomatic   Coronary artery disease    Diverticulitis    Family history of heart disease    GERD (gastroesophageal reflux disease)    diet related   Hyperlipidemia    Seasonal allergies     Past Surgical History:  Procedure Laterality Date   CARDIAC CATHETERIZATION  2010   Dr. Allyson Sabal; less than 30% occlusive disease   CHOLECYSTECTOMY N/A 06/14/2018   Procedure: LAPAROSCOPIC CHOLECYSTECTOMY;  Surgeon: Axel Filler, MD;  Location: [facility] OR;  Service: General;  Laterality: N/A;   INSERTION OF MESH  12/13/2018   Procedure: Insertion Of Mesh;  Surgeon: Axel Filler, MD;  Location: [facility] OR;  Service: General;;   TONSILLECTOMY     age 58   UMBILICAL HERNIA REPAIR N/A 12/13/2018   Procedure: LAPAROSCOPIC UMBILICAL HERNIA REPAIR;  Surgeon: Axel Filler, MD;  Location: [facility] OR;  Service: General;  Laterality: N/A;    Allergies as of 09/28/2020   No Known Allergies      Medication List        Accurate as of September 28, 2020  3:27 PM. If you have any questions, ask your nurse or doctor.          aspirin EC 81 MG tablet Take 81 mg by mouth daily.   atorvastatin 20 MG tablet Commonly known as: LIPITOR TAKE ONE TABLET BY MOUTH EVERY NIGHT AT  BEDTIME   cholestyramine 4 g packet Commonly known as: QUESTRAN Take 4 g by mouth daily.   multivitamin with minerals Tabs tablet Take 1 tablet by mouth daily.           Objective:   Physical Exam BP 134/84 (BP Location: Left Arm, Patient Position: Sitting, Cuff Size: Normal)   Pulse 64   Temp 98.4 F (36.9 C) (Oral)   Resp 16   Ht 6\' 5"  (1.956 m)   Wt (!) 331 lb 2 oz (150.2 kg)   SpO2 96%   BMI 39.27 kg/m  General:   Well developed, NAD, BMI noted. HEENT:  Normocephalic . Face symmetric, atraumatic Lungs:  CTA B Normal respiratory effort, no intercostal retractions, no accessory muscle use. Heart: RRR,  no murmur.  Lower extremities: no pretibial edema bilaterally  Skin: Not pale. Not jaundice Neurologic:  alert & oriented X3.  Speech normal, gait appropriate for age and unassisted Psych--  Cognition and judgment appear intact.  Cooperative with normal attention span and concentration.  Behavior appropriate. No anxious or depressed appearing.      Assessment     Assessment   Hyperlipidemia   GERD Obesity , Morbid  CAD:  --Chest pain:  Myoview stress test subtle anterior ischemia --Cardiac cath 06-24-09: Noncritical 30% LAD with subtle anteroapical HK. Diverticulitis Seasonal allergies +FH prostate ca (father age 41) +FH CAD (mother age 33) kidney  Stones  PLAN: Hyperlipidemia: Well-controlled on atorvastatin. Morbid obesity: In the last few weeks, he started a walking program he has definitely changed his diet, eating less carbohydrates, + weight loss. Patient is praised, advised not to get frustrated  if his weight falls into a plateau for few weeks. ED: Try sildenafil before, it worked, request a refill, sent.  Remind him of side effects and interaction with NTG. Preventive care: Encouraged to think about the third COVID-vaccine RTC CPX 6 months     This visit occurred during the SARS-CoV-2 public health emergency.  Safety protocols were in  place, including screening questions prior to the visit, additional usage of staff PPE, and extensive cleaning of exam room while observing appropriate contact time as indicated for disinfecting solutions.

## 2020-09-28 NOTE — Patient Instructions (Signed)
    GO TO THE FRONT DESK, PLEASE SCHEDULE YOUR APPOINTMENTS Come back for a physical exam by December

## 2020-09-30 NOTE — Assessment & Plan Note (Signed)
Hyperlipidemia: Well-controlled on atorvastatin. Morbid obesity: In the last few weeks, he started a walking program he has definitely changed his diet, eating less carbohydrates, + weight loss. Patient is praised, advised not to get frustrated  if his weight falls into a plateau for few weeks. ED: Try sildenafil before, it worked, request a refill, sent.  Remind him of side effects and interaction with NTG. Preventive care: Encouraged to think about the third COVID-vaccine RTC CPX 6 months

## 2021-02-18 ENCOUNTER — Other Ambulatory Visit: Payer: Self-pay

## 2021-02-18 ENCOUNTER — Ambulatory Visit: Payer: 59 | Admitting: Family Medicine

## 2021-02-18 ENCOUNTER — Encounter: Payer: Self-pay | Admitting: Family Medicine

## 2021-02-18 VITALS — BP 120/82 | HR 72 | Temp 97.7°F | Ht 77.0 in | Wt 340.0 lb

## 2021-02-18 DIAGNOSIS — M545 Low back pain, unspecified: Secondary | ICD-10-CM | POA: Diagnosis not present

## 2021-02-18 MED ORDER — TIZANIDINE HCL 4 MG PO TABS: 4.0000 mg | ORAL_TABLET | Freq: Four times a day (QID) | ORAL | 0 refills | Status: DC | PRN

## 2021-02-18 MED ORDER — MELOXICAM 15 MG PO TABS: 15.0000 mg | ORAL_TABLET | Freq: Every day | ORAL | 0 refills | Status: DC

## 2021-02-18 NOTE — Patient Instructions (Signed)

## 2021-02-18 NOTE — Progress Notes (Signed)
Musculoskeletal Exam  Patient: Dennis Castro. DOB: 1960-10-31  DOS: 02/18/2021  SUBJECTIVE:  Chief Complaint:   Chief Complaint  Patient presents with   Back Pain    Dennis Castro. is a 60 y.o.  male for evaluation and treatment of back pain.   Onset:  2 weeks ago. No inj or change in activity.  Location: lower R Character:  sharp and stabbing  Progression of issue:  has worsened Associated symptoms: Pain when taking a step.  Denies bowel/bladder incontinence, redness, bruising, swelling.  Treatment: to date has been OTC NSAIDS and heat.   Neurovascular symptoms: no  Past Medical History:  Diagnosis Date   Arthritis    Atypical chest pain    Cholelithiasis 09/2015   determined by CT, asymptomatic   Coronary artery disease    Diverticulitis    Family history of heart disease    GERD (gastroesophageal reflux disease)    diet related   Hyperlipidemia    Seasonal allergies     Objective:  VITAL SIGNS: BP 120/82   Pulse 72   Temp 97.7 F (36.5 C) (Oral)   Ht 6\' 5"  (1.956 m)   Wt (!) 340 lb (154.2 kg)   SpO2 98%   BMI 40.32 kg/m  Constitutional: Well formed, well developed. No acute distress. HENT: Normocephalic, atraumatic.  Thorax & Lungs:  No accessory muscle use Musculoskeletal: low back.   Tenderness to palpation: yes over lower thor and upper lumbar on R Deformity: no Ecchymosis: no Straight leg test: negative for Poor hamstring flexibility b/l. Neurologic: Normal sensory function. No focal deficits noted. DTR's equal and symmetric in LE's. No clonus. Psychiatric: Normal mood. Age appropriate judgment and insight. Alert & oriented x 3.    Assessment:  Acute right-sided low back pain without sciatica - Plan: meloxicam (MOBIC) 15 MG tablet, tiZANidine (ZANAFLEX) 4 MG tablet  Plan: Stretches/exercises, heat, ice, Tylenol, NSAIDs. Consider PT if no better, very tight hamstrings.  F/u prn. The patient voiced understanding and agreement to the  plan.   [place], DO 02/18/21  10:33 AM

## 2021-02-23 ENCOUNTER — Other Ambulatory Visit: Payer: Self-pay | Admitting: Cardiovascular Disease

## 2021-03-22 ENCOUNTER — Encounter: Payer: 59 | Admitting: Internal Medicine

## 2021-03-23 ENCOUNTER — Other Ambulatory Visit: Payer: Self-pay | Admitting: Cardiovascular Disease

## 2021-04-20 ENCOUNTER — Encounter: Payer: 59 | Admitting: Internal Medicine

## 2021-04-22 ENCOUNTER — Encounter: Payer: Self-pay | Admitting: Internal Medicine

## 2021-04-22 ENCOUNTER — Ambulatory Visit (INDEPENDENT_AMBULATORY_CARE_PROVIDER_SITE_OTHER): Payer: 59 | Admitting: Internal Medicine

## 2021-04-22 VITALS — BP 126/80 | HR 84 | Temp 98.3°F | Resp 18 | Ht 77.0 in | Wt 338.1 lb

## 2021-04-22 DIAGNOSIS — R202 Paresthesia of skin: Secondary | ICD-10-CM

## 2021-04-22 DIAGNOSIS — Z0000 Encounter for general adult medical examination without abnormal findings: Secondary | ICD-10-CM

## 2021-04-22 DIAGNOSIS — K915 Postcholecystectomy syndrome: Secondary | ICD-10-CM

## 2021-04-22 DIAGNOSIS — Z0001 Encounter for general adult medical examination with abnormal findings: Secondary | ICD-10-CM

## 2021-04-22 DIAGNOSIS — R739 Hyperglycemia, unspecified: Secondary | ICD-10-CM

## 2021-04-22 DIAGNOSIS — E782 Mixed hyperlipidemia: Secondary | ICD-10-CM

## 2021-04-22 MED ORDER — CHOLESTYRAMINE 4 G PO PACK: 4.0000 g | PACK | Freq: Every day | ORAL | 3 refills | Status: DC

## 2021-04-22 MED ORDER — SILDENAFIL CITRATE 20 MG PO TABS: 60.0000 mg | ORAL_TABLET | Freq: Every evening | ORAL | 3 refills | Status: AC | PRN

## 2021-04-22 NOTE — Progress Notes (Signed)
Subjective:    Patient ID: Dennis Castro., male    DOB: [birth year], 61 y.o.   MRN: [id number]  DOS:  04/22/2021 Type of visit - description: CPX  In addition to CPX, we addressed a number of other issues Morbid obesity: Unable to lose much weight despite trying to walk and eat healthier. Continue with GI symptoms since cholecystectomy 2020, mostly loose stool, incomplete bowel emptying feeling, symptoms somewhat better with cholestyramine.  Denies nausea or vomiting.  No blood in the stools other than occasional red blood drops in the tissue paper.  Has burning feeling at both plantar areas, this is going on for about a year, worse at nighttime. No chronic back pain however he was seen by one of my partners few weeks ago with acute low back pain, that is better.  ED: RF sildenafil  Wt Readings from Last 3 Encounters:  04/22/21 (!) 338 lb 2 oz (153.4 kg)  02/18/21 (!) 340 lb (154.2 kg)  09/28/20 (!) 331 lb 2 oz (150.2 kg)     Review of Systems  Other than above, a 14 point review of systems is negative     Past Medical History:  Diagnosis Date   Arthritis    Atypical chest pain    Cholelithiasis 09/2015   determined by CT, asymptomatic   Coronary artery disease    Diverticulitis    Family history of heart disease    GERD (gastroesophageal reflux disease)    diet related   Hyperlipidemia    Seasonal allergies     Past Surgical History:  Procedure Laterality Date   CARDIAC CATHETERIZATION  2010   Dr. Allyson Sabal; less than 30% occlusive disease   CHOLECYSTECTOMY N/A 06/14/2018   Procedure: LAPAROSCOPIC CHOLECYSTECTOMY;  Surgeon: Axel Filler, MD;  Location: [facility] OR;  Service: General;  Laterality: N/A;   INSERTION OF MESH  12/13/2018   Procedure: Insertion Of Mesh;  Surgeon: Axel Filler, MD;  Location: [facility] OR;  Service: General;;   TONSILLECTOMY     age 8   UMBILICAL HERNIA REPAIR N/A 12/13/2018   Procedure: LAPAROSCOPIC UMBILICAL HERNIA REPAIR;  Surgeon: Axel Filler, MD;  Location: [facility] OR;  Service: General;  Laterality: N/A;   Social History   Socioeconomic History   Marital status: Married    Spouse name: Not on file   Number of children: 1   Years of education: Not on file   Highest education level: Not on file  Occupational History   Occupation: Production designer, theatre/television/film, office job  Tobacco Use   Smoking status: Never   Smokeless tobacco: Never  Vaping Use   Vaping Use: Never used  Substance and Sexual Activity   Alcohol use: No   Drug use: No   Sexual activity: Yes  Other Topics Concern   Not on file  Social History Narrative   Teenage son [name], still at home   Social Determinants of Health   Financial Resource Strain: Not on file  Food Insecurity: Not on file  Transportation Needs: Not on file  Physical Activity: Not on file  Stress: Not on file  Social Connections: Not on file  Intimate Partner Violence: Not on file    Current Outpatient Medications  Medication Instructions   aspirin EC 81 mg, Oral, Daily   atorvastatin (LIPITOR) 20 mg, Oral, Daily at bedtime, Patient needs to scheduled an office visit for further refills.   cholestyramine (QUESTRAN) 4 g, Oral, Daily   meloxicam (MOBIC) 15 mg, Oral, Daily   Multiple  Vitamin (MULTIVITAMIN WITH MINERALS) TABS tablet 1 tablet, Oral, Daily   sildenafil (REVATIO) 60-80 mg, Oral, At bedtime PRN   tiZANidine (ZANAFLEX) 4 mg, Oral, Every 6 hours PRN       Objective:   Physical Exam BP 126/80 (BP Location: Left Arm, Patient Position: Sitting, Cuff Size: Normal)    Pulse 84    Temp 98.3 F (36.8 C) (Oral)    Resp 18    Ht 6\' 5"  (1.956 m)    Wt (!) 338 lb 2 oz (153.4 kg)    SpO2 95%    BMI 40.10 kg/m  General: Well developed, NAD, BMI noted Neck: No  thyromegaly  HEENT:  Normocephalic . Face symmetric, atraumatic Lungs:  CTA B Normal respiratory effort, no intercostal retractions, no accessory muscle use. Heart: RRR,  no murmur.  Abdomen:  Not distended, soft, non-tender. No  rebound or rigidity.   Lower extremities: no pretibial edema bilaterally.  Good pedal pulses Skin: Exposed areas without rash. Not pale. Not jaundice Neurologic:  alert & oriented X3.  Speech normal, gait appropriate for age and unassisted Strength symmetric and appropriate for age. Pinprick examination: Normal bilaterally. Psych: Cognition and judgment appear intact.  Cooperative with normal attention span and concentration.  Behavior appropriate. No anxious or depressed appearing.     Assessment    Assessment   Hyperlipidemia   GERD Obesity , Morbid  CAD:  --Chest pain: Myoview stress test subtle anterior ischemia --Cardiac cath 06-24-09: Noncritical 30% LAD with subtle anteroapical HK. Diverticulitis Seasonal allergies +FH prostate ca (father age 12) +FH CAD (mother age 4) kidney  Stones  PLAN: Here for CPX Hyperlipidemia: On Lipitor, history of CAD, checking labs Obesity, morbid: Unable to lose weight as much as he would like to, unable to exercise much due to Planter fasciitis.  Recommend to see the wellness clinic.  Continue watching diet, switch exercises to stationary bike?[name]  Use shoe inserts to help Planter fasciitis. Postcholecystectomy syndrome?  Ongoing GI symptoms since he had a gallbladder removed 06-2018.  Refer to GI. Lower extremity paresthesias: Going on for a year, recommend neuro eval.  We will check A1c (history of mild hyperglycemia) B12, folic acid, vitamin D, TSH. RTC 4 months   In addition to CPX I addressed hid chronid med problems and a new issues (paresthesias)   This visit occurred during the SARS-CoV-2 public health emergency.  Safety protocols were in place, including screening questions prior to the visit, additional usage of staff PPE, and extensive cleaning of exam room while observing appropriate contact time as indicated for disinfecting solutions.

## 2021-04-22 NOTE — Patient Instructions (Addendum)
Please see the wellness clinic to help you with weight loss  Consider a stationary bike and uses shoe inserts to help with Planter fasciitis  We are referring you to gastroenterology  We are referring you to neurology    GO TO THE LAB : Get the blood work     GO TO THE FRONT DESK, PLEASE SCHEDULE YOUR APPOINTMENTS Come back for a checkup in 4 months     "Living will", "Health Care Power of attorney": Advanced care planning  (If you already have a living will or healthcare power of attorney, please bring the copy to be scanned in your chart.)  Advance care planning is a process that supports adults in  understanding and sharing their preferences regarding future medical care.   The patient's preferences are recorded in documents called Advance Directives.    Advanced directives are completed (and can be modified at any time) while the patient is in full mental capacity.   The documentation should be available at all times to the patient, the family and the healthcare providers.  Bring in a copy to be scanned in your chart is an excellent idea and is recommended   This legal documents direct treatment decision making and/or appoint a surrogate to make the decision if the patient is not capable to do so.    Advance directives can be documented in many types of formats,  documents have names such as:  Lliving will  Durable power of attorney for healthcare (healthcare proxy or healthcare power of attorney)  Combined directives  Physician orders for life-sustaining treatment    More information at:  [email or web address]

## 2021-04-23 ENCOUNTER — Other Ambulatory Visit: Payer: Self-pay | Admitting: Cardiovascular Disease

## 2021-04-24 ENCOUNTER — Encounter: Payer: Self-pay | Admitting: Internal Medicine

## 2021-04-24 DIAGNOSIS — K915 Postcholecystectomy syndrome: Secondary | ICD-10-CM | POA: Insufficient documentation

## 2021-04-24 NOTE — Assessment & Plan Note (Signed)
Here for CPX Hyperlipidemia: On Lipitor, history of CAD, checking labs Obesity, morbid: Unable to lose weight as much as he would like to, unable to exercise much due to Planter fasciitis.  Recommend to see the wellness clinic.  Continue watching diet, switch exercises to stationary bike?Dennis Castro  Use shoe inserts to help Planter fasciitis. Postcholecystectomy syndrome?  Ongoing GI symptoms since he had a gallbladder removed 06-2018.  Refer to GI. Lower extremity paresthesias: Going on for a year, recommend neuro eval.  We will check A1c (history of mild hyperglycemia) B12, folic acid, vitamin D, TSH. RTC 4 months

## 2021-04-24 NOTE — Assessment & Plan Note (Signed)
-  Td  2016 - shingrix x2 -COVID vaccine booster rec -  had a flu shot  - CCS: Colonoscopy-2014: Diverticuli, no polyps.  Per genetic counseling evaluation, was rec to repeat a colonoscopy, done 02/21/2019.  Next 5 years per Cscope report. - Prostate cancer  screening: Last DRE a year ago.  Check PSA - Diet, exercise: Trying to do better, see comments under Morb Obesity >>> rec  to see the wellness clinic. -Labs: CMP, FLP, CBC, A1c, TSH, B12, folic acid, vitamin D - ACP info provided.

## 2021-04-26 LAB — COMPREHENSIVE METABOLIC PANEL
AG Ratio: 1.8 (calc) (ref 1.0–2.5)
ALT: 33 U/L (ref 9–46)
AST: 19 U/L (ref 10–35)
Albumin: 4.2 g/dL (ref 3.6–5.1)
Alkaline phosphatase (APISO): 76 U/L (ref 35–144)
BUN: 17 mg/dL (ref 7–25)
CO2: 28 mmol/L (ref 20–32)
Calcium: 9.5 mg/dL (ref 8.6–10.3)
Chloride: 106 mmol/L (ref 98–110)
Creat: 1.07 mg/dL (ref 0.70–1.30)
Globulin: 2.4 g/dL (calc) (ref 1.9–3.7)
Glucose, Bld: 120 mg/dL — ABNORMAL HIGH (ref 65–99)
Potassium: 4.2 mmol/L (ref 3.5–5.3)
Sodium: 141 mmol/L (ref 135–146)
Total Bilirubin: 0.5 mg/dL (ref 0.2–1.2)
Total Protein: 6.6 g/dL (ref 6.1–8.1)

## 2021-04-26 LAB — LIPID PANEL
Cholesterol: 115 mg/dL (ref <200)
HDL: 37 mg/dL — ABNORMAL LOW (ref >=40)
LDL Cholesterol (Calc): 49 mg/dL (calc)
Non-HDL Cholesterol (Calc): 78 mg/dL (calc) (ref <130)
Total CHOL/HDL Ratio: 3.1 (calc) (ref <5.0)
Triglycerides: 220 mg/dL — ABNORMAL HIGH (ref <150)

## 2021-04-26 LAB — HEMOGLOBIN A1C
Hgb A1c MFr Bld: 5.4 % of total Hgb (ref <5.7)
Mean Plasma Glucose: 108 mg/dL
eAG (mmol/L): 6 mmol/L

## 2021-04-26 LAB — TSH: TSH: 1.76 mIU/L (ref 0.40–4.50)

## 2021-04-26 LAB — SEDIMENTATION RATE

## 2021-04-26 LAB — VITAMIN D 25 HYDROXY (VIT D DEFICIENCY, FRACTURES): Vit D, 25-Hydroxy: 38 ng/mL (ref 30–100)

## 2021-04-26 LAB — PSA: PSA: 1.23 ng/mL (ref <=4.00)

## 2021-04-26 LAB — B12 AND FOLATE PANEL
Folate: 13.8 ng/mL
Vitamin B-12: 337 pg/mL (ref 200–1100)

## 2021-05-23 ENCOUNTER — Other Ambulatory Visit: Payer: Self-pay | Admitting: Cardiovascular Disease

## 2021-05-25 ENCOUNTER — Telehealth: Payer: Self-pay | Admitting: Cardiovascular Disease

## 2021-05-25 MED ORDER — ATORVASTATIN CALCIUM 20 MG PO TABS: 20.0000 mg | ORAL_TABLET | Freq: Every day | ORAL | 2 refills | Status: DC

## 2021-05-25 NOTE — Telephone Encounter (Signed)
°*  STAT* If patient is at the pharmacy, call can be transferred to refill team.   1. Which medications need to be refilled? (please list name of each medication and dose if known) Atorvastatin   2. Which pharmacy/location (including street and city if local pharmacy) is medication to be sent to?[facility], [place]  3. Do they need a 30 day or 90 day supply? Enough his first available appointment on 07-19-21 with Verdon Cummins

## 2021-05-25 NOTE — Telephone Encounter (Signed)
Refills has been sent to the pharmacy. 

## 2021-06-01 ENCOUNTER — Encounter: Payer: Self-pay | Admitting: Gastroenterology

## 2021-06-01 ENCOUNTER — Ambulatory Visit: Payer: 59 | Admitting: Gastroenterology

## 2021-06-01 VITALS — BP 128/74 | HR 75 | Ht 77.0 in | Wt 333.0 lb

## 2021-06-01 DIAGNOSIS — Z1589 Genetic susceptibility to other disease: Secondary | ICD-10-CM | POA: Diagnosis not present

## 2021-06-01 DIAGNOSIS — R194 Change in bowel habit: Secondary | ICD-10-CM | POA: Diagnosis not present

## 2021-06-01 DIAGNOSIS — K915 Postcholecystectomy syndrome: Secondary | ICD-10-CM | POA: Diagnosis not present

## 2021-06-01 MED ORDER — CITRUCEL PO POWD: ORAL | Status: AC

## 2021-06-01 MED ORDER — COLESTIPOL HCL 1 G PO TABS: 1.0000 g | ORAL_TABLET | Freq: Two times a day (BID) | ORAL | 3 refills | Status: DC

## 2021-06-01 NOTE — Progress Notes (Signed)
? ?HPI :  ?61 year old male here for a follow-up visit.  I know him from history of colonoscopies, last done November 2020. ? ?He states he had his gallbladder out March 2020.  Since that time he has had intermittent loose stools.  Tends to be associated with what he is eating, heavier/greasy foods can lead to loose stools.  He can often have this but its not every day.  He works from home a lot so it is normally not a big deal if he needs to use the bathroom more frequently, however he does go into his office at some times and this can be more bothersome for him.  He has used cholestyramine as needed for the symptoms and he states it does work well to reduce his loose stools.  He does also have a sense of incomplete evacuation at times.  He does not have any abdominal pains.  No blood in his stools.  He denies any routine NSAID use.  His last colonoscopy in November 2020 did not show any inflammatory changes or abnormalities.  He does have a history of MUTYK mutation - heterozygous. ? ?He otherwise has a history of GERD intermittently in the past.  Had an EGD in 2017 which showed no signs of Barrett's.  Currently not taking anything for this as it is not really bothering him. ? ?  ?Colonoscopy 05/24/2012 - left sided diverticulosis, no polyps ?  ? ?EGD 12/07/2015 -  ?- The exam of the esophagus was otherwise normal. No evidence of Barrett's esophagus or ?esophagitis was appreciated. ?- The entire examined stomach was normal. 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 Biopsies were taken with a cold forceps for histology to ?ensure no adenomatous changes. ?- The exam of the duodenum was otherwise normal. ? ?Diagnosis ?Surgical [P], small bowel ?- BENIGN GASTRIC MUCOSA WITH ADJACENT BENIGN SMALL BOWEL MUCOSA, CONSISTENT WITH ECTOPIC GASTRIC ?MUCOSA. ?- NO DYSPLASIA OR MALIGNANCY ? ? ?Colonoscopy 02/21/2019:The perianal and digital rectal  examinations were normal. ?- Multiple medium-mouthed diverticula were found in the sigmoid colon, descending colon and ?transverse colon. ?- Internal hemorrhoids were found during retroflexion. The hemorrhoids were small. ?- The exam was otherwise without abnormality. ? ?Told to repeat colon in 5 years ? ? ?Past Medical History:  ?Diagnosis Date  ? Arthritis   ? Atypical chest pain   ? Cholelithiasis 09/2015  ? determined by CT, asymptomatic  ? Coronary artery disease   ? Diverticulitis   ? Family history of heart disease   ? GERD (gastroesophageal reflux disease)   ? diet related  ? Hyperlipidemia   ? Seasonal allergies   ? ? ? ?Past Surgical History:  ?Procedure Laterality Date  ? CARDIAC CATHETERIZATION  2010  ? Dr. Allyson Sabal; less than 30% occlusive disease  ? CHOLECYSTECTOMY N/A 06/14/2018  ? Procedure: LAPAROSCOPIC CHOLECYSTECTOMY;  Surgeon: Axel Filler, MD;  Location: [facility] OR;  Service: General;  Laterality: N/A;  ? INSERTION OF MESH  12/13/2018  ? Procedure: Insertion Of Mesh;  Surgeon: Axel Filler, MD;  Location: [facility] OR;  Service: General;;  ? TONSILLECTOMY    ? age 68  ? UMBILICAL HERNIA REPAIR N/A 12/13/2018  ? Procedure: LAPAROSCOPIC UMBILICAL HERNIA REPAIR;  Surgeon: Axel Filler, MD;  Location: [facility] OR;  Service: General;  Laterality: N/A;  ? ?Family History  ?Problem Relation Age of Onset  ? Diabetes Mother 96  ? CAD Mother 40  ?  CBAG  ? Colon cancer Mother   ? Cancer Father   ?     prostate cancer and panceatic cancer  ? Prostate cancer Father   ? Breast cancer Maternal Grandmother   ? Pancreatic cancer Paternal Grandfather   ? Stroke Neg Hx   ? Esophageal cancer Neg Hx   ? Rectal cancer Neg Hx   ? Stomach cancer Neg Hx   ? ?Social History  ? ?Tobacco Use  ? Smoking status: Never  ? Smokeless tobacco: Never  ?Vaping Use  ? Vaping Use: Never used  ?Substance Use Topics  ? Alcohol use: No  ? Drug use: No  ? ?Current Outpatient Medications  ?Medication Sig Dispense Refill  ? aspirin EC 81 MG tablet  Take 81 mg by mouth daily.    ? atorvastatin (LIPITOR) 20 MG tablet Take 1 tablet (20 mg total) by mouth at bedtime. KEEP OV. 30 tablet 2  ? cholestyramine (QUESTRAN) 4 g packet Take 1 packet (4 g total) by mouth daily. 30 packet 3  ? meloxicam (MOBIC) 15 MG tablet Take 1 tablet (15 mg total) by mouth daily. 30 tablet 0  ? Multiple Vitamin (MULTIVITAMIN WITH MINERALS) TABS tablet Take 1 tablet by mouth daily.    ? sildenafil (REVATIO) 20 MG tablet Take 3-4 tablets (60-80 mg total) by mouth at bedtime as needed. 30 tablet 3  ? tiZANidine (ZANAFLEX) 4 MG tablet Take 1 tablet (4 mg total) by mouth every 6 (six) hours as needed for muscle spasms. 20 tablet 0  ? ?No current facility-administered medications for this visit.  ? ?No Known Allergies ? ? ?Review of Systems: ?All systems reviewed and negative except where noted in HPI.  ? ? ?Lab Results  ?Component Value Date  ? WBC 8.6 04/24/2020  ? HGB 14.1 04/24/2020  ? HCT 42.0 04/24/2020  ? MCV 86.6 04/24/2020  ? PLT 263 04/24/2020  ? ? ?Lab Results  ?Component Value Date  ? CREATININE 1.07 04/22/2021  ? BUN 17 04/22/2021  ? NA 141 04/22/2021  ? K 4.2 04/22/2021  ? CL 106 04/22/2021  ? CO2 28 04/22/2021  ? ? ?Lab Results  ?Component Value Date  ? ALT 33 04/22/2021  ? AST 19 04/22/2021  ? ALKPHOS 77 07/09/2019  ? BILITOT 0.5 04/22/2021  ? ? ? ?Physical Exam: ?BP 128/74   Pulse 75   Ht 6\' 5"  (1.956 m)   Wt (!) 333 lb (151 kg)   BMI 39.49 kg/m?  ?Constitutional: Pleasant,well-developed, male in no acute distress. ?Neurological: Alert and oriented to person place and time. ?Psychiatric: Normal mood and affect. Behavior is normal. ? ? ?ASSESSMENT AND PLAN: ?61 year old male here for reassessment the following: ? ?Postcholecystectomy syndrome ?Altered bowel habits ?MUTYH heterozygous mutation ? ?Patient has had postprandial loose stools with urgency since his gallbladder was removed a few years ago.  Fairly recent colonoscopy did not show any concerning pathology.  He has  responded appropriately to colestyramine however not using it routinely.  We discussed options, really depends on how much this bothers him.  It is not so convenient bringing cholestyramine into work and trying to mix it with a drink, I offered him Colestid tablets 1 g twice daily as needed if this will be much easier for him.  If he has more loose stools than not he should use the Colestid more regularly.  If he still has sense of incomplete evacuation despite this he can add Citrucel once daily.  We discussed risks of  chronic cholestyramine or Colestid which can include increased triglyceride levels and his levels are slightly elevated.  He is seeing his cardiologist next month and can discuss his triglyceride levels with them at that time.  Otherwise due to his heterozygous MUTYH mutation he is undergoing colonoscopy every 5 years.  He can contact me if no improvement in symptoms. ? ?Plan: ?- trial of Colestid 1gm BID PRN, stop cholestyramine ?- add Citrucel once daily if needed ?- discuss triglycerides with his cardiologist next month ?- colonoscopy every 5 years ? ?Harlin Rain, MD ?[facility] ? ?

## 2021-06-01 NOTE — Patient Instructions (Addendum)
If you are age 60 or older, your body mass index should be between 23-30. Your Body mass index is 39.49 kg/m?[name] If this is out of the aforementioned range listed, please consider follow up with your Primary Care Provider. ? ?If you are age 34 or younger, your body mass index should be between 19-25. Your Body mass index is 39.49 kg/m?[name] If this is out of the aformentioned range listed, please consider follow up with your Primary Care Provider.  ? ?________________________________________________________ ? ?The [facility] providers would like to encourage you to use [facility] to communicate with providers for non-urgent requests or questions.  Due to long hold times on the telephone, sending your provider a message by [facility] may be a faster and more efficient way to get a response.  Please allow 48 business hours for a response.  Please remember that this is for non-urgent requests.  ?_______________________________________________________ ? ?We have sent the following medications to your pharmacy for you to pick up at your convenience: ?Colestid 1 g: Take twice daily as needed ?(Hold Cholestyramine while taking). ? ?Add over-the-counter Citrucel once daily if needed. ? ?Thank you for entrusting me with your care and for choosing [facility], ?Dr. Ileene Patrick ? ? ?

## 2021-06-13 ENCOUNTER — Encounter: Payer: Self-pay | Admitting: Neurology

## 2021-06-13 ENCOUNTER — Ambulatory Visit: Payer: 59 | Admitting: Neurology

## 2021-06-13 VITALS — BP 119/79 | HR 70 | Ht 77.0 in | Wt 335.8 lb

## 2021-06-13 DIAGNOSIS — R208 Other disturbances of skin sensation: Secondary | ICD-10-CM

## 2021-06-13 NOTE — Patient Instructions (Addendum)
61 y.o. male here as a referral from Dr. Drue Novel for peripheral neuropathy. He reports slowly progressive burning, tingling  bottom of feet and great toe started in the past year ago. Discussed the causes of peripheral neuropathy, the most common being diabetes/pre-diabetes even hyperglycemia. More than 20 million people in the [place] states have some form of peripheral neuropathy. This is a condition that develops as a result of damage to the peripheral nervous system. 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 There are multiple causes eluding metabolic, toxic, infectious and endocrine disorders, small vessel disease, autoimmune diseases, and others. We'll perform additional serum neuropathy screening and monitor clinically. Discussed in detail with patient.   - Weight Loss: Normoglycemic obese patients have a high prevalence of neuropathy indicating that obesity alone may be sufficient to cause neuropathy. Waist circumference, but not general obesity, is significantly associated with neuropathy.1  Central Obesity is Associated With Neuropathy in the Severely Obese [place] 1, [place] 2, [place] 2, [place] 2, [place] 2, [place] 2 [facility])  - he has been hyperglycemic with elevated triglycerides which may indicate pre-diabetes, even though his HgbA1c is only 5.4 he may still be pre-diabetic and long-standing hyperglycemia could cause distal polyneuropathy. Dr. Drue Novel could perform a 3-hour glucose tolerance test if clinically warranted.   - slight peripheral edema, distally feet feel cold to the touch, nontender, distal slight rubor in the lower legs: May suggest Dr. Drue Novel check vascular system.   - If progresses will order EMG/NCS  - -May consider daily alpha  lipoic acid which is an antioxidant that may reduce free radical oxidative stress associated with diabetic polyneuropathy, 400-600 once to twice a day; existing evidence suggests that alpha lipoic acid significantly reduces stabbing, lancinating and burning pain in diabetic neuropathy with its onset of action as early as 1-2 weeks or may take months  Peripheral Neuropathy Peripheral neuropathy is a type of nerve damage. It affects nerves that carry signals between the spinal cord and the arms, legs, and the rest of the body (peripheral nerves). It does not affect nerves in the spinal cord or brain. In peripheral neuropathy, one nerve or a group of nerves may be damaged. Peripheral neuropathy is a broad category that includes many specific nerve disorders, like diabetic neuropathy, hereditary neuropathy, and carpal tunnel syndrome. What are the causes? This condition may be caused by: Diabetes. This is the most common cause of peripheral neuropathy. Nerve injury. Pressure or stress on a nerve that lasts a long time. Lack (deficiency) of B vitamins. This can result from alcoholism, poor diet, or a restricted diet. Infections. Autoimmune diseases, such as rheumatoid arthritis and systemic lupus erythematosus. Nerve diseases that are passed from parent to child (inherited). Some medicines, such as cancer medicines (chemotherapy). Poisonous (toxic) substances, such as lead and mercury. Too little blood flowing to the legs. Kidney disease. Thyroid disease. Central obesity In some cases, the cause of this condition is not known. What are the signs or symptoms? Symptoms of this condition depend on which of your nerves is damaged. Common symptoms include: Loss of feeling (numbness) in the feet, hands, or both. Tingling in the feet, hands, or both. Burning pain. Very sensitive skin. Weakness. Not being able to move a part of the body (paralysis). Muscle twitching. Clumsiness or poor  coordination. Loss  of balance. Not being able to control your bladder. Feeling dizzy. Sexual problems. How is this diagnosed? Diagnosing and finding the cause of peripheral neuropathy can be difficult. Your health care provider will take your medical history and do a physical exam. A neurological exam will also be done. This involves checking things that are affected by your brain, spinal cord, and nerves (nervous system). For example, your health care provider will check your reflexes, how you move, and what you can feel. You may have other tests, such as: Blood tests. Electromyogram (EMG) and nerve conduction tests. These tests check nerve function and how well the nerves are controlling the muscles. Imaging tests, such as CT scans or MRI to rule out other causes of your symptoms. Removing a small piece of nerve to be examined in a lab (nerve biopsy). Removing and examining a small amount of the fluid that surrounds the brain and spinal cord (lumbar puncture). How is this treated? Treatment for this condition may involve: Treating the underlying cause of the neuropathy, such as diabetes, kidney disease, or vitamin deficiencies. Stopping medicines that can cause neuropathy, such as chemotherapy. Medicine to help relieve pain. Medicines may include: Prescription or over-the-counter pain medicine. Antiseizure medicine. Antidepressants. Pain-relieving patches that are applied to painful areas of skin. Surgery to relieve pressure on a nerve or to destroy a nerve that is causing pain. Physical therapy to help improve movement and balance. Devices to help you move around (assistive devices). Follow these instructions at home: Medicines Take over-the-counter and prescription medicines only as told by your health care provider. Do not take any other medicines without first asking your health care provider. Do not drive or use heavy machinery while taking prescription pain  medicine. Lifestyle  Do not use any products that contain nicotine or tobacco, such as cigarettes and e-cigarettes. Smoking keeps blood from reaching damaged nerves. If you need help quitting, ask your health care provider. Avoid or limit alcohol. Too much alcohol can cause a vitamin B deficiency, and vitamin B is needed for healthy nerves. Eat a healthy diet. This includes: Eating foods that are high in fiber, such as fresh fruits and vegetables, whole grains, and beans. Limiting foods that are high in fat and processed sugars, such as fried or sweet foods. General instructions  If you have diabetes, work closely with your health care provider to keep your blood sugar under control. If you have numbness in your feet: Check every day for signs of injury or infection. Watch for redness, warmth, and swelling. Wear padded socks and comfortable shoes. These help protect your feet. Develop a good support system. Living with peripheral neuropathy can be stressful. Consider talking with a mental health specialist or joining a support group. Use assistive devices and attend physical therapy as told by your health care provider. This may include using a walker or a cane. Keep all follow-up visits as told by your health care provider. This is important. Contact a health care provider if: You have new signs or symptoms of peripheral neuropathy. You are struggling emotionally from dealing with peripheral neuropathy. Your pain is not well-controlled. Get help right away if: You have an injury or infection that is not healing normally. You develop new weakness in an arm or leg. You have fallen or do so frequently. Summary Peripheral neuropathy is when the nerves in the arms, or legs are damaged, resulting in numbness, weakness, or pain. There are many causes of peripheral neuropathy, including diabetes, pinched nerves, vitamin deficiencies,  autoimmune disease, and hereditary conditions. Diagnosing and  finding the cause of peripheral neuropathy can be difficult. Your health care provider will take your medical history, do a physical exam, and do tests, including blood tests and nerve function tests. Treatment involves treating the underlying cause of the neuropathy and taking medicines to help control pain. Physical therapy and assistive devices may also help. This information is not intended to replace advice given to you by your health care provider. Make sure you discuss any questions you have with your health care provider. Document Revised: 12/30/2019 Document Reviewed: 12/30/2019 Elsevier Patient Education  2022 [employer].

## 2021-06-13 NOTE — Progress Notes (Signed)
[facility]    Provider:  Dr Jaynee Eagles Requesting Provider: Colon Branch, MD Primary Care Provider:  Colon Branch, MD  CC:  burning on bottom of fet  HPI:  Dennis Castro. is a 61 y.o. male here as requested by Colon Branch, MD for lower extremity paresthesias. PMHx CAD, HLD, morbid obesity, ED, anxiety, arthritis, mild hyperglycemia.  I reviewed Dr. Ethel Rana notes: He has morbid obesity, this is an independent risk factor for neuropathy, he has burning feet at both plantar areas, going on for about a year, worse at night, he was seen with acute low back pain recently but that is better.  He started walking last summer and started noticing issues with his feet not necessarily sensory changes. When he would walk they hurt, he had plantar fasciitis, at some point he noticed burning in the feet gradually worsening. It burns, tingles, from the mid to forefoot. Notices it more at night, noticing some more during the day. Walking makes it worse, laying in bed feel it more. No weakness. He has had some low back pain recently. He generally has pain in his low back. No imbalance. No cramping in the feet. In the toes. No alcohol use. No new medications since this started. Mother had diabetes and neuropathy. Symmetrical. No other focal neurologic deficits, associated symptoms, inciting events or modifiable factors.  Reviewed notes, labs and imaging from outside physicians, which showed:  04/22/2021: Vit D 38, tSH nml, hgba1c 5.4, B12 337   Review of Systems: Patient complains of symptoms per HPI as well as the following symptoms burning. Pertinent negatives and positives per HPI. All others negative.   Social History   Socioeconomic History   Marital status: Married    Spouse name: Not on file   Number of children: 1   Years of education: Not on file   Highest education level: Not on file  Occupational History   Occupation: Freight forwarder, office job  Tobacco Use   Smoking status: Never    Smokeless tobacco: Never  Vaping Use   Vaping Use: Never used  Substance and Sexual Activity   Alcohol use: No   Drug use: No   Sexual activity: Yes  Other Topics Concern   Not on file  Social History Narrative   Teenage son [name], still at home   Social Determinants of Health   Financial Resource Strain: Not on file  Food Insecurity: Not on file  Transportation Needs: Not on file  Physical Activity: Not on file  Stress: Not on file  Social Connections: Not on file  Intimate Partner Violence: Not on file    Family History  Problem Relation Age of Onset   Diabetes Mother 40   CAD Mother 85       CBAG   Colon cancer Mother    Cancer Father        prostate cancer and panceatic cancer   Prostate cancer Father    Breast cancer Maternal Grandmother    Pancreatic cancer Paternal Grandfather    Stroke Neg Hx    Esophageal cancer Neg Hx    Rectal cancer Neg Hx    Stomach cancer Neg Hx    Neuropathy Neg Hx     Past Medical History:  Diagnosis Date   Arthritis    Atypical chest pain    Cholelithiasis 09/2015   determined by CT, asymptomatic   Coronary artery disease    Diverticulitis    Family history of heart  disease    GERD (gastroesophageal reflux disease)    diet related   Hyperlipidemia    Seasonal allergies     Patient Active Problem List   Diagnosis Date Noted   Post-cholecystectomy syndrome 04/24/2021   Anxiety 07/10/2019   Family history of pancreatic cancer 01/15/2018   Coronary artery disease 08/10/2017   Left ankle pain 07/20/2017   Erectile dysfunction 03/21/2017   Morbid obesity ([place]) 07/20/2015   Annual physical exam 01/17/2015   PCP NOTES >>> 01/17/2015   Family history of prostate cancer 03/25/2012   GERD 04/01/2010   Hyperlipidemia 10/07/2007    Past Surgical History:  Procedure Laterality Date   CARDIAC CATHETERIZATION  2010   Dr. Gwenlyn Found; less than 30% occlusive disease   CHOLECYSTECTOMY N/A 06/14/2018   Procedure: LAPAROSCOPIC  CHOLECYSTECTOMY;  Surgeon: Ralene Ok, MD;  Location: [place];  Service: General;  Laterality: N/A;   INSERTION OF MESH  12/13/2018   Procedure: Insertion Of Mesh;  Surgeon: Ralene Ok, MD;  Location: [place];  Service: General;;   TONSILLECTOMY     age 41   UMBILICAL HERNIA REPAIR N/A 12/13/2018   Procedure: [place];  Surgeon: Ralene Ok, MD;  Location: [facility];  Service: General;  Laterality: N/A;    Current Outpatient Medications  Medication Sig Dispense Refill   aspirin EC 81 MG tablet Take 81 mg by mouth daily.     atorvastatin (LIPITOR) 20 MG tablet Take 1 tablet (20 mg total) by mouth at bedtime. KEEP OV. 30 tablet 2   colestipol (COLESTID) 1 g tablet Take 1 tablet (1 g total) by mouth 2 (two) times daily. 180 tablet 3   meloxicam (MOBIC) 15 MG tablet Take 1 tablet (15 mg total) by mouth daily. 30 tablet 0   methylcellulose (CITRUCEL) oral powder If needed     Multiple Vitamin (MULTIVITAMIN WITH MINERALS) TABS tablet Take 1 tablet by mouth daily.     sildenafil (REVATIO) 20 MG tablet Take 3-4 tablets (60-80 mg total) by mouth at bedtime as needed. 30 tablet 3   No current facility-administered medications for this visit.    Allergies as of 06/13/2021   (No Known Allergies)    Vitals: BP 119/79    Pulse 70    Ht 6' 5" (1.956 m)    Wt (!) 335 lb 12.8 oz (152.3 kg)    BMI 39.82 kg/m  Last Weight:  Wt Readings from Last 1 Encounters:  06/13/21 (!) 335 lb 12.8 oz (152.3 kg)   Last Height:   Ht Readings from Last 1 Encounters:  06/13/21 6' 5" (1.956 m)     Physical exam: Exam: Gen: NAD, conversant, well nourised, morbidly obese, well groomed                     CV: RRR, no MRG. No Carotid Bruits. slight peripheral edema, distally feet feel cold, nontender, distal slight rubor in the lower legs Eyes: Conjunctivae clear without exudates or hemorrhage  Neuro: Detailed Neurologic Exam  Speech:    Speech is normal; fluent and  spontaneous with normal comprehension.  Cognition:    The patient is oriented to person, place, and time;     recent and remote memory intact;     language fluent;     normal attention, concentration,     fund of knowledge Cranial Nerves:    The pupils are equal, round, and reactive to light. Attempted, pupils too small to visualize fundi. Visual fields are  full to finger confrontation. Extraocular movements are intact. Trigeminal sensation is intact and the muscles of mastication are normal. The face is symmetric. The palate elevates in the midline. Hearing intact. Voice is normal. Shoulder shrug is normal. The tongue has normal motion without fasciculations.   Coordination:    Normal   Gait:    Heel-toe and tandem gait are normal.   Motor Observation:    No asymmetry, no atrophy, and no involuntary movements noted. Tone:    Normal muscle tone.    Posture:    Posture is normal. normal erect    Strength:    Strength is V/V in the upper and lower limbs.      Sensation: decreased pin prick great toe, decreased temp in a distal to prox gradient, absent vibration at the great toes.      Reflex Exam:  DTR's:    Deep tendon reflexes in the upper and lower extremities are normal bilaterally.   Toes:    The toes are downgoing bilaterally.   Clonus:    Clonus is absent.    Assessment/Plan:  61 y.o. male here as a referral from Dr. Larose Kells for peripheral neuropathy. He reports slowly progressive burning, tingling  bottom of feet and great toe started in the past year ago. Discussed the causes of peripheral neuropathy, the most common being diabetes/pre-diabetes even hyperglycemia. More than 20 million people in the [place] states have some form of peripheral neuropathy. This is a condition that develops as a result of damage to the peripheral nervous system. Given his symptoms which are distal predominant, symmetrical, slowly progressive, and an ascending pattern with decreased sensation in  small and large fibers in a gradient fashion, suspect a symmetric length dependent neuropathy probably axonal.. There are multiple causes eluding metabolic, toxic, infectious and endocrine disorders, small vessel disease, autoimmune diseases, and others. We'll perform additional serum neuropathy screening and monitor clinically. Discussed in detail with patient.   - Weight Loss: Normoglycemic obese patients have a high prevalence of neuropathy indicating that obesity alone may be sufficient to cause neuropathy. Waist circumference, but not general obesity, is significantly associated with neuropathy.1. He is on the weight list for healthy weight center, encouraged him to go.  Central Obesity is Associated With Neuropathy in the Severely Obese [place] 1, [address] 2, [place] 2, [place] 2, [place] 2, [place] [place] 2 [facility])  - he has been hyperglycemic on labwork with elevated triglycerides which may indicate pre-diabetes. Even though his HgbA1c is only 5.4 he may still be pre-diabetic and even long-standing mild hyperglycemia could cause distal polyneuropathy. Dr. Larose Kells could perform a 3-hour glucose tolerance test if clinically warranted to see if he is pre-diabetic and consider treating.   - slight disyal peripheral edema, distally feet feel cold to the touch, has CAD, distal slight rubor in the lower legs: May suggest Dr. Larose Kells check vascular exam or ABI as clinically warranted.  - If progresses will order EMG/NCS and can consider genetic testing (mother had neuropathy)   - -May consider daily alpha lipoic acid which is an antioxidant that may reduce free radical oxidative stress associated with diabetic polyneuropathy, 400-600 once to twice a day; existing evidence suggests that alpha lipoic acid significantly reduces stabbing, lancinating and burning pain in diabetic neuropathy with its onset of action as early as 1-2 weeks or may take months  - B12 was 337, 10%  of people with B12 < 400 still have b12 deficiency. Will recheck  with an MMA and check some other bloodwork as well   Orders Placed This Encounter  Procedures   Vitamin B12   Methylmalonic acid, serum   Vitamin B1   Vitamin B6   Multiple Myeloma Panel (SPEP&IFE w/QIG)   No orders of the defined types were placed in this encounter.   Cc: Colon Branch, MD,  Colon Branch, MD  Sarina Ill, MD  [facility] [address] [place], [place] [address]  Phone [phone] Fax [phone]  I spent over 60 minutes of face-to-face and non-face-to-face time with patient on the  1. Burning sensation of feet    diagnosis.  This included previsit chart review, lab review, study review, order entry, electronic health record documentation, patient education on the different diagnostic and therapeutic options, counseling and coordination of care, risks and benefits of management, compliance, or risk factor reduction

## 2021-06-17 LAB — MULTIPLE MYELOMA PANEL, SERUM
Albumin SerPl Elph-Mcnc: 3.5 g/dL (ref 2.9–4.4)
Albumin/Glob SerPl: 1.2 (ref 0.7–1.7)
Alpha 1: 0.2 g/dL (ref 0.0–0.4)
Alpha2 Glob SerPl Elph-Mcnc: 0.8 g/dL (ref 0.4–1.0)
B-Globulin SerPl Elph-Mcnc: 1.1 g/dL (ref 0.7–1.3)
Gamma Glob SerPl Elph-Mcnc: 0.9 g/dL (ref 0.4–1.8)
Globulin, Total: 3 g/dL (ref 2.2–3.9)
IgA/Immunoglobulin A, Serum: 286 mg/dL (ref 90–386)
IgG (Immunoglobin G), Serum: 821 mg/dL (ref 603–1613)
IgM (Immunoglobulin M), Srm: 30 mg/dL (ref 20–172)
Total Protein: 6.5 g/dL (ref 6.0–8.5)

## 2021-06-17 LAB — METHYLMALONIC ACID, SERUM: Methylmalonic Acid: 257 nmol/L (ref 0–378)

## 2021-06-17 LAB — VITAMIN B6: Vitamin B6: 4.3 ug/L (ref 3.4–65.2)

## 2021-06-17 LAB — VITAMIN B1: Thiamine: 182.6 nmol/L (ref 66.5–200.0)

## 2021-06-17 LAB — VITAMIN B12: Vitamin B-12: 381 pg/mL (ref 232–1245)

## 2021-07-18 NOTE — Progress Notes (Signed)
? ? ?Office Visit  ?  ?Patient Name: Dennis Castro. ?Date of Encounter: 07/18/2021 ? ?Primary Care Provider:  Colon Branch, MD ?Primary Cardiologist:  None ?Primary Electrophysiologist: None ?Chief Complaint  ?  ?86-month follow-up for hyperlipidemia and CAD ? ? Patient Profile: ?Coronary artery disease ?HLD ?Morbid obesity ? ? Recent Studies: ? Myoview stress test: Revealed subtle anterior ischemia ? ?06/2009 LHC: Noncritical CAD with 30% hypodense segment and mid LAD with subtle anterior apical hypokinesis.  Medical therapy recommended ? ?History of Present Illness  ?  ?Dennis Castro. is a 61 y.o. male with PMH of CAD s/p LHC 06/2009 with findings of 30% hypotensive LAD stenosis, HLD, atypical chest pain, obesity.  Patient had coronary cath in 2011 for abnormal Myoview, patient treated with medical therapy for 30% hypodense segment and mid LAD stenosis.  Patient was doing well until 07/2017 when he presented to ER with complaint of atypical chest pain. EKG was reviewed with no acute changes and enzymes were unremarkable. ? ?He was last seen by Dr. Alvester Castro on 09/2019 for annual follow-up.  During visit patient denied chest pain or shortness of breath, with no recurrent atypical chest pain symptoms.  No medication changes were made at that time. ? ?Since last being seen in our clinic Mr. Dennis Castro reports that he has been feeling well. His only new complaint is some dyspnea with exertion while doing yard work.  He reports that he is not short of breath but just feels more fatigued than normal. He is currently walking 2-3 times a week for 45 minutes and denies any chest pain or shortness of breath with those activities. His exercise activities are also limited by plantar fasciitis and arthritishe denies chest pain, palpitations, PND, orthopnea, nausea, vomiting, dizziness, syncope, edema, weight gain, or early satiety.  He is compliant with his medications and reports no side effects. ? ? ?Past Medical History  ?   ?Past Medical History:  ?Diagnosis Date  ? Arthritis   ? Atypical chest pain   ? Cholelithiasis 09/2015  ? determined by CT, asymptomatic  ? Coronary artery disease   ? Diverticulitis   ? Family history of heart disease   ? GERD (gastroesophageal reflux disease)   ? diet related  ? Hyperlipidemia   ? Seasonal allergies   ? ?Past Surgical History:  ?Procedure Laterality Date  ? CARDIAC CATHETERIZATION  2010  ? Dr. Gwenlyn Castro; less than 30% occlusive disease  ? CHOLECYSTECTOMY N/A 06/14/2018  ? Procedure: LAPAROSCOPIC CHOLECYSTECTOMY;  Surgeon: Dennis Ok, MD;  Location: [facility] [facility];  Service: General;  Laterality: N/A;  ? INSERTION OF MESH  12/13/2018  ? Procedure: Insertion Of Mesh;  Surgeon: Dennis Ok, MD;  Location: [facility];  Service: General;;  ? TONSILLECTOMY    ? age 23  ? UMBILICAL HERNIA REPAIR N/A 12/13/2018  ? Procedure: LAPAROSCOPIC UMBILICAL HERNIA REPAIR;  Surgeon: Dennis Ok, MD;  Location: [facility] [facility];  Service: General;  Laterality: N/A;  ? ?Allergies ? ?No Known Allergies ? ?Home Medications  ?  ?Current Outpatient Medications  ?Medication Sig Dispense Refill  ? aspirin EC 81 MG tablet Take 81 mg by mouth daily.    ? atorvastatin (LIPITOR) 20 MG tablet Take 1 tablet (20 mg total) by mouth at bedtime. KEEP OV. 30 tablet 2  ? colestipol (COLESTID) 1 g tablet Take 1 tablet (1 g total) by mouth 2 (two) times daily. 180 tablet 3  ? meloxicam (MOBIC) 15 MG tablet Take 1 tablet (15  mg total) by mouth daily. 30 tablet 0  ? methylcellulose (CITRUCEL) oral powder If needed    ? Multiple Vitamin (MULTIVITAMIN WITH MINERALS) TABS tablet Take 1 tablet by mouth daily.    ? sildenafil (REVATIO) 20 MG tablet Take 3-4 tablets (60-80 mg total) by mouth at bedtime as needed. 30 tablet 3  ? ?No current facility-administered medications for this visit.  ?  ? ?Review of Systems  ?Please see the history of present illness.    ?(+) Arthritis in low back ?(+) Dyspnea on exertion ? ?All other systems reviewed and are otherwise  negative except as noted above. ? ?Physical Exam  ?  ?Wt Readings from Last 3 Encounters:  ?06/13/21 (!) 335 lb 12.8 oz (152.3 kg)  ?06/01/21 (!) 333 lb (151 kg)  ?04/22/21 (!) 338 lb 2 oz (153.4 kg)  ? ?[id number] were no vitals filed for this visit.,There is no height or weight on file to calculate BMI. ? ?Constitutional:   ?   Appearance: Healthy appearance. Not in distress.  ?Neck:  ?   Vascular: JVD normal.  ?Pulmonary:  ?   Effort: Pulmonary effort is normal.  ?   Breath sounds: No wheezing. No rales.  ?Cardiovascular:  ?   Normal rate. Regular rhythm. Normal S1. Normal S2.   ?   Murmurs: There is no murmur.  ?Edema: ?   Peripheral edema absent.  ?Abdominal:  ?   Palpations: Abdomen is soft. There is no hepatomegaly.  ?Skin: ?   General: Skin is warm and dry.  ?Neurological:  ?   General: No focal deficit present.  ?   Mental Status: Alert and oriented to person, place and time.  ?   Cranial Nerves: Cranial nerves are intact.  ?EKG/LABS/Other Studies Reviewed  ?  ?ECG personally reviewed by me today -normal sinus rhythm with rate of 67- no acute changes. ? ?Lab Results  ?Component Value Date  ? CREATININE 1.07 04/22/2021  ? BUN 17 04/22/2021  ? NA 141 04/22/2021  ? K 4.2 04/22/2021  ? CL 106 04/22/2021  ? CO2 28 04/22/2021  ? ?Lab Results  ?Component Value Date  ? ALT 33 04/22/2021  ? AST 19 04/22/2021  ? ALKPHOS 77 07/09/2019  ? BILITOT 0.5 04/22/2021  ? ?Lab Results  ?Component Value Date  ? CHOL 115 04/22/2021  ? HDL 37 (L) 04/22/2021  ? LDLCALC 49 04/22/2021  ? TRIG 220 (H) 04/22/2021  ? CHOLHDL 3.1 04/22/2021  ?  ?Lab Results  ?Component Value Date  ? HGBA1C 5.4 04/22/2021  ? ?Assessment & Plan  ?  ?1.  Coronary artery disease: ?-S/p LHC on 06/2009 with 30% hypodense segment and mid LAD stenosis. ?-GDMT consist of atorvastatin 20 mg daily and 81 mg ASA ?-Stable with no anginal symptoms however does report some dyspnea with exertion. ?-2D echo to rule out ischemic cause for new onset dyspnea with  exertion ?Cardiac CTA to rule out ischemic cause for new onset dyspnea on exertion ?-BMET today ?-CBC to evaluate for potential blood loss or anemia ? ?2.  Hyperlipidemia: ?-Patient's current LDL is 49 and triglycerides are 220. ?-Continue atorvastatin 20 mg daily. ?-Patient advised to start fish oil 2 g daily and also discussed importance of reducing sugar and alcohol in diet. ?-Lipids and LFTs in 3 months ? ?3. Morbid Obesity: ?-Current BMI is 39.82 ?- Patient encouraged to increase physical activity as tolerated and reduce calorie intake. ?-Continue low impact exercises such as swimming and walking ? ? Disposition: Follow-up  with APP in 1 month  ?   ?Medication Adjustments/Labs and Tests Ordered: ?Current medicines are reviewed at length with the patient today.  Concerns regarding medicines are outlined above.  ?Tests Ordered: ?No orders of the defined types were placed in this encounter. ? ?Medication Changes:  ?No orders of the defined types were placed in this encounter. ? ? ?Signed, ?Mable Fill, Marissa Nestle, NP ?07/18/2021, 11:24 AM ?[place] ?

## 2021-07-19 ENCOUNTER — Ambulatory Visit: Payer: 59 | Admitting: Nurse Practitioner

## 2021-07-19 ENCOUNTER — Encounter: Payer: Self-pay | Admitting: General Practice

## 2021-07-19 VITALS — BP 124/82 | HR 72 | Ht 77.0 in | Wt 334.8 lb

## 2021-07-19 DIAGNOSIS — R0609 Other forms of dyspnea: Secondary | ICD-10-CM | POA: Diagnosis not present

## 2021-07-19 DIAGNOSIS — R5383 Other fatigue: Secondary | ICD-10-CM

## 2021-07-19 DIAGNOSIS — E782 Mixed hyperlipidemia: Secondary | ICD-10-CM

## 2021-07-19 DIAGNOSIS — I2510 Atherosclerotic heart disease of native coronary artery without angina pectoris: Secondary | ICD-10-CM

## 2021-07-19 LAB — CBC
Hematocrit: 43.5 % (ref 37.5–51.0)
Hemoglobin: 14.8 g/dL (ref 13.0–17.7)
MCH: 29.2 pg (ref 26.6–33.0)
MCHC: 34 g/dL (ref 31.5–35.7)
MCV: 86 fL (ref 79–97)
Platelets: 268 10*3/uL (ref 150–450)
RBC: 5.06 x10E6/uL (ref 4.14–5.80)
RDW: 12.7 % (ref 11.6–15.4)
WBC: 8 10*3/uL (ref 3.4–10.8)

## 2021-07-19 LAB — BASIC METABOLIC PANEL
BUN/Creatinine Ratio: 13 (ref 9–20)
BUN: 12 mg/dL (ref 6–24)
CO2: 23 mmol/L (ref 20–29)
Calcium: 9.2 mg/dL (ref 8.7–10.2)
Chloride: 105 mmol/L (ref 96–106)
Creatinine, Ser: 0.94 mg/dL (ref 0.76–1.27)
Glucose: 92 mg/dL (ref 70–99)
Potassium: 4.4 mmol/L (ref 3.5–5.2)
Sodium: 140 mmol/L (ref 134–144)
eGFR: 93 mL/min/{1.73_m2} (ref >59)

## 2021-07-19 MED ORDER — METOPROLOL TARTRATE 100 MG PO TABS: 100.0000 mg | ORAL_TABLET | Freq: Once | ORAL | 0 refills | Status: DC

## 2021-07-19 MED ORDER — ATORVASTATIN CALCIUM 20 MG PO TABS: 20.0000 mg | ORAL_TABLET | Freq: Every day | ORAL | 1 refills | Status: DC

## 2021-07-19 MED ORDER — FISH OIL 1000 MG PO CAPS: 2000.0000 mg | ORAL_CAPSULE | Freq: Every day | ORAL | 12 refills | Status: AC

## 2021-07-19 NOTE — Patient Instructions (Signed)
Medication Instructions:  ?START FISH OIL 2,000MG  ? ?*If you need a refill on your cardiac medications before your next appointment, please call your pharmacy* ? ?Lab Work: ?BMET AND CBC TODAY AND ?FASTING LIPID AND LFT IN 3 MONTHS ?If you have labs (blood work) drawn today and your tests are completely normal, you will receive your results only by: ?MyChart Message (if you have MyChart) OR A paper copy in the mail ?If you have any lab test that is abnormal or we need to change your treatment, we will call you to review the results. ? ?Testing/Procedures: ?CTA AT [place] SEE ATTACHED-METOPROLOL 90-120 minutes prior to scan  ? ?Echocardiogram - Your physician has requested that you have an echocardiogram. Echocardiography is a painless test that uses sound waves to create images of your heart. It provides your doctor with information about the size and shape of your heart and how well your heart?s chambers and valves are working. This procedure takes approximately one hour. There are no restrictions for this procedure. This will be performed at either our [facility] location - [address] -or- [facility] location [employer] 2nd floor. ? ?Special Instructions ?PLEASE READ AND FOLLOW SALTY 6-ATTACHED-1,800 mg daily ? ?PLEASE READ AND FOLLOW HEART HEALTHY DIET-ATTACHED ? ?Follow-Up: ?Your next appointment:  AFTER TESTING(MAY 15th)  In Person with Gypsy Balsam, NP    ? ?At [facility], you and your health needs are our priority.  As part of our continuing mission to provide you with exceptional heart care, we have created designated Provider Care Teams.  These Care Teams include your primary Cardiologist (physician) and Advanced Practice Providers (APPs -  Physician Assistants and Nurse Practitioners) who all work together to provide you with the care you need, when you need it. ? ? ? ? ?Important Information About Sugar ? ? ? ? ?  ? ? ? ?  ? ? ? ? ?Your cardiac CT will be scheduled at  one of the below locations:  ? ?[facility] ?[address] ?[place], [place] [address] ?[phone] ? ?OR ? ?[facility] ?[address] ?[address] ?[place], [place] [address] ?[phone] ? ?If scheduled at [facility], please arrive at the [facility] and Children[facility] Entrance (Entrance C2) of [facility] 30 minutes prior to test start time. ?You can use the FREE valet parking offered at entrance C (encouraged to control the heart rate for the test)  ?Proceed to the [facility] Radiology Department (first floor) to check-in and test prep. ? ?All radiology patients and guests should use entrance C2 at [facility], accessed from [facility], even though the hospital's physical address listed is [address]. ? ? ? ?If scheduled at [facility], please arrive 15 mins early for check-in and test prep. ? ?Please follow these instructions carefully (unless otherwise directed): ? ?Hold all erectile dysfunction medications at least 3 days (72 hrs) prior to test. ? ?On the Night Before the Test: ?Be sure to Drink plenty of water. ?Do not consume any caffeinated/decaffeinated beverages or chocolate 12 hours prior to your test. ?Do not take any antihistamines 12 hours prior to your test. ? ?On the Day of the Test: ?Drink plenty of water until 1 hour prior to the test. ?Do not eat any food 4 hours prior to the test. ?You may take your regular medications prior to the test.  ?Take metoprolol (Lopressor) two hours prior  to test. ?HOLD Furosemide/Hydrochlorothiazide morning of the test. ? ?TAKE Metoprolol tartrate 100 mg PO 90-120 minutes prior to scan  ? ?     ?After the Test: ?Drink plenty of water. ?After receiving IV contrast, you may experience a mild flushed feeling. This is normal. ?On occasion, you may experience a mild rash up to 24 hours after the test. This is not dangerous. If this occurs, you can take  Benadryl 25 mg and increase your fluid intake. ?If you experience trouble breathing, this can be serious. If it is severe call 911 IMMEDIATELY. If it is mild, please call our office. ?If you take any of these medications: Glipizide/Metformin, Avandament, Glucavance, please do not take 48 hours after completing test unless otherwise instructed. ? ?We will call to schedule your test 2-4 weeks out understanding that some insurance companies will need an authorization prior to the service being performed.  ? ?For non-scheduling related questions, please contact the cardiac imaging nurse navigator should you have any questions/concerns: ?Rockwell Alexandria, Cardiac Imaging Nurse Navigator ?Larey Brick, Cardiac Imaging Nurse Navigator ?[facility] ?Direct Office Dial: [phone]  ? ?For scheduling needs, including cancellations and rescheduling, please call [place], [phone]. ? ? ? ?Heart-Healthy Eating Plan ?Heart-healthy meal planning includes: ?Eating less unhealthy fats. ?Eating more healthy fats. ?Making other changes in your diet. ?Talk with your doctor or a diet specialist (dietitian) to create an eating plan that is right for you. ?What is my plan? ?What are tips for following this plan? ?Cooking ?Avoid frying your food. Try to bake, boil, grill, or broil it instead. You can also reduce fat by: ?Removing the skin from poultry. ?Removing all visible fats from meats. ?Steaming vegetables in water or broth. ?Meal planning ? ?At meals, divide your plate into four equal parts: ?Fill one-half of your plate with vegetables and green salads. ?Fill one-fourth of your plate with whole grains. ?Fill one-fourth of your plate with lean protein foods. ?Eat 4-5 servings of vegetables per day. A serving of vegetables is: ?1 cup of raw or cooked vegetables. ?2 cups of raw leafy greens. ?Eat 4-5 servings of fruit per day. A serving of fruit is: ?1 medium whole fruit. ?? cup of dried fruit. ?? cup of  fresh, frozen, or canned fruit. ?? cup of 100% fruit juice. ?Eat more foods that have soluble fiber. These are apples, broccoli, carrots, beans, peas, and barley. Try to get 20-30 g of fiber per day. ?Eat 4-5 servings of nuts, legumes, and seeds per week: ?1 serving of dried beans or legumes equals ? cup after being cooked. ?1 serving of nuts is ? cup. ?1 serving of seeds equals 1 tablespoon. ?General information ?Eat more home-cooked food. Eat less restaurant, buffet, and fast food. ?Limit or avoid alcohol. ?Limit foods that are high in starch and sugar. ?Avoid fried foods. ?Lose weight if you are overweight. ?Keep track of how much salt (sodium) you eat. This is important if you have high blood pressure. Ask your doctor to tell you more about this. ?Try to add vegetarian meals each week. ?Fats ?Choose healthy fats. These include olive oil and canola oil, flaxseeds, walnuts, almonds, and seeds. ?Eat more omega-3 fats. These include salmon, mackerel, sardines, tuna, flaxseed oil, and ground flaxseeds. Try to eat fish at least 2 times each week. ?Check food labels. Avoid foods with trans fats or high amounts of saturated fat. ?Limit saturated fats. ?These are often found in animal products, such as meats, butter, and cream. ?  These are also found in plant foods, such as palm oil, palm kernel oil, and coconut oil. ?Avoid foods with partially hydrogenated oils in them. These have trans fats. Examples are stick margarine, some tub margarines, cookies, crackers, and other baked goods. ?What foods can I eat? ?Fruits ?All fresh, canned (in natural juice), or frozen fruits. ?Vegetables ?Fresh or frozen vegetables (raw, steamed, roasted, or grilled). Green salads. ?Grains ?Most grains. Choose whole wheat and whole grains most of the time. Rice and pasta, including brown rice and pastas made with whole wheat. ?Meats and other proteins ?Lean, well-trimmed beef, veal, pork, and lamb. Chicken and [place] without skin. All fish and  shellfish. Wild duck, rabbit, pheasant, and venison. Egg whites or low-cholesterol egg substitutes. Dried beans, peas, lentils, and tofu. Seeds and most nuts. ?Dairy ?Low-fat or nonfat cheeses, includin

## 2021-07-19 NOTE — Addendum Note (Signed)
Addended by: Alyson Ingles on: 07/19/2021 05:03 PM ? ? Modules accepted: Orders ? ?

## 2021-08-02 ENCOUNTER — Ambulatory Visit (HOSPITAL_COMMUNITY): Payer: 59 | Attending: Internal Medicine

## 2021-08-02 DIAGNOSIS — R5383 Other fatigue: Secondary | ICD-10-CM | POA: Diagnosis not present

## 2021-08-02 DIAGNOSIS — R0609 Other forms of dyspnea: Secondary | ICD-10-CM | POA: Insufficient documentation

## 2021-08-02 HISTORY — PX: TRANSTHORACIC ECHOCARDIOGRAM: SHX275

## 2021-08-02 LAB — ECHOCARDIOGRAM COMPLETE
Area-P 1/2: 2.91 cm2
S' Lateral: 3.1 cm

## 2021-08-03 ENCOUNTER — Telehealth (HOSPITAL_COMMUNITY): Payer: Self-pay | Admitting: *Deleted

## 2021-08-03 NOTE — Telephone Encounter (Signed)
Reaching out to patient to offer assistance regarding upcoming cardiac imaging study; pt verbalizes understanding of appt date/time, parking situation and where to check in, pre-test NPO status and medications ordered, and verified current allergies; name and call back number provided for further questions should they arise ? ?Larey Brick RN Navigator Cardiac Imaging ?[facility] ?[phone] office ?[phone] cell ? ?Patient to take 100mg  metoprolol tartrate two hours to his cardiac CT scan.  He is aware to arrive at 8am. ?

## 2021-08-04 ENCOUNTER — Ambulatory Visit (HOSPITAL_COMMUNITY)
Admission: RE | Admit: 2021-08-04 | Discharge: 2021-08-04 | Disposition: A | Discharge status: Home / Self Care | Payer: 59 | Source: Ambulatory Visit | Attending: Nurse Practitioner | Admitting: Nurse Practitioner

## 2021-08-04 ENCOUNTER — Other Ambulatory Visit: Payer: Self-pay | Admitting: Cardiology

## 2021-08-04 ENCOUNTER — Encounter (HOSPITAL_COMMUNITY): Payer: Self-pay

## 2021-08-04 DIAGNOSIS — R931 Abnormal findings on diagnostic imaging of heart and coronary circulation: Secondary | ICD-10-CM

## 2021-08-04 DIAGNOSIS — R5383 Other fatigue: Secondary | ICD-10-CM | POA: Diagnosis present

## 2021-08-04 DIAGNOSIS — I2510 Atherosclerotic heart disease of native coronary artery without angina pectoris: Secondary | ICD-10-CM | POA: Diagnosis not present

## 2021-08-04 DIAGNOSIS — R0609 Other forms of dyspnea: Secondary | ICD-10-CM | POA: Insufficient documentation

## 2021-08-04 HISTORY — PX: CT CTA CORONARY W/CA SCORE W/CM &/OR WO/CM: HXRAD787

## 2021-08-04 IMAGING — CT CT HEART MORP W/ CTA COR W/ SCORE W/ CA W/CM &/OR W/O CM
4 of 8 series · 8 of 20 positions shown, 9 images · IV contrast (APPLIED)
Comparison: None Available.
COMPARISON: None Available.

Addendum:
EXAM:
OVER-READ INTERPRETATION  CT CHEST

The following report is an over-read performed by radiologist Dr.
Rey John Garcellano [REDACTED] on 08/04/2021. This
over-read does not include interpretation of cardiac or coronary
anatomy or pathology. The coronary calcium score/coronary CTA
interpretation by the cardiologist is attached.
CLINICAL DATA: This is a 60 year old male with anginal symptoms.
Cardiac/Coronary CTA
TECHNIQUE: The patient was scanned on a Phillips Force scanner.

[Series 6: best diast · axial · 0.43mm/px · z∈[+1359,+1400]mm · 2 of 308 slices shown, 3 images]
[im 103/308  vessel]
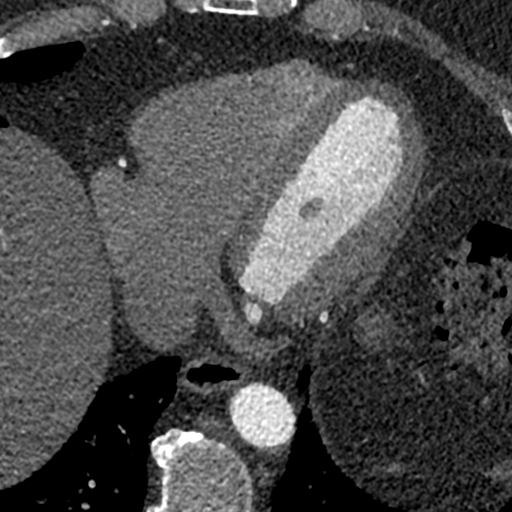
[im 103/308  lung]
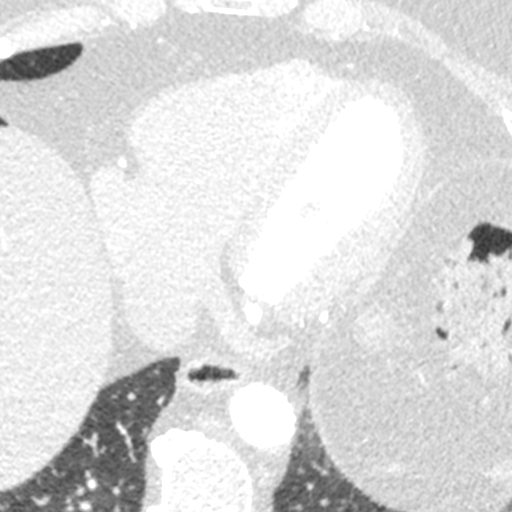
[im 205/308  vessel]
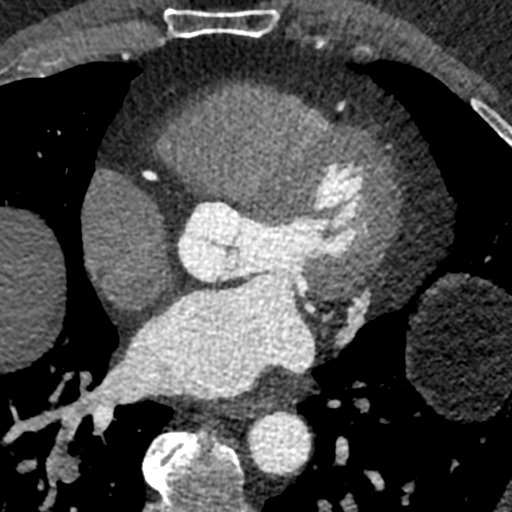

[Series 7: best syst · axial · 0.43mm/px · z∈[+1359,+1400]mm · 2 of 308 slices shown]
[im 103/308  vessel]
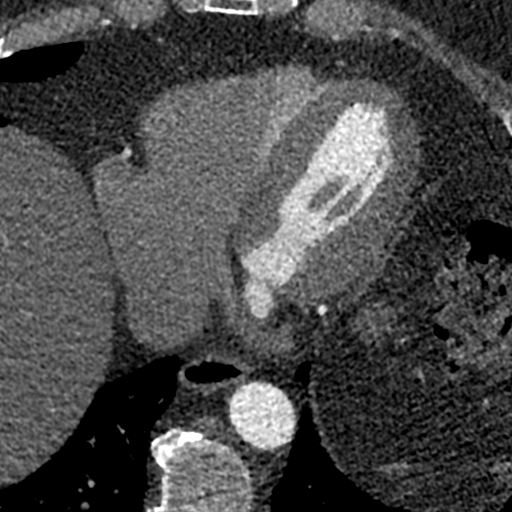
[im 205/308  vessel]
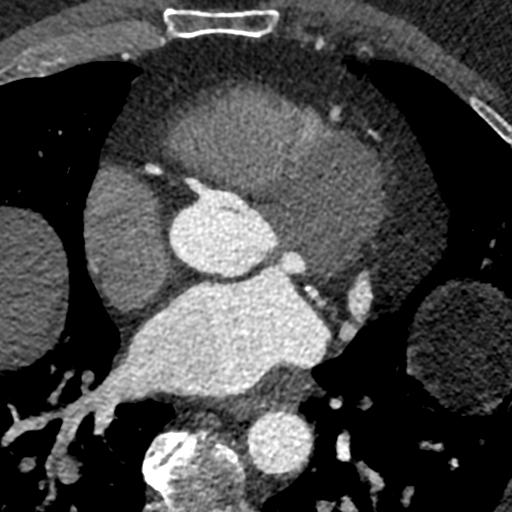

[Series 8: ts diast sharp · axial · 0.43mm/px · z∈[+1359,+1400]mm · 2 of 308 slices shown]
[im 103/308  lung]
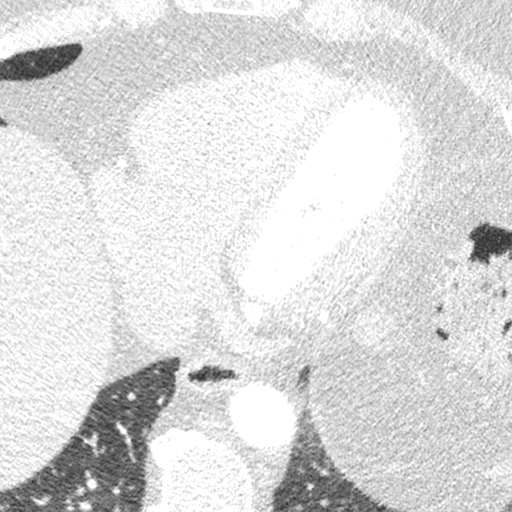
[im 205/308  lung]
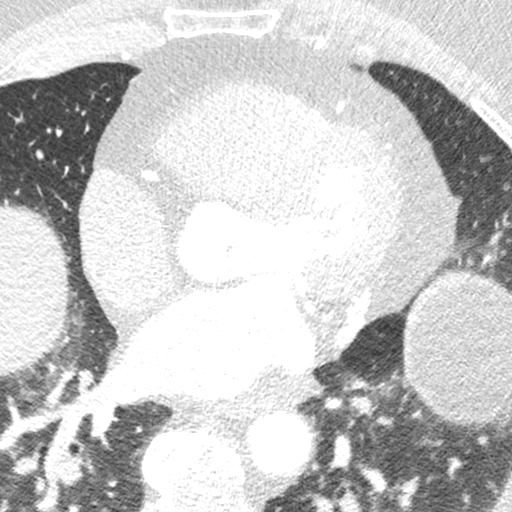

[Series 9: ts syst sharp · axial · 0.43mm/px · z∈[+1359,+1400]mm · 2 of 308 slices shown]
[im 103/308  lung]
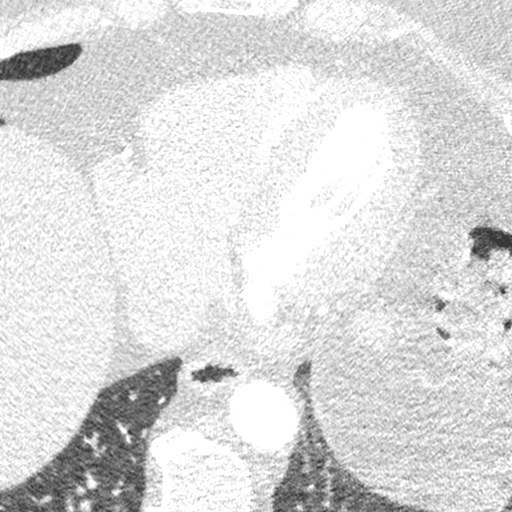
[im 205/308  lung]
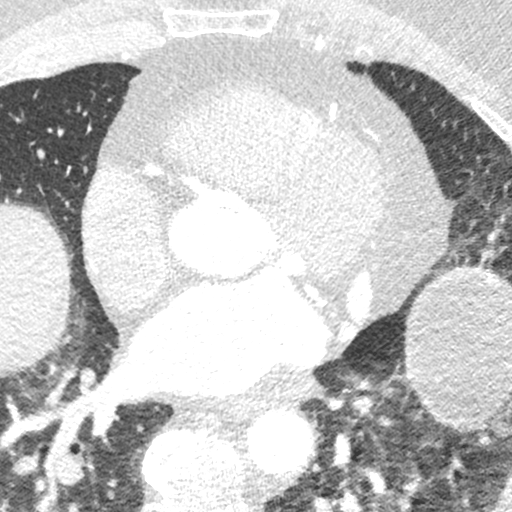

[8 of 20 positions shown; findings below may reference images not displayed]

FINDINGS: Within the visualized portions of the thorax there are no suspicious
appearing pulmonary nodules or masses, there is no acute
consolidative airspace disease, no pleural effusions, no
pneumothorax and no lymphadenopathy. Visualized portions of the
upper abdomen are unremarkable. There are no aggressive appearing
lytic or blastic lesions noted in the visualized portions of the
skeleton.
IMPRESSION: 1. No significant incidental noncardiac findings are noted.
FINDINGS: A 100 kV prospective scan was triggered in the descending thoracic
aorta at 111 HU's. Axial non-contrast 3 mm slices were carried out
through the heart. The data set was analyzed on a dedicated work
station and scored using the Agatson method. Gantry rotation speed
was 250 msecs and collimation was .6 mm. No beta blockade and 0.8 mg
of sl NTG was given. The 3D data set was reconstructed in 5%
intervals of the 67-82 % of the R-R cycle. Diastolic phases were
analyzed on a dedicated work station using MPR, MIP and VRT modes.
The patient received 80 cc of contrast.

Aorta: Normal size.  No calcifications.  No dissection.

Aortic Valve:  Trileaflet.  No calcifications.

Coronary Arteries:  Normal coronary origin.  Right dominance.

RCA is a large dominant artery that gives rise to PDA and PLA. There
is minimal soft plaque in the distal RCA. The proximal to mid RCA
with no plaques.

Left main is a large artery that gives rise to LAD and LCX arteries.

LAD is a large vessel. There is a moderate (50-69%) calcified plaque
in the proximal LAD. The proximal-mid LAD there is a minimal
calcification. There is minimal soft plaque in the mid LAD. As the
LAD tapers off there is minimal soft plaque.

LCX is a non-dominant artery that gives rise to one large OM1
branch. There is a mid focal mild calcification in the mid LCX.
Minimal soft plaque in the mid-distal LCX.

Coronary Calcium Score:

Left main: 0

Left anterior descending artery: 177

Left circumflex artery:

Right coronary artery:

Total: 212

Percentile: 92

Other findings:

Normal pulmonary vein drainage into the left atrium.

Normal left atrial appendage without a thrombus.

Normal size of the pulmonary artery.
IMPRESSION: 1. Coronary calcium score of 212. This was 92 percentile for age and
sex matched control.

2. Normal coronary origin with right dominance.

3. CAD-RADS 3. Moderate stenosis. Consider symptom-guided
anti-ischemic pharmacotherapy as well as risk factor modification
per guideline directed care. Additional analysis with CT FFR will be
submitted.

The noncardiac portion of this study will be interpreted in separate
report by the radiologist.

*** End of Addendum ***
EXAM:
OVER-READ INTERPRETATION  CT CHEST

The following report is an over-read performed by radiologist Dr.
Rey John Garcellano [REDACTED] on 08/04/2021. This
over-read does not include interpretation of cardiac or coronary
anatomy or pathology. The coronary calcium score/coronary CTA
interpretation by the cardiologist is attached.
FINDINGS: Within the visualized portions of the thorax there are no suspicious
appearing pulmonary nodules or masses, there is no acute
consolidative airspace disease, no pleural effusions, no
pneumothorax and no lymphadenopathy. Visualized portions of the
upper abdomen are unremarkable. There are no aggressive appearing
lytic or blastic lesions noted in the visualized portions of the
skeleton.
IMPRESSION: 1. No significant incidental noncardiac findings are noted.

## 2021-08-04 MED ORDER — IOHEXOL 350 MG/ML SOLN
100.0000 mL | Freq: Once | INTRAVENOUS | Status: AC | PRN
  Administered 2021-08-04: 100 mL via INTRAVENOUS

## 2021-08-04 MED ORDER — NITROGLYCERIN 0.4 MG SL SUBL
SUBLINGUAL_TABLET | SUBLINGUAL | Status: AC
  Filled 2021-08-04: qty 2

## 2021-08-04 MED ORDER — NITROGLYCERIN 0.4 MG SL SUBL
0.8000 mg | SUBLINGUAL_TABLET | Freq: Once | SUBLINGUAL | Status: AC
  Administered 2021-08-04: 0.8 mg via SUBLINGUAL

## 2021-08-05 ENCOUNTER — Ambulatory Visit (HOSPITAL_COMMUNITY)
Admission: RE | Admit: 2021-08-05 | Discharge: 2021-08-05 | Disposition: A | Discharge status: Home / Self Care | Payer: 59 | Source: Ambulatory Visit | Attending: Cardiology | Admitting: Cardiology

## 2021-08-05 DIAGNOSIS — R931 Abnormal findings on diagnostic imaging of heart and coronary circulation: Secondary | ICD-10-CM | POA: Diagnosis present

## 2021-08-08 ENCOUNTER — Other Ambulatory Visit: Payer: Self-pay

## 2021-08-08 MED ORDER — METOPROLOL TARTRATE 25 MG PO TABS: ORAL_TABLET | ORAL | 3 refills | Status: DC

## 2021-08-10 ENCOUNTER — Other Ambulatory Visit (HOSPITAL_COMMUNITY): Payer: 59

## 2021-08-14 ENCOUNTER — Encounter: Payer: Self-pay | Admitting: Physician Assistant

## 2021-08-14 NOTE — Progress Notes (Addendum)
d ? ? ?Office Visit  ?  ?Patient Name: Dennis SellersJoseph E Crisanto Jr. ?Date of Encounter: 08/15/2021 ? ?Primary Care Provider:  Wanda PlumpPaz, Jose E, MD ?Primary Cardiologist:  Nanetta BattyJonathan Berry, MD ?Primary Electrophysiologist: None ? ?Chief Complaint  ?  ?Dennis SellersJoseph E Lacerte Jr. is a 61 y.o. male with PMH of CAD s/p LHC 06/2009 with findings of 30% hypotensive LAD stenosis, HLD, atypical chest pain, obesity presents today for 1 month follow-up ? ?  ?Past Medical History:  ?Diagnosis Date  ? Arthritis   ? Atypical chest pain   ? Cholelithiasis 09/2015  ? determined by CT, asymptomatic  ? Coronary artery disease   ? Diverticulitis   ? Family history of heart disease   ? GERD (gastroesophageal reflux disease)   ? diet related  ? Hyperlipidemia   ? Seasonal allergies   ? ?Past Surgical History:  ?Procedure Laterality Date  ? CARDIAC CATHETERIZATION  04/03/2008  ? Dr. Allyson SabalBerry; less than 30% occlusive disease  ? CHOLECYSTECTOMY N/A 06/14/2018  ? Procedure: LAPAROSCOPIC CHOLECYSTECTOMY;  Surgeon: Axel Filleramirez, Armando, MD;  [facility];  Service: General;  Laterality: N/A;  ? CT CTA CORONARY W/CA SCORE W/CM &/OR WO/CM  08/04/2021  ? CT FFR analysis showed flow limitations in the  distal tip of the LAD.Coronary calcium score of 212  ? INSERTION OF MESH  12/13/2018  ? Procedure: Insertion Of Mesh;  Surgeon: Axel Filleramirez, Armando, MD;  [facility];  Service: General;;  ? TONSILLECTOMY    ? age 89  ? TRANSTHORACIC ECHOCARDIOGRAM  08/02/2021  ? EF 55-60%,Grade I DD,mild LVH,mild aortic dialation of 43mm  ? UMBILICAL HERNIA REPAIR N/A 12/13/2018  ? Procedure: LAPAROSCOPIC UMBILICAL HERNIA REPAIR;  Surgeon: Axel Filleramirez, Armando, MD;  [facility];  Service: General;  Laterality: N/A;  ? ? ?Allergies ? ?No Known Allergies ? ?History of Present Illness  ?  ?Dennis SellersJoseph E Ghuman Jr. was last seen on 4/17 2023 for follow-up of HLD and CAD. Patient had coronary cath in 2011 for abnormal Myoview, patient treated with medical therapy for 30% hypodense segment and mid LAD  stenosis.  Patient was doing well until 07/2017 when he presented to ER with complaint of atypical chest pain. EKG was reviewed with no acute changes and enzymes were unremarkable. ? ?Since last being seen in the office patient reports he is doing well and tolerating his medications with no side effects.  He has started exercising and is currently on a diet to lose additional weight. During his last office visit he had a complaint of dyspnea on exertion and was sent for 2D echo and cardiac CTA to rule out ischemic cause. Cardiac CTA revealed calcium score of 212 with moderate stenosis of LAD with FFR showing limitations in the distal tip of the LAD. The 2D echo revealed a normal heart pumping function with an EF of 55-60%, however there was an aortic dilation noted of 43 mm.He was started on metoprolol 12.5 mg twice daily for secondary prevention and risk factor modification of CAD. Patient denies chest pain, palpitations, dyspnea, PND, orthopnea, nausea, vomiting, dizziness, syncope, edema, weight gain, or early satiety. ? ?Home Medications  ?  ?Current Outpatient Medications  ?Medication Sig Dispense Refill  ? aspirin EC 81 MG tablet Take 81 mg by mouth daily.    ? atorvastatin (LIPITOR) 20 MG tablet Take 1 tablet (20 mg total) by mouth at bedtime. 90 tablet 1  ? colestipol (COLESTID) 1 g tablet Take 1 tablet (1 g total) by mouth 2 (two)  times daily. (Patient taking differently: Take 1 g by mouth 2 (two) times daily as needed.) 180 tablet 3  ? methylcellulose (CITRUCEL) oral powder If needed    ? metoprolol tartrate (LOPRESSOR) 25 MG tablet Take 1/2 tablet 12.5 mg twice a day 90 tablet 3  ? Multiple Vitamin (MULTIVITAMIN WITH MINERALS) TABS tablet Take 1 tablet by mouth daily.    ? Omega-3 Fatty Acids (FISH OIL) 1000 MG CAPS Take 2 capsules (2,000 mg total) by mouth daily. 60 capsule 12  ? sildenafil (REVATIO) 20 MG tablet Take 3-4 tablets (60-80 mg total) by mouth at bedtime as needed. 30 tablet 3  ? ?No current  facility-administered medications for this visit.  ?  ? ?Review of Systems  ?Please see the history of present illness.    ? ?All other systems reviewed and are otherwise negative except as noted above. ? ?Physical Exam  ?  ?Wt Readings from Last 3 Encounters:  ?08/15/21 (!) 324 lb 12.8 oz (147.3 kg)  ?07/19/21 (!) 334 lb 12.8 oz (151.9 kg)  ?06/13/21 (!) 335 lb 12.8 oz (152.3 kg)  ? ?VS: ?Vitals:  ? 08/15/21 1537  ?BP: 118/84  ?Pulse: 62  ?SpO2: 96%  ?,Body mass index is 38.52 kg/m?. ? ?Constitutional:   ?   Appearance: Healthy appearance. Not in distress.  ?Neck:  ?   Vascular: JVD normal.  ?Pulmonary:  ?   Effort: Pulmonary effort is normal.  ?   Breath sounds: No wheezing. No rales. Diminished in the bases ?Cardiovascular:  ?   Normal rate. Regular rhythm. Normal S1. Normal S2.   ?   Murmurs: There is no murmur.  ?Edema: ?   Peripheral edema absent.  ?Abdominal:  ?   Palpations: Abdomen is soft non tender. There is no hepatomegaly.  ?Skin: ?   General: Skin is warm and dry.  ?Neurological:  ?   General: No focal deficit present.  ?   Mental Status: Alert and oriented to person, place and time.  ?   Cranial Nerves: Cranial nerves are intact.  ?EKG/LABS/Other Studies Reviewed  ?  ?ECG personally reviewed by me today -none completed today ? ? ?Lab Results  ?Component Value Date  ? WBC 8.0 07/19/2021  ? HGB 14.8 07/19/2021  ? HCT 43.5 07/19/2021  ? MCV 86 07/19/2021  ? PLT 268 07/19/2021  ? ?Lab Results  ?Component Value Date  ? CREATININE 0.94 07/19/2021  ? BUN 12 07/19/2021  ? NA 140 07/19/2021  ? K 4.4 07/19/2021  ? CL 105 07/19/2021  ? CO2 23 07/19/2021  ? ?Lab Results  ?Component Value Date  ? ALT 33 04/22/2021  ? AST 19 04/22/2021  ? ALKPHOS 77 07/09/2019  ? BILITOT 0.5 04/22/2021  ? ?Lab Results  ?Component Value Date  ? CHOL 115 04/22/2021  ? HDL 37 (L) 04/22/2021  ? LDLCALC 49 04/22/2021  ? TRIG 220 (H) 04/22/2021  ? CHOLHDL 3.1 04/22/2021  ?  ?Lab Results  ?Component Value Date  ? HGBA1C 5.4 04/22/2021   ? ? ?Assessment & Plan  ?  ?1.  Coronary artery disease: ?- s/p LHC in 2011 which revealed nonobstructive CAD. ?-Coronary CTA performed to rule out ischemia for complaint of shortness of breath revelaedcalcium score of 212 with moderate stenosis of LAD ?-Metoprolol 12.5 mg twice daily was added to current GDMT of atorvastatin 20 mg, and 81 mg ASA daily ?-Stable with no anginal symptoms. No indication for ischemic evaluation.   ? ?2.  Hyperlipidemia: ?-Patient's current  LDL is 49 and triglycerides are 220. ?-Continue atorvastatin 20 mg daily with 2000 mg fish oil supplement. ?-We will start Vascepa if triglycerides elevated on recheck in July ? ?3.  Dyspnea on exertion: ?-Patient reports that his dyspnea has reduced with his current weight loss. ? ?4.  Morbid obesity: ?-Current BMI is 39.82 ?-Continue low impact exercises such as swimming and walking ?-Patient is currently motivated to lose weight and states that he plans to continue calorie reduction and weight loss strategies. ? ?5.  Dilated aortic root: ?-2D echo performed on 08/02/2021 revealed 43 mm dilated ascending aorta ?-Patient started 12.5 mg metoprolol twice daily for blood pressure reduction and secondary prevention. ?-Treated with medical therapy ?-He will return for 23-month follow-up echo to evaluate aortic dilation ?-I discussed with the patient the importance of abstaining from strenuous exercise and weight lifting and importance of maintaining normal blood pressure and managing progression of aortic dilation ? ?Disposition: Follow-up with Nanetta Batty, MD  in 4 months months ?   ?Medication Adjustments/Labs and Tests Ordered: ?Current medicines are reviewed at length with the patient today.  Concerns regarding medicines are outlined above.  ? ?Signed, ?Napoleon Form, Leodis Rains, NP ?08/15/2021, 4:37 PM ? [facility] ?

## 2021-08-15 ENCOUNTER — Encounter: Payer: Self-pay | Admitting: Physician Assistant

## 2021-08-15 ENCOUNTER — Ambulatory Visit: Payer: 59 | Admitting: Nurse Practitioner

## 2021-08-15 VITALS — BP 118/84 | HR 62 | Ht 77.0 in | Wt 324.8 lb

## 2021-08-15 DIAGNOSIS — I2510 Atherosclerotic heart disease of native coronary artery without angina pectoris: Secondary | ICD-10-CM

## 2021-08-15 DIAGNOSIS — E782 Mixed hyperlipidemia: Secondary | ICD-10-CM

## 2021-08-15 DIAGNOSIS — I77810 Thoracic aortic ectasia: Secondary | ICD-10-CM

## 2021-08-15 DIAGNOSIS — R5383 Other fatigue: Secondary | ICD-10-CM

## 2021-08-15 DIAGNOSIS — I719 Aortic aneurysm of unspecified site, without rupture: Secondary | ICD-10-CM

## 2021-08-15 NOTE — Patient Instructions (Signed)
Medication Instructions:  ?Your physician recommends that you continue on your current medications as directed. Please refer to the Current Medication list given to you today.  ?*If you need a refill on your cardiac medications before your next appointment, please call your pharmacy* ? ? ?Lab Work: ?NONE ordered at this time of appointment  ? ?If you have labs (blood work) drawn today and your tests are completely normal, you will receive your results only by: ?MyChart Message (if you have MyChart) OR ?A paper copy in the mail ?If you have any lab test that is abnormal or we need to change your treatment, we will call you to review the results. ? ? ?Testing/Procedures: ?(Repeat Echocardiogram October 2023) ?Your physician has requested that you have an echocardiogram. Echocardiography is a painless test that uses sound waves to create images of your heart. It provides your doctor with information about the size and shape of your heart and how well your heart?s chambers and valves are working. This procedure takes approximately one hour. There are no restrictions for this procedure.  ? ? ?Follow-Up: ?At [facility], you and your health needs are our priority.  As part of our continuing mission to provide you with exceptional heart care, we have created designated Provider Care Teams.  These Care Teams include your primary Cardiologist (physician) and Advanced Practice Providers (APPs -  Physician Assistants and Nurse Practitioners) who all work together to provide you with the care you need, when you need it. ? ?We recommend signing up for the patient portal called "MyChart".  Sign up information is provided on this After Visit Summary.  MyChart is used to connect with patients for Virtual Visits (Telemedicine).  Patients are able to view lab/test results, encounter notes, upcoming appointments, etc.  Non-urgent messages can be sent to your provider as well.   ?To learn more about what you can do with MyChart, go  to [email or web address].   ? ?Your next appointment:   ? November 01, 2021 ? ?The format for your next appointment:   ?In Person ? ?Provider:   ?Nanetta Batty, MD   ? ? ?Other Instructions ? ? ?Important Information About Sugar ? ? ? ? ? ? ?

## 2021-08-26 ENCOUNTER — Ambulatory Visit: Payer: 59 | Admitting: Internal Medicine

## 2021-08-26 ENCOUNTER — Encounter: Payer: Self-pay | Admitting: Internal Medicine

## 2021-08-26 DIAGNOSIS — K915 Postcholecystectomy syndrome: Secondary | ICD-10-CM

## 2021-08-26 DIAGNOSIS — G629 Polyneuropathy, unspecified: Secondary | ICD-10-CM | POA: Diagnosis not present

## 2021-08-26 DIAGNOSIS — I2510 Atherosclerotic heart disease of native coronary artery without angina pectoris: Secondary | ICD-10-CM | POA: Diagnosis not present

## 2021-08-26 NOTE — Assessment & Plan Note (Signed)
Here for ROV Hyperlipidemia: On Lipitor, last FLP satisfactory Morbid obesity: Started to eat healthier in the last month, in his own scales he went from 335 pounds to 323 pounds.  Encouraged to continue on that path, he is using NOOM. Neuropathy: Saw neurology 06/13/2021 due to paresthesias, Dx neuropathy, blood work negative, they will consider nerve conduction study.  Was recommended off on the [place] which is some antioxidant.  They noted that weight loss would be quite beneficial. CAD, dilated aortic root Saw cardiology 08/15/2021, felt to be stable on beta-blockers, aspirin, atorvastatin. Was rec ECHO by 02-2022 for re-eval of dilated aortic root. Postcholecystectomy syndrome Saw GI 06/01/2021, he had loose stools postprandially, they noted recent benign colonoscopy.  He responded to cholestyramine and diagnosis was postcholecystectomy syndrome. They also noticed a [employer] RTC 6 months for routine checkup

## 2021-08-26 NOTE — Progress Notes (Signed)
Subjective:    Patient ID: Dennis Castro., male    DOB: 30-Apr-1960, 61 y.o.   MRN: [id number]  DOS:  08/26/2021 Type of visit - description: f/u  Since the last office visit is feeling well. Notes from cardiology, GI and neurology reviewed.  He is doing better with diet. Denies chest pain or difficulty breathing. Very rarely has peri ankle edema  Wt Readings from Last 3 Encounters:  08/26/21 (!) 328 lb (148.8 kg)  08/15/21 (!) 324 lb 12.8 oz (147.3 kg)  07/19/21 (!) 334 lb 12.8 oz (151.9 kg)     Review of Systems See above   Past Medical History:  Diagnosis Date   Arthritis    Atypical chest pain    Cholelithiasis 09/2015   determined by CT, asymptomatic   Coronary artery disease    Diverticulitis    Family history of heart disease    GERD (gastroesophageal reflux disease)    diet related   Hyperlipidemia    Seasonal allergies     Past Surgical History:  Procedure Laterality Date   CARDIAC CATHETERIZATION  04/03/2008   Dr. Allyson Sabal; less than 30% occlusive disease   CHOLECYSTECTOMY N/A 06/14/2018   Procedure: LAPAROSCOPIC CHOLECYSTECTOMY;  Surgeon: Axel Filler, MD;  Location: [facility] OR;  Service: General;  Laterality: N/A;   CT CTA CORONARY W/CA SCORE W/CM &/OR WO/CM  08/04/2021   CT FFR analysis showed flow limitations in the  distal tip of the LAD.Coronary calcium score of 212   INSERTION OF MESH  12/13/2018   Procedure: Insertion Of Mesh;  Surgeon: Axel Filler, MD;  Location: [facility] OR;  Service: General;;   TONSILLECTOMY     age 76   TRANSTHORACIC ECHOCARDIOGRAM  08/02/2021   EF 55-60%,Grade I DD,mild LVH,mild aortic dialation of 43mm   UMBILICAL HERNIA REPAIR N/A 12/13/2018   Procedure: LAPAROSCOPIC UMBILICAL HERNIA REPAIR;  Surgeon: Axel Filler, MD;  Location: [facility] OR;  Service: General;  Laterality: N/A;    Current Outpatient Medications  Medication Instructions   aspirin EC 81 mg, Oral, Daily   atorvastatin (LIPITOR) 20 mg, Oral, Daily at  bedtime   colestipol (COLESTID) 1 g, Oral, 2 times daily   Fish Oil 2,000 mg, Oral, Daily   methylcellulose (CITRUCEL) oral powder If needed   metoprolol tartrate (LOPRESSOR) 25 MG tablet Take 1/2 tablet 12.5 mg twice a day   Multiple Vitamin (MULTIVITAMIN WITH MINERALS) TABS tablet 1 tablet, Oral, Daily   sildenafil (REVATIO) 60-80 mg, Oral, At bedtime PRN       Objective:   Physical Exam BP 126/80   Pulse 68   Temp 98.4 F (36.9 C) (Oral)   Resp 16   Ht 6\' 5"  (1.956 m)   Wt (!) 328 lb (148.8 kg)   SpO2 98%   BMI 38.90 kg/m  General:   Well developed, NAD, BMI noted. HEENT:  Normocephalic . Face symmetric, atraumatic Lungs:  CTA B Normal respiratory effort, no intercostal retractions, no accessory muscle use. Heart: RRR,  no murmur.  Lower extremities: no pretibial edema bilaterally  Skin: Not pale. Not jaundice Neurologic:  alert & oriented X3.  Speech normal, gait appropriate for age and unassisted Psych--  Cognition and judgment appear intact.  Cooperative with normal attention span and concentration.  Behavior appropriate. No anxious or depressed appearing.      Assessment     Assessment   Hyperlipidemia   GERD Obesity , Morbid  CAD:  --Chest pain: Myoview stress test subtle anterior ischemia --  Cardiac cath 06-24-09: Noncritical 30% LAD with subtle anteroapical HK. Diverticulitis Seasonal allergies +FH prostate ca (father age 15) +FH CAD (mother age 78) kidney  Stones Neuropathy (Dx at neurology 06-2021) MUTYH  heterozygous mutation (per GI note reviewed)    PLAN: Here for ROV Hyperlipidemia: On Lipitor, last FLP satisfactory Morbid obesity: Started to eat healthier in the last month, in his own scales he went from 335 pounds to 323 pounds.  Encouraged to continue on that path, he is using NOOM. Neuropathy: Saw neurology 06/13/2021 due to paresthesias, Dx neuropathy, blood work negative, they will consider nerve conduction study.  Was recommended  off on the [place] which is some antioxidant.  They noted that weight loss would be quite beneficial. CAD, dilated aortic root Saw cardiology 08/15/2021, felt to be stable on beta-blockers, aspirin, atorvastatin. Was rec ECHO by 02-2022 for re-eval of dilated aortic root. Postcholecystectomy syndrome Saw GI 06/01/2021, he had loose stools postprandially, they noted recent benign colonoscopy.  He responded to cholestyramine and diagnosis was postcholecystectomy syndrome. They also noticed a [employer] RTC 6 months for routine checkup

## 2021-08-26 NOTE — Patient Instructions (Addendum)
Recommend to proceed with covid booster (bivalent) at your pharmacy.      GO TO THE FRONT DESK, PLEASE SCHEDULE YOUR APPOINTMENTS Come back for  a check up in 6 months

## 2021-10-20 DIAGNOSIS — Z0289 Encounter for other administrative examinations: Secondary | ICD-10-CM

## 2021-10-26 ENCOUNTER — Encounter (INDEPENDENT_AMBULATORY_CARE_PROVIDER_SITE_OTHER): Payer: Self-pay | Admitting: Bariatrics

## 2021-10-26 ENCOUNTER — Ambulatory Visit (INDEPENDENT_AMBULATORY_CARE_PROVIDER_SITE_OTHER): Payer: 59 | Admitting: Bariatrics

## 2021-10-26 VITALS — BP 111/72 | HR 64 | Temp 97.9°F | Ht 77.0 in | Wt 327.0 lb

## 2021-10-26 DIAGNOSIS — I2510 Atherosclerotic heart disease of native coronary artery without angina pectoris: Secondary | ICD-10-CM | POA: Diagnosis not present

## 2021-10-26 DIAGNOSIS — R739 Hyperglycemia, unspecified: Secondary | ICD-10-CM | POA: Diagnosis not present

## 2021-10-26 DIAGNOSIS — R5383 Other fatigue: Secondary | ICD-10-CM

## 2021-10-26 DIAGNOSIS — Z1331 Encounter for screening for depression: Secondary | ICD-10-CM | POA: Diagnosis not present

## 2021-10-26 DIAGNOSIS — E782 Mixed hyperlipidemia: Secondary | ICD-10-CM | POA: Diagnosis not present

## 2021-10-26 DIAGNOSIS — R0602 Shortness of breath: Secondary | ICD-10-CM

## 2021-10-26 DIAGNOSIS — Z6838 Body mass index (BMI) 38.0-38.9, adult: Secondary | ICD-10-CM

## 2021-10-26 DIAGNOSIS — E559 Vitamin D deficiency, unspecified: Secondary | ICD-10-CM

## 2021-10-27 ENCOUNTER — Encounter (INDEPENDENT_AMBULATORY_CARE_PROVIDER_SITE_OTHER): Payer: Self-pay | Admitting: Bariatrics

## 2021-10-27 DIAGNOSIS — E88819 Insulin resistance, unspecified: Secondary | ICD-10-CM | POA: Insufficient documentation

## 2021-10-27 DIAGNOSIS — E8881 Metabolic syndrome: Secondary | ICD-10-CM | POA: Insufficient documentation

## 2021-10-27 LAB — COMPREHENSIVE METABOLIC PANEL
ALT: 32 IU/L (ref 0–44)
AST: 18 IU/L (ref 0–40)
Albumin/Globulin Ratio: 1.9 (ref 1.2–2.2)
Albumin: 4.2 g/dL (ref 3.8–4.9)
Alkaline Phosphatase: 88 IU/L (ref 44–121)
BUN/Creatinine Ratio: 16 (ref 10–24)
BUN: 16 mg/dL (ref 8–27)
Bilirubin Total: 0.4 mg/dL (ref 0.0–1.2)
CO2: 23 mmol/L (ref 20–29)
Calcium: 9.1 mg/dL (ref 8.6–10.2)
Chloride: 104 mmol/L (ref 96–106)
Creatinine, Ser: 0.97 mg/dL (ref 0.76–1.27)
Globulin, Total: 2.2 g/dL (ref 1.5–4.5)
Glucose: 83 mg/dL (ref 70–99)
Potassium: 4.4 mmol/L (ref 3.5–5.2)
Sodium: 141 mmol/L (ref 134–144)
Total Protein: 6.4 g/dL (ref 6.0–8.5)
eGFR: 89 mL/min/{1.73_m2} (ref >59)

## 2021-10-27 LAB — TSH+T4F+T3FREE
Free T4: 1.06 ng/dL (ref 0.82–1.77)
T3, Free: 3.6 pg/mL (ref 2.0–4.4)
TSH: 1.67 u[IU]/mL (ref 0.450–4.500)

## 2021-10-27 LAB — HEMOGLOBIN A1C
Est. average glucose Bld gHb Est-mCnc: 111 mg/dL
Hgb A1c MFr Bld: 5.5 % (ref 4.8–5.6)

## 2021-10-27 LAB — LIPID PANEL WITH LDL/HDL RATIO
Cholesterol, Total: 107 mg/dL (ref 100–199)
HDL: 41 mg/dL (ref >39)
LDL Chol Calc (NIH): 48 mg/dL (ref 0–99)
LDL/HDL Ratio: 1.2 ratio (ref 0.0–3.6)
Triglycerides: 94 mg/dL (ref 0–149)
VLDL Cholesterol Cal: 18 mg/dL (ref 5–40)

## 2021-10-27 LAB — VITAMIN D 25 HYDROXY (VIT D DEFICIENCY, FRACTURES): Vit D, 25-Hydroxy: 33.8 ng/mL (ref 30.0–100.0)

## 2021-10-27 LAB — INSULIN, RANDOM: INSULIN: 29.4 u[IU]/mL — ABNORMAL HIGH (ref 2.6–24.9)

## 2021-11-01 ENCOUNTER — Ambulatory Visit: Payer: 59 | Admitting: Cardiovascular Disease

## 2021-11-07 ENCOUNTER — Encounter (INDEPENDENT_AMBULATORY_CARE_PROVIDER_SITE_OTHER): Payer: Self-pay | Admitting: Bariatrics

## 2021-11-07 NOTE — Progress Notes (Signed)
Chief Complaint:   OBESITY Dennis Castro (MR# [id number]) is a 61 y.o. male who presents for evaluation and treatment of obesity and related comorbidities. Current BMI is Body mass index is 38.78 kg/m. Dennis Castro has been struggling with his weight for many years and has been unsuccessful in either losing weight, maintaining weight loss, or reaching his healthy weight goal.  Dennis Castro is currently in the action stage of change and ready to dedicate time achieving and maintaining a healthier weight. Dennis Castro is interested in becoming our patient and working on intensive lifestyle modifications including (but not limited to) diet and exercise for weight loss.  Dennis Castro's habits were reviewed today and are as follows: His family eats meals together, he struggles with family and or coworkers weight loss sabotage, his desired weight loss is 107 lbs, he started gaining weight around 61-50 years old, his heaviest weight ever was 340 pounds, he has significant food cravings issues, he snacks frequently in the evenings, he is frequently drinking liquids with calories, he frequently makes poor food choices, he has problems with excessive hunger, he frequently eats larger portions than normal, and he struggles with emotional eating.  Depression Screen Dennis Castro Food and Mood (modified PHQ-9) score was 16.     10/26/2021    6:56 AM  Depression screen PHQ 2/9  Decreased Interest 3  Down, Depressed, Hopeless 2  PHQ - 2 Score 5  Altered sleeping 1  Tired, decreased energy 3  Change in appetite 2  Feeling bad or failure about yourself  2  Trouble concentrating 3  Moving slowly or fidgety/restless 0  Suicidal thoughts 0  PHQ-9 Score 16  Difficult doing work/chores Not difficult at all   Subjective:   1. Other fatigue Dennis Castro admits to daytime somnolence and admits to waking up still tired. Patient has a history of symptoms of daytime fatigue and morning fatigue. Dennis Castro generally gets 7 hours of  sleep per night, and states that he has generally restful sleep. Snoring is present. Apneic episodes are not present. Epworth Sleepiness Score is 3.   2. SOB (shortness of breath) on exertion Dennis Castro notes increasing shortness of breath with exercising and seems to be worsening over time with weight gain. He notes getting out of breath sooner with activity than he used to. This has not gotten worse recently. Sava denies shortness of breath at rest or orthopnea.  3. Mixed hyperlipidemia Quantel is currently taking atorvastatin and fish oil.  4. Hyperglycemia, unspecified Dennis Castro's last A1c on 04/25/2021 was 5.4.  5. Coronary artery disease involving native coronary artery of native heart without angina pectoris Dennis Castro is currently taking Lopressor and omega-3.  Assessment/Plan:   1. Other fatigue Dennis Castro does feel that his weight is causing his energy to be lower than it should be. Fatigue may be related to obesity, depression or many other causes. Labs Dennis Castro be ordered, and in the meanwhile, Dennis Castro Dennis Castro focus on self care including making healthy food choices, increasing physical activity and focusing on stress reduction.  - Comprehensive metabolic panel - [id number] - VITAMIN D 25 Hydroxy (Vit-D Deficiency, Fractures)  2. SOB (shortness of breath) on exertion Dennis Castro does feel that he gets out of breath more easily that he used to when he exercises. Dennis Castro's shortness of breath appears to be obesity related and exercise induced. He has agreed to work on weight loss and gradually increase exercise to treat his exercise induced shortness of breath. Dennis Castro continue to monitor closely.  3. Mixed  hyperlipidemia We Dennis Castro check labs today, and Dennis Castro Dennis Castro continue his medications as directed.  - Comprehensive metabolic panel - Lipid Panel With LDL/HDL Ratio  4. Hyperglycemia, unspecified We Dennis Castro check labs today, and we Dennis Castro follow-up at Dennis Castro's next visit.  - Comprehensive metabolic  panel - Hemoglobin A1c - Insulin, random  5. Coronary artery disease involving native coronary artery of native heart without angina pectoris Dennis Castro Dennis Castro continue to follow-up with his cardiologist.  6. Depression screening Dennis Castro had a positive depression screening. Depression is commonly associated with obesity and often results in emotional eating behaviors. We Dennis Castro monitor this closely and work on CBT to help improve the non-hunger eating patterns. Referral to Psychology may be required if no improvement is seen as he continues in our clinic.  7. Class 2 severe obesity with serious comorbidity and body mass index (BMI) of 38.0 to 38.9 in adult, unspecified obesity type [facility]) Dennis Castro is currently in the action stage of change and his goal is to continue with weight loss efforts. I recommend Tip begin the structured treatment plan as follows:  He has agreed to the Category 4 Plan.  Meal planning and mindful eating were discussed.  Review labs with the patient from 07/19/2021, BMP, CBC, glucose, and A1c on 04/22/2021.  Exercise goals: Yard work and walking.  Behavioral modification strategies: increasing lean protein intake, decreasing simple carbohydrates, increasing vegetables, increasing water intake, decreasing eating out, no skipping meals, meal planning and cooking strategies, keeping healthy foods in the home, and planning for success.  He was informed of the importance of frequent follow-up visits to maximize his success with intensive lifestyle modifications for his multiple health conditions. He was informed we would discuss his lab results at his next visit unless there is a critical issue that needs to be addressed sooner. Dennis Castro agreed to keep his next visit at the agreed upon time to discuss these results.  Objective:   Blood pressure 111/72, pulse 64, temperature 97.9 F (36.6 C), height 6\' 5"  (1.956 m), weight (!) 327 lb (148.3 kg), SpO2 96 %. Body mass index is 38.78  kg/m.  EKG: Normal sinus rhythm, rate 67 BPM.  Indirect Calorimeter completed today shows a VO2 of 362 and a REE of 2506.  His calculated basal metabolic rate is thus his basal metabolic rate is worse than expected.  General: Cooperative, alert, well developed, in no acute distress. HEENT: Conjunctivae and lids unremarkable. Cardiovascular: Regular rhythm.  Lungs: Normal work of breathing. Neurologic: No focal deficits.   Lab Results  Component Value Date   CREATININE 0.97 10/26/2021   BUN 16 10/26/2021   NA 141 10/26/2021   K 4.4 10/26/2021   CL 104 10/26/2021   CO2 23 10/26/2021   Lab Results  Component Value Date   ALT 32 10/26/2021   AST 18 10/26/2021   ALKPHOS 88 10/26/2021   BILITOT 0.4 10/26/2021   Lab Results  Component Value Date   HGBA1C 5.5 10/26/2021   HGBA1C 5.4 04/22/2021   HGBA1C 5.5 03/05/2020   HGBA1C 5.9 01/14/2019   HGBA1C 5.7 07/10/2017   Lab Results  Component Value Date   INSULIN 29.4 (H) 10/26/2021   Lab Results  Component Value Date   TSH 1.670 10/26/2021   Lab Results  Component Value Date   CHOL 107 10/26/2021   HDL 41 10/26/2021   LDLCALC 48 10/26/2021   TRIG 94 10/26/2021   CHOLHDL 3.1 04/22/2021   Lab Results  Component Value Date   WBC  8.0 07/19/2021   HGB 14.8 07/19/2021   HCT 43.5 07/19/2021   MCV 86 07/19/2021   PLT 268 07/19/2021   No results found for: "IRON", "TIBC", "FERRITIN"  Attestation Statements:   Reviewed by clinician on day of visit: allergies, medications, problem list, medical history, surgical history, family history, social history, and previous encounter notes.   Trude Mcburney, am acting as Energy manager for [employer], DO.  I have reviewed the above documentation for accuracy and completeness, and I agree with the above. Corinna Capra, DO

## 2021-11-09 ENCOUNTER — Encounter (INDEPENDENT_AMBULATORY_CARE_PROVIDER_SITE_OTHER): Payer: Self-pay | Admitting: Bariatrics

## 2021-11-09 ENCOUNTER — Ambulatory Visit (INDEPENDENT_AMBULATORY_CARE_PROVIDER_SITE_OTHER): Payer: 59 | Admitting: Bariatrics

## 2021-11-09 ENCOUNTER — Encounter (INDEPENDENT_AMBULATORY_CARE_PROVIDER_SITE_OTHER): Payer: Self-pay

## 2021-11-09 VITALS — BP 126/70 | HR 64 | Temp 98.1°F | Ht 77.0 in

## 2021-11-09 DIAGNOSIS — E559 Vitamin D deficiency, unspecified: Secondary | ICD-10-CM | POA: Diagnosis not present

## 2021-11-09 DIAGNOSIS — E8881 Metabolic syndrome: Secondary | ICD-10-CM

## 2021-11-09 DIAGNOSIS — E669 Obesity, unspecified: Secondary | ICD-10-CM | POA: Diagnosis not present

## 2021-11-09 DIAGNOSIS — K5909 Other constipation: Secondary | ICD-10-CM

## 2021-11-09 DIAGNOSIS — Z6837 Body mass index (BMI) 37.0-37.9, adult: Secondary | ICD-10-CM

## 2021-11-09 DIAGNOSIS — E66812 Obesity, class 2: Secondary | ICD-10-CM

## 2021-11-09 DIAGNOSIS — E88819 Insulin resistance, unspecified: Secondary | ICD-10-CM

## 2021-11-09 MED ORDER — VITAMIN D (ERGOCALCIFEROL) 1.25 MG (50000 UNIT) PO CAPS: 50000.0000 [IU] | ORAL_CAPSULE | ORAL | 0 refills | Status: DC

## 2021-11-10 ENCOUNTER — Ambulatory Visit (INDEPENDENT_AMBULATORY_CARE_PROVIDER_SITE_OTHER): Payer: 59 | Admitting: Family Medicine

## 2021-11-15 NOTE — Progress Notes (Signed)
Chief Complaint:   OBESITY Dennis Castro is here to discuss his progress with his obesity treatment plan along with follow-up of his obesity related diagnoses. Dennis Castro is on the Category 4 Plan and states he is following his eating plan approximately 70% of the time. Dennis Castro states he is not currently exercising.  Today's visit was #: 2 Starting weight: 327 lbs Starting date: 10/26/21 Today's weight: 318 lbs Today's date: 11/09/21 Total lbs lost to date: 9 Total lbs lost since last in-office visit: 9  Interim History: He is down 9 pounds since his last visit.  He had some challenges with his shopping.  Subjective:   1. Insulin resistance Insulin is 25.4.  2. Vitamin D insufficiency Vitamin D is 33.8.  3. Other constipation Taking Metamucil.  Assessment/Plan:   1. Insulin resistance Handout-insulin resistance/prediabetes.  2. Vitamin D insufficiency Scription - Vitamin D, Ergocalciferol, (DRISDOL) 1.25 MG (50000 UNIT) CAPS capsule; Take 1 capsule (50,000 Units total) by mouth every 7 (seven) days.  Dispense: 5 capsule; Refill: 0  3. Other constipation 1.  Continue Metamucil. 2.  Increase vegetables 3.  Increase water.  4. Obesity, Current BMI 37.7 1.  Meal planning 2.  Intentional eating 3.  Reviewed labs 10/26/2021-CMP, lipid profile, vitamin D, A1c, and insulin.  Dennis Castro is currently in the action stage of change. As such, his goal is to continue with weight loss efforts. He has agreed to the Category 4 Plan.   Exercise goals: Join the gym.  (Exercise bike).  Behavioral modification strategies: increasing lean protein intake, decreasing simple carbohydrates, increasing vegetables, increasing water intake, decreasing eating out, no skipping meals, meal planning and cooking strategies, keeping healthy foods in the home, and planning for success.  Dennis Castro has agreed to follow-up with our clinic in 2-3 weeks. He was informed of the importance of frequent follow-up visits to  maximize his success with intensive lifestyle modifications for his multiple health conditions.   Objective:   Blood pressure 126/70, pulse 64, temperature 98.1 F (36.7 C), height 6\' 5"  (1.956 m), SpO2 97 %. Body mass index is 38.78 kg/m.  General: Cooperative, alert, well developed, in no acute distress. HEENT: Conjunctivae and lids unremarkable. Cardiovascular: Regular rhythm.  Lungs: Normal work of breathing. Neurologic: No focal deficits.   Lab Results  Component Value Date   CREATININE 0.97 10/26/2021   BUN 16 10/26/2021   NA 141 10/26/2021   K 4.4 10/26/2021   CL 104 10/26/2021   CO2 23 10/26/2021   Lab Results  Component Value Date   ALT 32 10/26/2021   AST 18 10/26/2021   ALKPHOS 88 10/26/2021   BILITOT 0.4 10/26/2021   Lab Results  Component Value Date   HGBA1C 5.5 10/26/2021   HGBA1C 5.4 04/22/2021   HGBA1C 5.5 03/05/2020   HGBA1C 5.9 01/14/2019   HGBA1C 5.7 07/10/2017   Lab Results  Component Value Date   INSULIN 29.4 (H) 10/26/2021   Lab Results  Component Value Date   TSH 1.670 10/26/2021   Lab Results  Component Value Date   CHOL 107 10/26/2021   HDL 41 10/26/2021   LDLCALC 48 10/26/2021   TRIG 94 10/26/2021   CHOLHDL 3.1 04/22/2021   Lab Results  Component Value Date   VD25OH 33.8 10/26/2021   VD25OH 38 04/22/2021   Lab Results  Component Value Date   WBC 8.0 07/19/2021   HGB 14.8 07/19/2021   HCT 43.5 07/19/2021   MCV 86 07/19/2021   PLT 268 07/19/2021  No results found for: "IRON", "TIBC", "FERRITIN"  Attestation Statements:   Reviewed by clinician on day of visit: allergies, medications, problem list, medical history, surgical history, family history, social history, and previous encounter notes.  I, Dawn Whitmire, FNP-C, am acting as transcriptionist for Dr. Jearld Lesch.  I have reviewed the above documentation for accuracy and completeness, and I agree with the above. Jearld Lesch, DO

## 2021-11-21 ENCOUNTER — Encounter (INDEPENDENT_AMBULATORY_CARE_PROVIDER_SITE_OTHER): Payer: Self-pay | Admitting: Bariatrics

## 2021-11-24 ENCOUNTER — Ambulatory Visit (INDEPENDENT_AMBULATORY_CARE_PROVIDER_SITE_OTHER): Payer: 59 | Admitting: Family Medicine

## 2021-12-08 ENCOUNTER — Ambulatory Visit (INDEPENDENT_AMBULATORY_CARE_PROVIDER_SITE_OTHER): Payer: 59 | Admitting: Bariatrics

## 2021-12-12 ENCOUNTER — Encounter: Payer: Self-pay | Admitting: Bariatrics

## 2021-12-12 ENCOUNTER — Ambulatory Visit: Payer: 59 | Admitting: Bariatrics

## 2021-12-12 VITALS — BP 107/68 | HR 71 | Temp 97.8°F | Ht 77.0 in | Wt 302.0 lb

## 2021-12-12 DIAGNOSIS — E669 Obesity, unspecified: Secondary | ICD-10-CM

## 2021-12-12 DIAGNOSIS — Z6835 Body mass index (BMI) 35.0-35.9, adult: Secondary | ICD-10-CM

## 2021-12-12 DIAGNOSIS — E559 Vitamin D deficiency, unspecified: Secondary | ICD-10-CM

## 2021-12-12 DIAGNOSIS — E88819 Insulin resistance, unspecified: Secondary | ICD-10-CM

## 2021-12-12 DIAGNOSIS — E8881 Metabolic syndrome: Secondary | ICD-10-CM

## 2021-12-12 DIAGNOSIS — E66812 Obesity, class 2: Secondary | ICD-10-CM

## 2021-12-12 MED ORDER — VITAMIN D (ERGOCALCIFEROL) 1.25 MG (50000 UNIT) PO CAPS: 50000.0000 [IU] | ORAL_CAPSULE | ORAL | 0 refills | Status: DC

## 2021-12-12 NOTE — Progress Notes (Deleted)
Chief Complaint:   OBESITY Dennis Castro is here to discuss his progress with his obesity treatment plan along with follow-up of his obesity related diagnoses.   Today's visit was #: 2 Starting weight: 327 lbs Starting date: 10/26/21 Today's weight:  Last Weight  Most recent update: 12/12/2021  3:27 PM    Weight  137 kg (302 lb)               Today's date: 12/12/2021 Weight change since last visit: 16 lbs  Total lbs lost to date: 25 lbs Body mass index is 35.81 kg/m.  Total weight loss percentage to date: ***  Current Meal Plan: the Category 4 Plan for 80% of the time.  Current Exercise Plan: Dennis Castro is going to the gym for 60 minutes 5-6 times per week. Current Anti-Obesity Medications: ***. Side effects: ***.  Interim History:***  Assessment/Plan:   1. Vitamin D insufficiency *** - Vitamin D, Ergocalciferol, (DRISDOL) 1.25 MG (50000 UNIT) CAPS capsule; Take 1 capsule (50,000 Units total) by mouth every 7 (seven) days.  Dispense: 5 capsule; Refill: 0  2. Insulin resistance ***  Course: Dennis Castro is currently in the action stage of change. As such, his goal is to continue with weight loss efforts.   Nutrition goals: He has agreed to {MWMwtlossportion/plan2:23431}.   Exercise goals: {MWM EXERCISE RECS:23473}  Behavioral modification strategies: {MWMwtlossdietstrategies3:23432}.  Dennis Castro has agreed to follow-up with our clinic in {NUMBER 1-10:22536} weeks. He was informed of the importance of frequent follow-up visits to maximize his success with intensive lifestyle modifications for his multiple health conditions.   ***delete paragraph if no labs orderedJoseph was informed we would discuss his lab results at his next visit unless there is a critical issue that needs to be addressed sooner. Dennis Castro agreed to keep his next visit at the agreed upon time to discuss these results.  Objective:   Vitals:   12/12/21 1500   BP: 107/68  Pulse: 71  Temp: 97.8 F (36.6 C)  Height: 6\' 5"  (1.956 m)  Weight: (!) 302 lb (137 kg)  SpO2: 93%  BMI (Calculated): 35.8     General: Cooperative, alert, well developed, in no acute distress. HEENT: Conjunctivae and lids unremarkable. Cardiovascular: Regular rhythm.  Lungs: Normal work of breathing. Neurologic: No focal deficits.   Lab Results  Component Value Date   CREATININE 0.97 10/26/2021   BUN 16 10/26/2021   NA 141 10/26/2021   K 4.4 10/26/2021   CL 104 10/26/2021   CO2 23 10/26/2021   Lab Results  Component Value Date   ALT 32 10/26/2021   AST 18 10/26/2021   ALKPHOS 88 10/26/2021   BILITOT 0.4 10/26/2021   Lab Results  Component Value Date   HGBA1C 5.5 10/26/2021   HGBA1C 5.4 04/22/2021   HGBA1C 5.5 03/05/2020   HGBA1C 5.9 01/14/2019   HGBA1C 5.7 07/10/2017   Lab Results  Component Value Date   INSULIN 29.4 (H) 10/26/2021   Lab Results  Component Value Date   TSH 1.670 10/26/2021   Lab Results  Component Value Date   CHOL 107 10/26/2021   HDL 41 10/26/2021   LDLCALC 48 10/26/2021  TRIG 94 10/26/2021   CHOLHDL 3.1 04/22/2021   Lab Results  Component Value Date   VD25OH 33.8 10/26/2021   VD25OH 38 04/22/2021   Lab Results  Component Value Date   WBC 8.0 07/19/2021   HGB 14.8 07/19/2021   HCT 43.5 07/19/2021   MCV 86 07/19/2021   PLT 268 07/19/2021   No results found for: "IRON", "TIBC", "FERRITIN"  Obesity Behavioral Intervention:   Approximately 15 minutes were spent on the discussion below.  ASK: We discussed the diagnosis of obesity with Dennis Castro today and Dennis Castro agreed to give [place] permission to discuss obesity behavioral modification therapy today.  ASSESS: Dennis Castro has the diagnosis of obesity and his BMI today is ***. Dennis Castro {ACTION; IS/IS [id number]} in the action stage of change.   ADVISE: Dennis Castro was educated on the multiple health risks of obesity as well as the benefit of weight loss to improve his  health. He was advised of the need for long term treatment and the importance of lifestyle modifications to improve his current health and to decrease his risk of future health problems.  AGREE: Multiple dietary modification options and treatment options were discussed and Dennis Castro agreed to follow the recommendations documented in the above note.  ARRANGE: Dennis Castro was educated on the importance of frequent visits to treat obesity as outlined per CMS and USPSTF guidelines and agreed to schedule his next follow up appointment today.  Attestation Statements:   Reviewed by clinician on day of visit: allergies, medications, problem list, medical history, surgical history, family history, social history, and previous encounter notes.  ***(delete if time-based billing not used)Time spent on visit including pre-visit chart review and post-visit care and charting was *** minutes.   I, ***, am acting as transcriptionist for ***.  I have reviewed the above documentation for accuracy and completeness, and I agree with the above. -  ***

## 2021-12-15 ENCOUNTER — Encounter: Payer: Self-pay | Admitting: Adult Health

## 2021-12-15 ENCOUNTER — Ambulatory Visit: Payer: 59 | Admitting: Adult Health

## 2021-12-15 VITALS — BP 119/75 | HR 56 | Ht 77.0 in | Wt 306.0 lb

## 2021-12-15 DIAGNOSIS — G609 Hereditary and idiopathic neuropathy, unspecified: Secondary | ICD-10-CM | POA: Diagnosis not present

## 2021-12-15 NOTE — Progress Notes (Signed)
[facility]    Provider:  Dr Dennis Castro Requesting Provider: Colon Branch, MD Primary Care Provider:  Colon Branch, MD   CC:  burning on bottom of fet Chief Complaint  Patient presents with   Burning sensation of feet    Rm 3, 6 month FU "symptoms are better, still burning on bottom of feet sometimes and they feel cold to the touch at times"       HPI:   Update 12/15/2021 JM: patient returns for 6 month follow up unaccompanied.   Notes some improvement of symptoms since prior visit. Will have occasional burning. Sometimes at night, feet can be cold sensation but has been getting better. Denies overly bothersome symptoms or pain.  Does not interfere with sleep.  He has been using alpha lipoic acid, believes taking 241m per day  Started working with healthy weight and wellness back in July, has lost 25 lbs to date!! He believes between the weight loss, healthy eating and going to the gym 5 times per week has been greatly beneficial.  06/2021 Labs: B12 381, B6 4.3, B1 182.6, multiple myeloma WNL      Consult visit 06/13/2021 Dr. AJaynee Castro  Dennis Castro is a 61y.o. male here as requested by PColon Branch MD for lower extremity paresthesias. PMHx CAD, HLD, morbid obesity, ED, anxiety, arthritis, mild hyperglycemia.  I reviewed Dr. PEthel Castro: He has morbid obesity, this is an independent risk factor for neuropathy, he has burning feet at both plantar areas, going on for about a year, worse at night, he was seen with acute low back pain recently but that is better.  He started walking last summer and started noticing issues with his feet not necessarily sensory changes. When he would walk they hurt, he had plantar fasciitis, at some point he noticed burning in the feet gradually worsening. It burns, tingles, from the mid to forefoot. Notices it more at night, noticing some more during the day. Walking makes it worse, laying in bed feel it more. No weakness. He has had  some low back pain recently. He generally has pain in his low back. No imbalance. No cramping in the feet. In the toes. No alcohol use. No new medications since this started. Mother had diabetes and neuropathy. Symmetrical. No other focal neurologic deficits, associated symptoms, inciting events or modifiable factors.  Reviewed notes, labs and imaging from outside physicians, which showed:  04/22/2021: Vit D 38, tSH nml, hgba1c 5.4, B12 337    Review of Systems: Patient complains of symptoms per HPI as well as the following symptoms burning. Pertinent negatives and positives per HPI. All others negative.   Social History   Socioeconomic History   Marital status: Married    Spouse name: Dennis Castro  Number of children: 1   Years of education: Not on file   Highest education level: Not on file  Occupational History   Occupation:Astronomer office job  Tobacco Use   Smoking status: Never   Smokeless tobacco: Never  Vaping Use   Vaping Use: Never used  Substance and Sexual Activity   Alcohol use: No   Drug use: No   Sexual activity: Yes  Other Topics Concern   Not on file  Social History Narrative   Teenage son [name] still at home   Social Determinants of Health   Financial Resource Strain: Not on file  Food Insecurity: Not on file  Transportation Needs: Not on file  Physical Activity: Not  on file  Stress: Not on file  Social Connections: Not on file  Intimate Partner Violence: Not on file    Family History  Problem Relation Age of Onset   Diabetes Mother 66   CAD Mother 36       CBAG   Dennis cancer Mother    High blood pressure Mother    Heart disease Mother    Cancer Father        prostate cancer and panceatic cancer   Prostate cancer Father    Breast cancer Maternal Grandmother    Pancreatic cancer Paternal Grandfather    Stroke Neg Hx    Esophageal cancer Neg Hx    Rectal cancer Neg Hx    Stomach cancer Neg Hx    Neuropathy Neg Hx     Past Medical History:   Diagnosis Date   Anxiety    Arthritis    Atypical chest pain    Back pain    Cholelithiasis 09/2015   determined by CT, asymptomatic   Constipation    Coronary artery disease    Diverticulitis    Family history of heart disease    GERD (gastroesophageal reflux disease)    diet related   Hyperlipidemia    IBS (irritable bowel syndrome)    Joint pain    Seasonal allergies    Swelling of lower extremity     Patient Active Problem List   Diagnosis Date Noted   Vitamin D insufficiency 11/09/2021   Other constipation 11/09/2021   Insulin resistance 10/27/2021   Other fatigue 10/26/2021   SOB (shortness of breath) on exertion 10/26/2021   Hyperglycemia, unspecified 10/26/2021   Vitamin D deficiency 10/26/2021   Class 2 severe obesity with serious comorbidity and body mass index (BMI) of 38.0 to 38.9 in adult Dennis Castro) 10/26/2021   Neuropathy 08/26/2021   Post-cholecystectomy syndrome 04/24/2021   Anxiety 07/10/2019   Family history of pancreatic cancer 01/15/2018   Coronary artery disease 08/10/2017   Left ankle pain 07/20/2017   Erectile dysfunction 03/21/2017   Morbid obesity (Dennis Castro) 07/20/2015   Annual physical exam 01/17/2015   PCP NOTES >>> 01/17/2015   Family history of prostate cancer 03/25/2012   GERD 04/01/2010   Hyperlipidemia 10/07/2007    Past Surgical History:  Procedure Laterality Date   CARDIAC CATHETERIZATION  04/03/2008   Dr. Gwenlyn Castro; less than 30% occlusive disease   CHOLECYSTECTOMY N/A 06/14/2018   Procedure: LAPAROSCOPIC CHOLECYSTECTOMY;  Surgeon: Dennis Ok, MD;  Location: [facility] OR;  Service: General;  Laterality: N/A;   CT CTA CORONARY W/CA SCORE W/CM &/OR WO/CM  08/04/2021   CT FFR analysis showed flow limitations in the  distal tip of the LAD.Coronary calcium score of 212   INSERTION OF MESH  12/13/2018   Procedure: Insertion Of Mesh;  Surgeon: Dennis Ok, MD;  Location: [facility];  Service: General;;   TONSILLECTOMY     age 17   TRANSTHORACIC  ECHOCARDIOGRAM  08/02/2021   EF 55-60%,Grade I DD,mild LVH,mild aortic dialation of 22QM   UMBILICAL HERNIA REPAIR N/A 12/13/2018   Procedure: LAPAROSCOPIC UMBILICAL HERNIA REPAIR;  Surgeon: Dennis Ok, MD;  Location: [facility];  Service: General;  Laterality: N/A;    Current Outpatient Medications  Medication Sig Dispense Refill   Alpha-Lipoic Acid 100 MG CAPS Take 200 mg by mouth daily.     aspirin EC 81 MG tablet Take 81 mg by mouth daily.     atorvastatin (LIPITOR) 20 MG tablet Take 1 tablet (20 mg total)  by mouth at bedtime. 90 tablet 1   colestipol (COLESTID) 1 g tablet Take 1 tablet (1 g total) by mouth 2 (two) times daily. (Patient taking differently: Take 1 g by mouth 2 (two) times daily as needed.) 180 tablet 3   methylcellulose (CITRUCEL) oral powder If needed     metoprolol tartrate (LOPRESSOR) 25 MG tablet Take 1/2 tablet 12.5 mg twice a day 90 tablet 3   Multiple Vitamin (MULTIVITAMIN WITH MINERALS) TABS tablet Take 1 tablet by mouth daily.     Omega-3 Fatty Acids (FISH OIL) 1000 MG CAPS Take 2 capsules (2,000 mg total) by mouth daily. 60 capsule 12   sildenafil (REVATIO) 20 MG tablet Take 3-4 tablets (60-80 mg total) by mouth at bedtime as needed. 30 tablet 3   Vitamin D, Ergocalciferol, (DRISDOL) 1.25 MG (50000 UNIT) CAPS capsule Take 1 capsule (50,000 Units total) by mouth every 7 (seven) days. 5 capsule 0   No current facility-administered medications for this visit.    Allergies as of 12/15/2021   (No Known Allergies)    Vitals: BP 119/75   Pulse (!) 56   Ht _0  (1.956 m)   Wt (!) 306 lb (138.8 kg)   BMI 36.29 kg/m  Last Weight:  Wt Readings from Last 1 Encounters:  12/15/21 (!) 306 lb (138.8 kg)   Last Height:   Ht Readings from Last 1 Encounters:  12/15/21 _1  (1.956 m)     Physical exam: Exam: Gen: NAD, conversant, well nourised, morbidly obese, well groomed                     CV: RRR, no MRG. No Carotid Bruits. slight peripheral edema,  distally feet feel cold, nontender, distal slight rubor in the lower legs Eyes: Conjunctivae clear without exudates or hemorrhage  Neuro: Detailed Neurologic Exam  Speech:    Speech is normal; fluent and spontaneous with normal comprehension.  Cognition:    The patient is oriented to person, place, and time;     recent and remote memory intact;     language fluent;     normal attention, concentration,     fund of knowledge Cranial Nerves:    The pupils are equal, round, and reactive to light.  Visual fields are full to finger confrontation. Extraocular movements are intact. Trigeminal sensation is intact and the muscles of mastication are normal. The face is symmetric. The palate elevates in the midline. Hearing intact. Voice is normal. Shoulder shrug is normal. The tongue has normal motion without fasciculations.   Coordination:    Normal   Gait:    Heel-toe and tandem gait are normal.   Motor Observation:    No asymmetry, no atrophy, and no involuntary movements noted. Tone:    Normal muscle tone.    Posture:    Posture is normal. normal erect    Strength:    Strength is V/V in the upper and lower limbs.      Sensation: decreased pin prick great toe, decreased temp in a distal to prox gradient, diminished vibration L>R at the great toes.  Position sense intact.     Reflex Exam:  DTR's:    Deep tendon reflexes in the upper and lower extremities are normal bilaterally.   Toes:    The toes are downgoing bilaterally.   Clonus:    Clonus is absent.       Assessment/Plan:  61 y.o. male here as a referral from Dr. Larose Kells for  peripheral neuropathy. He reports slowly progressive burning, tingling  bottom of feet and great toe started in the past year ago likely due to peripheral neuropathy as his symptoms are distal predominant, symmetrical, slowly progressive, and an ascending pattern with decreased sensation in small and large fibers in a gradient fashion.  Symptoms have been  gradually improving since prior visit   -Continue with weight loss, being followed by healthy weight and wellness since July, has lost 25 pounds today -Discussed importance of continuing healthy diet as some foods can make symptoms worse and continued weight loss as this can help improve symptoms -Continue alpha lipoic acid -recommend increasing to 670m daily -Concern of possible prediabetes, prior A1c 5.5, has been making dietary changes - slight distal peripheral edema, distally feet feel cold to the touch, has CAD, distal slight rubor in the lower legs: May suggest Dr. PLarose Kellscheck vascular exam or ABI as clinically warranted. -Vitamin B12 deficiency - recommend initiating B12 1000 mcg daily -If progresses will order EMG/NCS and can consider genetic testing (mother had neuropathy) but as symptoms stable and actually improving, will hold off on further testing    Follow-up in 6 months or call earlier if needed     CC:  PColon Branch MD   I spent 31 minutes of face-to-face and non-face-to-face time with patient.  This included previsit chart review, lab review, study review, order entry, electronic health record documentation, patient education and discussion regarding peripheral neuropathy and treatment plan and answered all other questions to patient satisfaction  JFrann Rider AHers[facility] [address] [address] Phone [phone] [phone]: This document was prepared with digital dictation and possible smart phrase technology. Any transcriptional errors that result from this process are unintentional.

## 2021-12-15 NOTE — Patient Instructions (Addendum)
Continue alpha lipoic acid - try to take closer to 600mg  daily  Start Vit B12 supplement daily   Continue to follow with healthy weight and wellness as advised - keep up the good work!!    Follow up in 6 months or call earlier if needed

## 2021-12-21 NOTE — Progress Notes (Unsigned)
Chief Complaint:   OBESITY Dennis Castro is here to discuss his progress with his obesity treatment plan along with follow-up of his obesity related diagnoses. Dennis Castro is on the Category 4 Plan and states he is following his eating plan approximately 80% of the time. Dennis Castro states he is at the gym for 60 minutes 5-6 times per week.  Today's visit was #: 3 Starting weight: 327 lbs Starting date: 10/26/2021 Today's weight: 302 lbs Today's date: 12/12/2021 Total lbs lost to date: 25 Total lbs lost since last in-office visit: 16  Interim History: Dennis Castro is down 16 lbs since his last visit. He is trying to get under 300 lbs. He is hitting his calorie and protein goals.  Subjective:   1. Insulin resistance Dennis Castro's appetite is well controlled, and he denies sugar cravings.   2. Vitamin D insufficiency Dennis Castro is taking Vitamin D.  Assessment/Plan:   1. Insulin resistance Dennis Castro will continue to decrease carbohydrates (sugar and sweets), and he will have food on hand.   2. Vitamin D insufficiency We will refill prescription Vitamin D for 1 month. Dennis Castro will follow-up for routine testing of Vitamin D, at least 2-3 times per year to avoid over-replacement.  - Vitamin D, Ergocalciferol, (DRISDOL) 1.25 MG (50000 UNIT) CAPS capsule; Take 1 capsule (50,000 Units total) by mouth every 7 (seven) days.  Dispense: 5 capsule; Refill: 0  3. Obesity with current BMI of 35.8 Dennis Castro is currently in the action stage of change. As such, his goal is to continue with weight loss efforts. He has agreed to the Category 4 Plan and keeping a food journal and adhering to recommended goals of 1800 calories and 120 grams of protein daily.   Exercise goals: As is.  Behavioral modification strategies: increasing lean protein intake, decreasing simple carbohydrates, increasing vegetables, increasing water intake, decreasing eating out, no skipping meals, meal planning and cooking strategies, keeping healthy foods in  the home, and planning for success.  Dennis Castro has agreed to follow-up with our clinic in 3 to 4 weeks. He was informed of the importance of frequent follow-up visits to maximize his success with intensive lifestyle modifications for his multiple health conditions.   Objective:   Blood pressure 107/68, pulse 71, temperature 97.8 F (36.6 C), height 6\' 5"  (1.956 m), weight (!) 302 lb (137 kg), SpO2 93 %. Body mass index is 35.81 kg/m.  General: Cooperative, alert, well developed, in no acute distress. HEENT: Conjunctivae and lids unremarkable. Cardiovascular: Regular rhythm.  Lungs: Normal work of breathing. Neurologic: No focal deficits.   Lab Results  Component Value Date   CREATININE 0.97 10/26/2021   BUN 16 10/26/2021   NA 141 10/26/2021   K 4.4 10/26/2021   CL 104 10/26/2021   CO2 23 10/26/2021   Lab Results  Component Value Date   ALT 32 10/26/2021   AST 18 10/26/2021   ALKPHOS 88 10/26/2021   BILITOT 0.4 10/26/2021   Lab Results  Component Value Date   HGBA1C 5.5 10/26/2021   HGBA1C 5.4 04/22/2021   HGBA1C 5.5 03/05/2020   HGBA1C 5.9 01/14/2019   HGBA1C 5.7 07/10/2017   Lab Results  Component Value Date   INSULIN 29.4 (H) 10/26/2021   Lab Results  Component Value Date   TSH 1.670 10/26/2021   Lab Results  Component Value Date   CHOL 107 10/26/2021   HDL 41 10/26/2021   LDLCALC 48 10/26/2021   TRIG 94 10/26/2021   CHOLHDL 3.1 04/22/2021   Lab Results  Component Value Date   VD25OH 33.8 10/26/2021   VD25OH 38 04/22/2021   Lab Results  Component Value Date   WBC 8.0 07/19/2021   HGB 14.8 07/19/2021   HCT 43.5 07/19/2021   MCV 86 07/19/2021   PLT 268 07/19/2021   No results found for: "IRON", "TIBC", "FERRITIN"  Attestation Statements:   Reviewed by clinician on day of visit: allergies, medications, problem list, medical history, surgical history, family history, social history, and previous encounter notes.   Trude Mcburney, am acting as  Energy manager for [employer], DO.  I have reviewed the above documentation for accuracy and completeness, and I agree with the above. Corinna Capra, DO

## 2021-12-22 ENCOUNTER — Encounter: Payer: Self-pay | Admitting: Bariatrics

## 2022-01-03 ENCOUNTER — Ambulatory Visit: Payer: 59 | Attending: Nurse Practitioner

## 2022-01-03 DIAGNOSIS — I77810 Thoracic aortic ectasia: Secondary | ICD-10-CM | POA: Diagnosis present

## 2022-01-03 DIAGNOSIS — I7121 Aneurysm of the ascending aorta, without rupture: Secondary | ICD-10-CM | POA: Diagnosis present

## 2022-01-03 DIAGNOSIS — I2510 Atherosclerotic heart disease of native coronary artery without angina pectoris: Secondary | ICD-10-CM | POA: Insufficient documentation

## 2022-01-03 DIAGNOSIS — I712 Thoracic aortic aneurysm, without rupture, unspecified: Secondary | ICD-10-CM

## 2022-01-03 DIAGNOSIS — E782 Mixed hyperlipidemia: Secondary | ICD-10-CM | POA: Insufficient documentation

## 2022-01-03 DIAGNOSIS — I719 Aortic aneurysm of unspecified site, without rupture: Secondary | ICD-10-CM | POA: Insufficient documentation

## 2022-01-03 LAB — ECHOCARDIOGRAM COMPLETE
Area-P 1/2: 3.03 cm2
S' Lateral: 3.2 cm

## 2022-01-04 ENCOUNTER — Ambulatory Visit: Payer: 59 | Admitting: Cardiovascular Disease

## 2022-01-04 ENCOUNTER — Telehealth: Payer: Self-pay

## 2022-01-04 ENCOUNTER — Encounter: Payer: Self-pay | Admitting: Cardiovascular Disease

## 2022-01-04 DIAGNOSIS — I2510 Atherosclerotic heart disease of native coronary artery without angina pectoris: Secondary | ICD-10-CM | POA: Diagnosis not present

## 2022-01-04 DIAGNOSIS — I7121 Aneurysm of the ascending aorta, without rupture: Secondary | ICD-10-CM | POA: Diagnosis not present

## 2022-01-04 DIAGNOSIS — I712 Thoracic aortic aneurysm, without rupture, unspecified: Secondary | ICD-10-CM | POA: Insufficient documentation

## 2022-01-04 DIAGNOSIS — E782 Mixed hyperlipidemia: Secondary | ICD-10-CM

## 2022-01-04 NOTE — Assessment & Plan Note (Signed)
History of morbid obesity with a BMI of 34.  He did go to the [facility] and wellness center and is on 1800-calorie diet.  He works out in [employer] 4 to 5 days a week.  He is weight 290 pounds today with a goal of 220.

## 2022-01-04 NOTE — Assessment & Plan Note (Signed)
History of nonobstructive CAD by cardiac catheterization which I performed 06/20/2009.  He is completely asymptomatic.  He did have a coronary calcium score performed 5//23 which was 212 majority of which was in the LAD.  Subsequent FFR analysis did not show physiologic significance.

## 2022-01-04 NOTE — Assessment & Plan Note (Signed)
History of hyperlipidemia on statin therapy with lipid profile performed 10/26/2021 revealing a total cholesterol of 107, LDL 48 and HDL 41.

## 2022-01-04 NOTE — Telephone Encounter (Signed)
Routed to [employer] for follow up with MD

## 2022-01-04 NOTE — Telephone Encounter (Signed)
Patient returned RN's call. 

## 2022-01-04 NOTE — Telephone Encounter (Signed)
I spoke with patient. He saw Dr Gwenlyn Found today.  Patient requested to hear comments from Ambrose Pancoast, NP also regarding echo.  Comments reviewed with patient. Patient reports he recently starting lifting weights.  He would like Dr Kennon Holter advice regarding any weight  lifting restrictions.

## 2022-01-04 NOTE — Assessment & Plan Note (Signed)
Thoracic aorta by 2D echo performed yesterday measured 43 mm.  We will repeat 2D echo in 1 year.

## 2022-01-04 NOTE — Telephone Encounter (Signed)
Called patient to share test results, no answer.  Called spouse, Ms. Roselyn Reef, wife stated he was at another doctors office visit this morning.   Ms. Roselyn Reef will have her husband call us back later this morning.  Follow up regarding Madaline Guthrie note required.

## 2022-01-04 NOTE — Progress Notes (Signed)
01/04/2022 Dennis Castro.   March 28, 1961  [id number]  Primary Physician Wanda Plump, MD Primary Cardiologist: Runell Gess MD Milagros Loll, [place], [place]  HPI:  Dennis Castro. is a 61 y.o.  severely overweight married Caucasian male father of one  Who works at [employer] in [place]. I last saw him in the office 09/23/2019.  He has seen Robin Searing, PA-C since that time.[name] He was having atypical chest pain and had a Myoview stress test that showed subtle anterior ischemia. Based on this he underwent cardiac catheterization by myself 06/24/09 revealing noncritical CAD with at most a 30% hypodense segmental mid LAD stent stenosis with very subtle anteroapical hypokinesia. Medical therapy is recommended. He continued to have atypical chest pain. He does have a family history of heart disease with a mother who had stents in her 53s and bypass surgery at 26.   He was seen in the ER 07/05/2017 with atypical chest pain.  His EKG showed no acute changes and he ruled out by enzymes.  He had no recurrent symptoms.   He had gone on a diet and exercise campaign and is lost 45 pounds down from 335--->290 #.  He felt clinically improved.  Unfortunately, he has gained all the weight back for various reasons.   He had a hernia repair and a gallbladder removed.  There are no symptoms compatible with obstructive sleep apnea.  Since I saw him over 2 years ago he is remained stable.  He continues to be on 1800-calorie diet and works out at [employer] 4 to 5 days a week.  He did have a 2D echocardiogram performed 01/03/2022 that was essentially normal except for an ascending thoracic aorta measuring 43 mm which we will need to follow on annual basis.  A coronary calcium score performed 5//23 was 212 the majority of which was in the LAD territory.  Subsequent FFR analysis did not show physiologic significance.   Current Meds  Medication Sig   Alpha-Lipoic Acid 100 MG CAPS Take 200 mg by mouth  daily.   aspirin EC 81 MG tablet Take 81 mg by mouth daily.   atorvastatin (LIPITOR) 20 MG tablet Take 1 tablet (20 mg total) by mouth at bedtime.   colestipol (COLESTID) 1 g tablet Take 1 tablet (1 g total) by mouth 2 (two) times daily. (Patient taking differently: Take 1 g by mouth 2 (two) times daily as needed.)   methylcellulose (CITRUCEL) oral powder If needed   metoprolol tartrate (LOPRESSOR) 25 MG tablet Take 1/2 tablet 12.5 mg twice a day   Multiple Vitamin (MULTIVITAMIN WITH MINERALS) TABS tablet Take 1 tablet by mouth daily.   Omega-3 Fatty Acids (FISH OIL) 1000 MG CAPS Take 2 capsules (2,000 mg total) by mouth daily.   sildenafil (REVATIO) 20 MG tablet Take 3-4 tablets (60-80 mg total) by mouth at bedtime as needed.   Vitamin D, Ergocalciferol, (DRISDOL) 1.25 MG (50000 UNIT) CAPS capsule Take 1 capsule (50,000 Units total) by mouth every 7 (seven) days.     No Known Allergies  Social History   Socioeconomic History   Marital status: Married    Spouse name: [name]   Number of children: 1   Years of education: Not on file   Highest education level: Not on file  Occupational History   OccupationHydrologist, office job  Tobacco Use   Smoking status: Never   Smokeless tobacco: Never  Vaping Use   Vaping Use: Never used  Substance and Sexual Activity   Alcohol use: No   Drug use: No   Sexual activity: Yes  Other Topics Concern   Not on file  Social History Narrative   Teenage son [name], still at home   Social Determinants of Health   Financial Resource Strain: Not on file  Food Insecurity: Not on file  Transportation Needs: Not on file  Physical Activity: Not on file  Stress: Not on file  Social Connections: Not on file  Intimate Partner Violence: Not on file     Review of Systems: General: negative for chills, fever, night sweats or weight changes.  Cardiovascular: negative for chest pain, dyspnea on exertion, edema, orthopnea, palpitations, paroxysmal nocturnal  dyspnea or shortness of breath Dermatological: negative for rash Respiratory: negative for cough or wheezing Urologic: negative for hematuria Abdominal: negative for nausea, vomiting, diarrhea, bright red blood per rectum, melena, or hematemesis Neurologic: negative for visual changes, syncope, or dizziness All other systems reviewed and are otherwise negative except as noted above.    Blood pressure 108/64, pulse 60, height 6\' 5"  (1.956 m), weight 290 lb (131.5 kg).  General appearance: alert and no distress Neck: no adenopathy, no carotid bruit, no JVD, supple, symmetrical, trachea midline, and thyroid not enlarged, symmetric, no tenderness/mass/nodules Lungs: clear to auscultation bilaterally Heart: regular rate and rhythm, S1, S2 normal, no murmur, click, rub or gallop Extremities: extremities normal, atraumatic, no cyanosis or edema Pulses: 2+ and symmetric Skin: Skin color, texture, turgor normal. No rashes or lesions Neurologic: Grossly normal  EKG sinus rhythm at 60 without ST or T wave changes.  Personally reviewed this EKG.  ASSESSMENT AND PLAN:   Hyperlipidemia History of hyperlipidemia on statin therapy with lipid profile performed 10/26/2021 revealing a total cholesterol of 107, LDL 48 and HDL 41.  Morbid obesity (HCC) History of morbid obesity with a BMI of 34.  He did go to the [facility] and wellness center and is on 1800-calorie diet.  He works out in [employer] 4 to 5 days a week.  He is weight 290 pounds today with a goal of 220.  Coronary artery disease History of nonobstructive CAD by cardiac catheterization which I performed 06/20/2009.  He is completely asymptomatic.  He did have a coronary calcium score performed 5//23 which was 212 majority of which was in the LAD.  Subsequent FFR analysis did not show physiologic significance.  Thoracic aortic aneurysm [facility]) Thoracic aorta by 2D echo performed yesterday measured 43 mm.  We will repeat 2D echo in 1  year.     Runell Gess MD FACP,FACC,FAHA, [facility] 01/04/2022 11:13 AM

## 2022-01-04 NOTE — Patient Instructions (Addendum)
Medication Instructions:  Your physician recommends that you continue on your current medications as directed. Please refer to the Current Medication list given to you today.  *If you need a refill on your cardiac medications before your next appointment, please call your pharmacy*    Testing/Procedures: Your physician has requested that you have an echocardiogram. Echocardiography is a painless test that uses sound waves to create images of your heart. It provides your doctor with information about the size and shape of your heart and how well your heart's chambers and valves are working. This procedure takes approximately one hour. There are no restrictions for this procedure. To be done in September 2024. This procedure will be done at [address]    Follow-Up: At [facility], you and your health needs are our priority.  As part of our continuing mission to provide you with exceptional heart care, we have created designated Provider Care Teams.  These Care Teams include your primary Cardiologist (physician) and Advanced Practice Providers (APPs -  Physician Assistants and Nurse Practitioners) who all work together to provide you with the care you need, when you need it.  We recommend signing up for the patient portal called "MyChart".  Sign up information is provided on this After Visit Summary.  MyChart is used to connect with patients for Virtual Visits (Telemedicine).  Patients are able to view lab/test results, encounter notes, upcoming appointments, etc.  Non-urgent messages can be sent to your provider as well.   To learn more about what you can do with MyChart, go to [email or web address].    Your next appointment:   12 month(s)  The format for your next appointment:   In Person  Provider:   Quay Burow, MD

## 2022-01-04 NOTE — Telephone Encounter (Signed)
-----   Message from Marylu Lund., NP sent at 01/03/2022  5:45 PM EDT ----- Please let Dennis Castro know that his echo looks good and heart pumping function is normal at 55-60%. There were no wall motion abnormalities to suggest concern for abnormal blood flow. There was normal valve function with no evidence of leaking.  Your ascending aortic aneurysm has increased slightly from 43 mm to 44 mm.  We will continue to monitor with CT scans annually.  The best way to control progression of aneurysm is to keep blood pressure well controlled and avoid strenuous lifting or exercise.  Please let me know if you have any additional questions.  Ambrose Pancoast, NP

## 2022-01-06 NOTE — Telephone Encounter (Signed)
Patient called to follow-up on his results.  Patient stated he would like clarification on what are his restrictions because of his aneurysm.  Patient wants to know if he can continue to lift weights and any other restrictions.

## 2022-01-06 NOTE — Telephone Encounter (Signed)
I contacted patient to advise of message from [facility].   He was very upset- stating he has spoken with 4 different nurses and nobody is giving him the answer he needs.   He states he was just seen on 10/04- and nothing was mentioned about an aneurysm- he is worried and needs to know what this means, and what exactly is "heavy lifting" what are the pounds that would be considered heavy. He needs specific details. He has requested Dr.Berry call him to discuss- I advised I would send a message.   He states "this is just another message to be sent".  I tried to explain to patient I was just in the middle of this conversation but would do what I could to assist him.   Will send to MD to clarify further on specific needs from patient.

## 2022-01-06 NOTE — Telephone Encounter (Signed)
Lorretta Harp, MD  You; Caprice Beaver, LPN 1 hour ago (9:40 PM)    The ascending thoracic Aortic dilatation is mild (44 mm). We don't start worrying about it until it's 50-55 mm and it may never get there. We will get a 2d echo annually to assess. There are no guidelines to my knowledge that specifies how much is too much but typically we tell people not to lift excessive weights that would increase intra thoracic pressure. Right now he is at LOW risk.    Spoke with pt regarding Dr. Kennon Holter recommendations. Pt verbalizes understanding and is thankful for the call back.

## 2022-01-11 ENCOUNTER — Ambulatory Visit: Payer: 59 | Admitting: Bariatrics

## 2022-01-11 ENCOUNTER — Encounter: Payer: Self-pay | Admitting: Bariatrics

## 2022-01-11 VITALS — BP 106/67 | HR 54 | Temp 98.0°F | Ht 77.0 in | Wt 296.0 lb

## 2022-01-11 DIAGNOSIS — E669 Obesity, unspecified: Secondary | ICD-10-CM

## 2022-01-11 DIAGNOSIS — Z6835 Body mass index (BMI) 35.0-35.9, adult: Secondary | ICD-10-CM

## 2022-01-11 DIAGNOSIS — E559 Vitamin D deficiency, unspecified: Secondary | ICD-10-CM

## 2022-01-11 DIAGNOSIS — Z6838 Body mass index (BMI) 38.0-38.9, adult: Secondary | ICD-10-CM

## 2022-01-11 DIAGNOSIS — E782 Mixed hyperlipidemia: Secondary | ICD-10-CM | POA: Diagnosis not present

## 2022-01-11 MED ORDER — VITAMIN D (ERGOCALCIFEROL) 1.25 MG (50000 UNIT) PO CAPS: 50000.0000 [IU] | ORAL_CAPSULE | ORAL | 0 refills | Status: DC

## 2022-01-16 ENCOUNTER — Encounter: Payer: Self-pay | Admitting: Bariatrics

## 2022-01-16 NOTE — Progress Notes (Signed)
Chief Complaint:   OBESITY Dennis Castro is here to discuss his progress with his obesity treatment plan along with follow-up of his obesity related diagnoses. Dennis Castro is on the Category 4 Plan and keeping a food journal and adhering to recommended goals of 1800 calories and 120 grams of protein daily and states he is following his eating plan approximately 65-70% of the time. Dennis Castro states he is doing weights and cardio for 30-40 minutes 4 times per week.  Today's visit was #: 4 Starting weight: 327 lbs Starting date: 10/26/2021 Today's weight: 296 lbs Today's date: 01/11/2022 Total lbs lost to date: 31 Total lbs lost since last in-office visit: 6  Interim History: Dennis Castro is down an additional 6 lbs since his last visit and he is doing well overall. He is eating more calories in the evening.   Subjective:   1. Vitamin D insufficiency Dennis Castro is taking Vitamin D as directed, and he notes increased energy.   2. Mixed hyperlipidemia Dennis Castro is taking Omega 3 and Lipitor.   Assessment/Plan:   1. Vitamin D insufficiency Dennis Castro will continue prescription Vitamin D 50,000 IU every week, and we will refill for 1 month. He will follow-up for routine testing of Vitamin D, at least 2-3 times per year to avoid over-replacement.  - Vitamin D, Ergocalciferol, (DRISDOL) 1.25 MG (50000 UNIT) CAPS capsule; Take 1 capsule (50,000 Units total) by mouth every 7 (seven) days.  Dispense: 5 capsule; Refill: 0  2. Mixed hyperlipidemia Dennis Castro is to eliminate trans fats, and continue his meal plan. We will continue to follow over time.   3. Obesity with current BMI of 35.2 Dennis Castro is currently in the action stage of change. As such, his goal is to continue with weight loss efforts. He has agreed to the Category 4 Plan.   He will adhere closely to the plan 80-90%. Decrease carbohydrates. Keep protein high at 150 grams, and increase healthy fats.   We will recheck fasting labs at his next visit.  Exercise  goals: As is.   Behavioral modification strategies: increasing lean protein intake, decreasing simple carbohydrates, increasing vegetables, increasing water intake, decreasing eating out, no skipping meals, meal planning and cooking strategies, keeping healthy foods in the home, and planning for success.  Dennis Castro has agreed to follow-up with our clinic in 3 weeks. He was informed of the importance of frequent follow-up visits to maximize his success with intensive lifestyle modifications for his multiple health conditions.   Objective:   Blood pressure 106/67, pulse (!) 54, temperature 98 F (36.7 C), height 6\' 5"  (1.956 m), weight 296 lb (134.3 kg), SpO2 96 %. Body mass index is 35.1 kg/m.  General: Cooperative, alert, well developed, in no acute distress. HEENT: Conjunctivae and lids unremarkable. Cardiovascular: Regular rhythm.  Lungs: Normal work of breathing. Neurologic: No focal deficits.   Lab Results  Component Value Date   CREATININE 0.97 10/26/2021   BUN 16 10/26/2021   NA 141 10/26/2021   K 4.4 10/26/2021   CL 104 10/26/2021   CO2 23 10/26/2021   Lab Results  Component Value Date   ALT 32 10/26/2021   AST 18 10/26/2021   ALKPHOS 88 10/26/2021   BILITOT 0.4 10/26/2021   Lab Results  Component Value Date   HGBA1C 5.5 10/26/2021   HGBA1C 5.4 04/22/2021   HGBA1C 5.5 03/05/2020   HGBA1C 5.9 01/14/2019   HGBA1C 5.7 07/10/2017   Lab Results  Component Value Date   INSULIN 29.4 (H) 10/26/2021  Lab Results  Component Value Date   TSH 1.670 10/26/2021   Lab Results  Component Value Date   CHOL 107 10/26/2021   HDL 41 10/26/2021   LDLCALC 48 10/26/2021   TRIG 94 10/26/2021   CHOLHDL 3.1 04/22/2021   Lab Results  Component Value Date   VD25OH 33.8 10/26/2021   VD25OH 38 04/22/2021   Lab Results  Component Value Date   WBC 8.0 07/19/2021   HGB 14.8 07/19/2021   HCT 43.5 07/19/2021   MCV 86 07/19/2021   PLT 268 07/19/2021   No results found for:  "IRON", "TIBC", "FERRITIN"  Attestation Statements:   Reviewed by clinician on day of visit: allergies, medications, problem list, medical history, surgical history, family history, social history, and previous encounter notes.   Wilhemena Durie, am acting as Location manager for [employer], DO.  I have reviewed the above documentation for accuracy and completeness, and I agree with the above. Jearld Lesch, DO

## 2022-02-08 ENCOUNTER — Ambulatory Visit: Payer: 59 | Admitting: Bariatrics

## 2022-02-15 ENCOUNTER — Encounter: Payer: Self-pay | Admitting: Bariatrics

## 2022-02-15 ENCOUNTER — Ambulatory Visit: Payer: 59 | Admitting: Bariatrics

## 2022-02-15 VITALS — BP 108/67 | HR 62 | Temp 98.3°F | Ht 77.0 in | Wt 303.0 lb

## 2022-02-15 DIAGNOSIS — Z6835 Body mass index (BMI) 35.0-35.9, adult: Secondary | ICD-10-CM

## 2022-02-15 DIAGNOSIS — E88819 Insulin resistance, unspecified: Secondary | ICD-10-CM | POA: Diagnosis not present

## 2022-02-15 DIAGNOSIS — E559 Vitamin D deficiency, unspecified: Secondary | ICD-10-CM | POA: Diagnosis not present

## 2022-02-15 DIAGNOSIS — E669 Obesity, unspecified: Secondary | ICD-10-CM

## 2022-02-15 DIAGNOSIS — E782 Mixed hyperlipidemia: Secondary | ICD-10-CM | POA: Diagnosis not present

## 2022-02-15 MED ORDER — VITAMIN D (ERGOCALCIFEROL) 1.25 MG (50000 UNIT) PO CAPS: 50000.0000 [IU] | ORAL_CAPSULE | ORAL | 0 refills | Status: DC

## 2022-02-16 ENCOUNTER — Encounter (INDEPENDENT_AMBULATORY_CARE_PROVIDER_SITE_OTHER): Payer: Self-pay | Admitting: Bariatrics

## 2022-02-16 DIAGNOSIS — E786 Lipoprotein deficiency: Secondary | ICD-10-CM | POA: Insufficient documentation

## 2022-02-16 LAB — HEMOGLOBIN A1C
Est. average glucose Bld gHb Est-mCnc: 111 mg/dL
Hgb A1c MFr Bld: 5.5 % (ref 4.8–5.6)

## 2022-02-16 LAB — COMPREHENSIVE METABOLIC PANEL
ALT: 30 IU/L (ref 0–44)
AST: 21 IU/L (ref 0–40)
Albumin/Globulin Ratio: 2.2 (ref 1.2–2.2)
Albumin: 4.3 g/dL (ref 3.8–4.9)
Alkaline Phosphatase: 84 IU/L (ref 44–121)
BUN/Creatinine Ratio: 17 (ref 10–24)
BUN: 16 mg/dL (ref 8–27)
Bilirubin Total: 0.3 mg/dL (ref 0.0–1.2)
CO2: 23 mmol/L (ref 20–29)
Calcium: 9.4 mg/dL (ref 8.6–10.2)
Chloride: 104 mmol/L (ref 96–106)
Creatinine, Ser: 0.96 mg/dL (ref 0.76–1.27)
Globulin, Total: 2 g/dL (ref 1.5–4.5)
Glucose: 85 mg/dL (ref 70–99)
Potassium: 4.3 mmol/L (ref 3.5–5.2)
Sodium: 140 mmol/L (ref 134–144)
Total Protein: 6.3 g/dL (ref 6.0–8.5)
eGFR: 90 mL/min/{1.73_m2} (ref >59)

## 2022-02-16 LAB — LIPID PANEL WITH LDL/HDL RATIO
Cholesterol, Total: 110 mg/dL (ref 100–199)
HDL: 39 mg/dL — ABNORMAL LOW (ref >39)
LDL Chol Calc (NIH): 51 mg/dL (ref 0–99)
LDL/HDL Ratio: 1.3 ratio (ref 0.0–3.6)
Triglycerides: 107 mg/dL (ref 0–149)
VLDL Cholesterol Cal: 20 mg/dL (ref 5–40)

## 2022-02-16 LAB — VITAMIN D 25 HYDROXY (VIT D DEFICIENCY, FRACTURES): Vit D, 25-Hydroxy: 56.9 ng/mL (ref 30.0–100.0)

## 2022-02-16 LAB — INSULIN, RANDOM: INSULIN: 29.1 u[IU]/mL — ABNORMAL HIGH (ref 2.6–24.9)

## 2022-02-28 ENCOUNTER — Other Ambulatory Visit: Payer: Self-pay | Admitting: Cardiovascular Disease

## 2022-03-01 ENCOUNTER — Encounter: Payer: Self-pay | Admitting: Bariatrics

## 2022-03-01 ENCOUNTER — Ambulatory Visit: Payer: 59 | Admitting: Internal Medicine

## 2022-03-01 NOTE — Progress Notes (Signed)
Chief Complaint:   OBESITY Dennis Castro is here to discuss his progress with his obesity treatment plan along with follow-up of his obesity related diagnoses. Dennis Castro is on the Category 4 Plan and states he is following his eating plan approximately 25% of the time. Dennis Castro states he is weight lifting and walking for 20-40 minutes 4-5 times per week.  Today's visit was #: 5 Starting weight: 327 lbs Starting date: 10/26/2021 Today's weight: 303 lbs Today's date: 02/15/2022 Total lbs lost to date: 24 Total lbs lost since last in-office visit: 0  Interim History: Dennis Castro is up 7 pounds since his last visit.  He states he "fell off the wagon".  Subjective:   1. Vitamin D insufficiency Dennis Castro is taking Vitamin D and he notes minimal sun exposure.   2. Insulin resistance Dennis Castro is not on medications currently.   3. Mixed hyperlipidemia Dennis Castro is taking Lipitor, Colestid, and Omega 3 fish oil.   Assessment/Plan:   1. Vitamin D insufficiency We will check labs today, and we will refill prescription Vitamin D 50,000 IU once weekly for 1 month.   - Vitamin D, Ergocalciferol, (DRISDOL) 1.25 MG (50000 UNIT) CAPS capsule; Take 1 capsule (50,000 Units total) by mouth every 7 (seven) days.  Dispense: 5 capsule; Refill: 0 - VITAMIN D 25 Hydroxy (Vit-D Deficiency, Fractures)  2. Insulin resistance We will check labs today. Dennis Castro will minimize all carbohydrates (sweets and starches).   - Comprehensive metabolic panel - Insulin, random - Hemoglobin A1c  3. Mixed hyperlipidemia We will check labs today. Dennis Castro will continue his medications as directed.   - Lipid Panel With LDL/HDL Ratio  4. Obesity with current BMI of 35.9 Dennis Castro is currently in the action stage of change. As such, his goal is to continue with weight loss efforts. He has agreed to the Category 4 Plan and keeping a food journal and adhering to recommended goals of 1800 calories and 120-150 grams of protein.   He will  adhere more closely to the plan 80-90%.  Mindful eating was discussed.  We will purge all carbohydrates.  He will drink water with lemon or lime.  Exercise goals: As is.   Behavioral modification strategies: increasing lean protein intake, decreasing simple carbohydrates, increasing vegetables, increasing water intake, decreasing eating out, no skipping meals, meal planning and cooking strategies, keeping healthy foods in the home, and planning for success.  Dennis Castro has agreed to follow-up with our clinic in 4 weeks. He was informed of the importance of frequent follow-up visits to maximize his success with intensive lifestyle modifications for his multiple health conditions.   Dennis Castro was informed we would discuss his lab results at his next visit unless there is a critical issue that needs to be addressed sooner. Dennis Castro agreed to keep his next visit at the agreed upon time to discuss these results.  Objective:   Blood pressure 108/67, pulse 62, temperature 98.3 F (36.8 C), height 6\' 5"  (1.956 m), weight (!) 303 lb (137.4 kg), SpO2 100 %. Body mass index is 35.93 kg/m.  General: Cooperative, alert, well developed, in no acute distress. HEENT: Conjunctivae and lids unremarkable. Cardiovascular: Regular rhythm.  Lungs: Normal work of breathing. Neurologic: No focal deficits.   Lab Results  Component Value Date   CREATININE 0.96 02/15/2022   BUN 16 02/15/2022   NA 140 02/15/2022   K 4.3 02/15/2022   CL 104 02/15/2022   CO2 23 02/15/2022   Lab Results  Component Value Date   ALT  30 02/15/2022   AST 21 02/15/2022   ALKPHOS 84 02/15/2022   BILITOT 0.3 02/15/2022   Lab Results  Component Value Date   HGBA1C 5.5 02/15/2022   HGBA1C 5.5 10/26/2021   HGBA1C 5.4 04/22/2021   HGBA1C 5.5 03/05/2020   HGBA1C 5.9 01/14/2019   Lab Results  Component Value Date   INSULIN 29.1 (H) 02/15/2022   INSULIN 29.4 (H) 10/26/2021   Lab Results  Component Value Date   TSH 1.670 10/26/2021    Lab Results  Component Value Date   CHOL 110 02/15/2022   HDL 39 (L) 02/15/2022   LDLCALC 51 02/15/2022   TRIG 107 02/15/2022   CHOLHDL 3.1 04/22/2021   Lab Results  Component Value Date   VD25OH 56.9 02/15/2022   VD25OH 33.8 10/26/2021   VD25OH 38 04/22/2021   Lab Results  Component Value Date   WBC 8.0 07/19/2021   HGB 14.8 07/19/2021   HCT 43.5 07/19/2021   MCV 86 07/19/2021   PLT 268 07/19/2021   No results found for: "IRON", "TIBC", "FERRITIN"  Attestation Statements:   Reviewed by clinician on day of visit: allergies, medications, problem list, medical history, surgical history, family history, social history, and previous encounter notes.   Trude Mcburney, am acting as Energy manager for [employer], DO.  I have reviewed the above documentation for accuracy and completeness, and I agree with the above. Corinna Capra, DO

## 2022-03-16 ENCOUNTER — Ambulatory Visit: Payer: 59 | Admitting: Bariatrics

## 2022-03-20 ENCOUNTER — Ambulatory Visit: Payer: 59 | Admitting: Internal Medicine

## 2022-03-20 ENCOUNTER — Encounter: Payer: Self-pay | Admitting: Internal Medicine

## 2022-03-20 DIAGNOSIS — G629 Polyneuropathy, unspecified: Secondary | ICD-10-CM | POA: Diagnosis not present

## 2022-03-20 DIAGNOSIS — E782 Mixed hyperlipidemia: Secondary | ICD-10-CM

## 2022-03-20 NOTE — Patient Instructions (Addendum)
I am happy you are feeling well.   GO TO THE FRONT DESK, PLEASE SCHEDULE YOUR APPOINTMENTS Come back for   a physical exam in approximately 4 months

## 2022-03-20 NOTE — Progress Notes (Signed)
Subjective:    Patient ID: Dennis Castro., male    DOB: [birth year], 61 y.o.   MRN: [id number]  DOS:  03/20/2022 Type of visit - description: f/u  Since the last office visit is doing well. Saw neurology, cardiology and the wellness clinic. Notes reviewed.  He denies chest pain or difficulty breathing Going to the gym, exercises well without DOE or CP. Denies lower extremity edema at this point.  No claudication.  Wt Readings from Last 3 Encounters:  03/20/22 (!) 307 lb 6 oz (139.4 kg)  02/15/22 (!) 303 lb (137.4 kg)  01/11/22 296 lb (134.3 kg)     Review of Systems See above   Past Medical History:  Diagnosis Date   Anxiety    Arthritis    Atypical chest pain    Back pain    Cholelithiasis 09/2015   determined by CT, asymptomatic   Constipation    Coronary artery disease    Diverticulitis    Family history of heart disease    GERD (gastroesophageal reflux disease)    diet related   Hyperlipidemia    IBS (irritable bowel syndrome)    Joint pain    Seasonal allergies    Swelling of lower extremity     Past Surgical History:  Procedure Laterality Date   CARDIAC CATHETERIZATION  04/03/2008   Dr. Allyson Sabal; less than 30% occlusive disease   CHOLECYSTECTOMY N/A 06/14/2018   Procedure: LAPAROSCOPIC CHOLECYSTECTOMY;  Surgeon: Axel Filler, MD;  Location: [facility] OR;  Service: General;  Laterality: N/A;   CT CTA CORONARY W/CA SCORE W/CM &/OR WO/CM  08/04/2021   CT FFR analysis showed flow limitations in the  distal tip of the LAD.Coronary calcium score of 212   INSERTION OF MESH  12/13/2018   Procedure: Insertion Of Mesh;  Surgeon: Axel Filler, MD;  Location: [facility] OR;  Service: General;;   TONSILLECTOMY     age 83   TRANSTHORACIC ECHOCARDIOGRAM  08/02/2021   EF 55-60%,Grade I DD,mild LVH,mild aortic dialation of 43mm   UMBILICAL HERNIA REPAIR N/A 12/13/2018   Procedure: LAPAROSCOPIC UMBILICAL HERNIA REPAIR;  Surgeon: Axel Filler, MD;  Location: [facility] OR;   Service: General;  Laterality: N/A;    Current Outpatient Medications  Medication Instructions   Alpha-Lipoic Acid 200 mg, Oral, Daily   aspirin EC 81 mg, Oral, Daily   atorvastatin (LIPITOR) 20 mg, Oral, Daily at bedtime   colestipol (COLESTID) 1 g, Oral, 2 times daily   Fish Oil 2,000 mg, Oral, Daily   methylcellulose (CITRUCEL) oral powder If needed   metoprolol tartrate (LOPRESSOR) 25 MG tablet Take 1/2 tablet 12.5 mg twice a day   Multiple Vitamin (MULTIVITAMIN WITH MINERALS) TABS tablet 1 tablet, Oral, Daily   sildenafil (REVATIO) 60-80 mg, Oral, At bedtime PRN   Vitamin D (Ergocalciferol) (DRISDOL) 50,000 Units, Oral, Every 7 days       Objective:   Physical Exam BP 132/86   Pulse 71   Temp 97.6 F (36.4 C) (Oral)   Resp 18   Ht 6\' 5"  (1.956 m)   Wt (!) 307 lb 6 oz (139.4 kg)   SpO2 95%   BMI 36.45 kg/m  General:   Well developed, NAD, BMI noted. HEENT:  Normocephalic . Face symmetric, atraumatic Lungs:  CTA B Normal respiratory effort, no intercostal retractions, no accessory muscle use. Heart: RRR,  no murmur.  Lower extremities: no pretibial edema bilaterally.  Normal pedal pulses bilaterally.  Good capillary refill throughout the toes. Skin:  Not pale. Not jaundice Neurologic:  alert & oriented X3.  Speech normal, gait appropriate for age and unassisted Psych--  Cognition and judgment appear intact.  Cooperative with normal attention span and concentration.  Behavior appropriate. No anxious or depressed appearing.      Assessment    Assessment   Hyperlipidemia   GERD Obesity , Morbid  CAD:  --Chest pain: Myoview stress test subtle anterior ischemia --Cardiac cath 06-24-09: Noncritical 30% LAD with subtle anteroapical HK. Diverticulitis Seasonal allergies +FH prostate ca (father age 48) +FH CAD (mother age 40) kidney  Stones Neuropathy (Dx at neurology 06-2021) MUTYH  heterozygous mutation (per GI note reviewed)    PLAN: Hyperlipidemia:  Well-controlled per last FLP, on Lipitor. Morbid obesity: Now seen at the wellness clinic, weight dropped to the 293 levels, he let his guard down and regained wt but  He is back on track and plans to continue losing weight. CAD: Saw cardiology 01/04/2022, no med changes, echo: Enlarged aorta.  Follow-up by cardiology. Neuropathy: Saw neurology 12/15/2021, was Rx  weight loss, start vitamin B12 supplements and cont w/ alpha lipoic acid. Also they were concerned about PVD, ABIs?  Today he denies claudication and has good pedal pulses.  No need for ABIs at this point. 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

## 2022-03-20 NOTE — Assessment & Plan Note (Signed)
Hyperlipidemia: Well-controlled per last FLP, on Lipitor. Morbid obesity: Now seen at the wellness clinic, weight dropped to the 293 levels, he let his guard down and regained wt but  He is back on track and plans to continue losing weight. CAD: Saw cardiology 01/04/2022, no med changes, echo: Enlarged aorta.  Follow-up by cardiology. Neuropathy: Saw neurology 12/15/2021, was Rx  weight loss, start vitamin B12 supplements and cont w/ alpha lipoic acid. Also they were concerned about PVD, ABIs?  Today he denies claudication and has good pedal pulses.  No need for ABIs at this point. 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

## 2022-03-22 ENCOUNTER — Encounter: Payer: Self-pay | Admitting: Bariatrics

## 2022-03-22 ENCOUNTER — Ambulatory Visit: Payer: 59 | Admitting: Bariatrics

## 2022-03-22 VITALS — BP 114/74 | HR 59 | Temp 97.9°F | Ht 77.0 in | Wt 301.0 lb

## 2022-03-22 DIAGNOSIS — E559 Vitamin D deficiency, unspecified: Secondary | ICD-10-CM

## 2022-03-22 DIAGNOSIS — E669 Obesity, unspecified: Secondary | ICD-10-CM

## 2022-03-22 DIAGNOSIS — E88819 Insulin resistance, unspecified: Secondary | ICD-10-CM | POA: Diagnosis not present

## 2022-03-22 DIAGNOSIS — Z6835 Body mass index (BMI) 35.0-35.9, adult: Secondary | ICD-10-CM

## 2022-03-22 DIAGNOSIS — E786 Lipoprotein deficiency: Secondary | ICD-10-CM | POA: Diagnosis not present

## 2022-03-22 MED ORDER — VITAMIN D (ERGOCALCIFEROL) 1.25 MG (50000 UNIT) PO CAPS: 50000.0000 [IU] | ORAL_CAPSULE | ORAL | 0 refills | Status: DC

## 2022-04-10 NOTE — Patient Instructions (Signed)

## 2022-04-11 ENCOUNTER — Encounter: Payer: Self-pay | Admitting: Bariatrics

## 2022-04-11 NOTE — Progress Notes (Signed)
Chief Complaint:   OBESITY Dennis Castro is here to discuss his progress with his obesity treatment plan along with follow-up of his obesity related diagnoses. Dennis Castro is on the Category 4 Plan and keeping a food journal and adhering to recommended goals of 1800 calories and 120-150 grams of protein and states he is following his eating plan approximately 70% of the time. Dennis Castro states he is lifting weights and on the treadmill for 45 minutes 4 times per week.  Today's visit was #: 6 Starting weight: 327 lbs Starting date: 10/26/2021 Today's weight: 301 lbs Today's date: 03/22/2022 Total lbs lost to date: 26 Total lbs lost since last in-office visit: 2  Interim History: Dennis Castro is down another 2 pounds since his last visit.  Subjective:   1. Low HDL (under 40) Tery's last HDL was 39.  2. Insulin resistance Dennis Castro is not on medications currently, and his last insulin level was 29.1.  3. Vitamin D insufficiency Dennis Castro's last vitamin D level was 56.9.  Assessment/Plan:   1. Low HDL (under 40) Dennis Castro will continue Lipitor, and he will keep his carbohydrates low.  2. Insulin resistance Dennis Castro will work on minimizing carbohydrates (sweets and starches).  Tips to lose belly fat, and healthy versus unhealthy fats were discussed.  3. Vitamin D insufficiency Dennis Castro will continue prescription vitamin D at 50,000 IU every 14 days, and we will refill for 90 days.  - Vitamin D, Ergocalciferol, (DRISDOL) 1.25 MG (50000 UNIT) CAPS capsule; Take 1 capsule (50,000 Units total) by mouth every 14 (fourteen) days.  Dispense: 6 capsule; Refill: 0  4. Obesity with current BMI of 35.7 Dennis Castro is currently in the action stage of change. As such, his goal is to continue with weight loss efforts. He has agreed to the Category 4 Plan and keeping a food journal and adhering to recommended goals of 1800 calories and 120-150 grams of protein.   Meal planning was discussed.  Review labs with the patient  from 02/15/2022, CMP, lipid, vitamin D, and A1c.  He will get back on track.  Exercise goals: He will get back into the gym.  Behavioral modification strategies: increasing lean protein intake, decreasing simple carbohydrates, increasing vegetables, increasing water intake, decreasing eating out, no skipping meals, meal planning and cooking strategies, keeping healthy foods in the home, and planning for success.  Dennis Castro has agreed to follow-up with our clinic in 4 weeks. He was informed of the importance of frequent follow-up visits to maximize his success with intensive lifestyle modifications for his multiple health conditions.   Objective:   Blood pressure 114/74, pulse (!) 59, temperature 97.9 F (36.6 C), height 6\' 5"  (1.956 m), weight (!) 301 lb (136.5 kg), SpO2 98 %. Body mass index is 35.69 kg/m.  General: Cooperative, alert, well developed, in no acute distress. HEENT: Conjunctivae and lids unremarkable. Cardiovascular: Regular rhythm.  Lungs: Normal work of breathing. Neurologic: No focal deficits.   Lab Results  Component Value Date   CREATININE 0.96 02/15/2022   BUN 16 02/15/2022   NA 140 02/15/2022   K 4.3 02/15/2022   CL 104 02/15/2022   CO2 23 02/15/2022   Lab Results  Component Value Date   ALT 30 02/15/2022   AST 21 02/15/2022   ALKPHOS 84 02/15/2022   BILITOT 0.3 02/15/2022   Lab Results  Component Value Date   HGBA1C 5.5 02/15/2022   HGBA1C 5.5 10/26/2021   HGBA1C 5.4 04/22/2021   HGBA1C 5.5 03/05/2020   HGBA1C 5.9 01/14/2019  Lab Results  Component Value Date   INSULIN 29.1 (H) 02/15/2022   INSULIN 29.4 (H) 10/26/2021   Lab Results  Component Value Date   TSH 1.670 10/26/2021   Lab Results  Component Value Date   CHOL 110 02/15/2022   HDL 39 (L) 02/15/2022   LDLCALC 51 02/15/2022   TRIG 107 02/15/2022   CHOLHDL 3.1 04/22/2021   Lab Results  Component Value Date   VD25OH 56.9 02/15/2022   VD25OH 33.8 10/26/2021   VD25OH 38  04/22/2021   Lab Results  Component Value Date   WBC 8.0 07/19/2021   HGB 14.8 07/19/2021   HCT 43.5 07/19/2021   MCV 86 07/19/2021   PLT 268 07/19/2021   No results found for: "IRON", "TIBC", "FERRITIN"  Attestation Statements:   Reviewed by clinician on day of visit: allergies, medications, problem list, medical history, surgical history, family history, social history, and previous encounter notes.   Trude Mcburney, am acting as Energy manager for [employer], DO.  I have reviewed the above documentation for accuracy and completeness, and I agree with the above. Corinna Capra, DO

## 2022-04-17 ENCOUNTER — Ambulatory Visit: Payer: 59 | Admitting: Bariatrics

## 2022-04-17 ENCOUNTER — Encounter: Payer: Self-pay | Admitting: Bariatrics

## 2022-04-17 VITALS — BP 119/74 | HR 63 | Temp 98.2°F | Ht 77.0 in | Wt 298.0 lb

## 2022-04-17 DIAGNOSIS — Z6835 Body mass index (BMI) 35.0-35.9, adult: Secondary | ICD-10-CM

## 2022-04-17 DIAGNOSIS — E669 Obesity, unspecified: Secondary | ICD-10-CM

## 2022-04-17 DIAGNOSIS — E559 Vitamin D deficiency, unspecified: Secondary | ICD-10-CM

## 2022-04-17 DIAGNOSIS — E782 Mixed hyperlipidemia: Secondary | ICD-10-CM | POA: Diagnosis not present

## 2022-04-17 MED ORDER — VITAMIN D (ERGOCALCIFEROL) 1.25 MG (50000 UNIT) PO CAPS: 50000.0000 [IU] | ORAL_CAPSULE | ORAL | 0 refills | Status: DC

## 2022-04-24 NOTE — Progress Notes (Signed)
Chief Complaint:   OBESITY Dennis Castro is here to discuss his progress with his obesity treatment plan along with follow-up of his obesity related diagnoses. Dennis Castro is on the Category 4 Plan and states he is following his eating plan approximately 65% of the time. Dennis Castro states he is walking on the treadmill/lifting weights for 30 to 45 minutes 3 times per week.  Today's visit was #: 7 Starting weight: 327 lbs Starting date: 10/26/21 Today's weight: 298 lbs Today's date: 04/17/22 Total lbs lost to date: 29 Total lbs lost since last in-office visit: 3  Interim History: He is down 3 pounds since his last visit.  He would like to be at 250 pounds.  Subjective:   1. Vitamin D insufficiency Taking as directed.  2. Mixed hyperlipidemia Taking Lipitor.  Assessment/Plan:   1. Vitamin D insufficiency Refill: - Vitamin D, Ergocalciferol, (DRISDOL) 1.25 MG (50000 UNIT) CAPS capsule; Take 1 capsule (50,000 Units total) by mouth every 14 (fourteen) days.  Dispense: 6 capsule; Refill: 0  2. Mixed hyperlipidemia 1.  Continue Lipitor 2.  No trans fats 3.  Minimize unhealthy fats.  3. Obesity with current BMI of 35.3 1.  Meal planning 2.  Intentional eating 3.  Will will use his app (journaling).  [name] is currently in the action stage of change. As such, his goal is to continue with weight loss efforts. He has agreed to the Category 4 Plan.   Exercise goals: Doing more resistance and cardio.  Behavioral modification strategies: increasing lean protein intake, decreasing simple carbohydrates, increasing vegetables, and increasing water intake.  Dennis Castro has agreed to follow-up with our clinic in 3-4 weeks. He was informed of the importance of frequent follow-up visits to maximize his success with intensive lifestyle modifications for his multiple health conditions.    Objective:   Blood pressure 119/74, pulse 63, temperature 98.2 F (36.8 C), height 6\' 5"  (1.956 m), weight 298 lb  (135.2 kg), SpO2 98 %. Body mass index is 35.34 kg/m.  General: Cooperative, alert, well developed, in no acute distress. HEENT: Conjunctivae and lids unremarkable. Cardiovascular: Regular rhythm.  Lungs: Normal work of breathing. Neurologic: No focal deficits.   Lab Results  Component Value Date   CREATININE 0.96 02/15/2022   BUN 16 02/15/2022   NA 140 02/15/2022   K 4.3 02/15/2022   CL 104 02/15/2022   CO2 23 02/15/2022   Lab Results  Component Value Date   ALT 30 02/15/2022   AST 21 02/15/2022   ALKPHOS 84 02/15/2022   BILITOT 0.3 02/15/2022   Lab Results  Component Value Date   HGBA1C 5.5 02/15/2022   HGBA1C 5.5 10/26/2021   HGBA1C 5.4 04/22/2021   HGBA1C 5.5 03/05/2020   HGBA1C 5.9 01/14/2019   Lab Results  Component Value Date   INSULIN 29.1 (H) 02/15/2022   INSULIN 29.4 (H) 10/26/2021   Lab Results  Component Value Date   TSH 1.670 10/26/2021   Lab Results  Component Value Date   CHOL 110 02/15/2022   HDL 39 (L) 02/15/2022   LDLCALC 51 02/15/2022   TRIG 107 02/15/2022   CHOLHDL 3.1 04/22/2021   Lab Results  Component Value Date   VD25OH 56.9 02/15/2022   VD25OH 33.8 10/26/2021   VD25OH 38 04/22/2021   Lab Results  Component Value Date   WBC 8.0 07/19/2021   HGB 14.8 07/19/2021   HCT 43.5 07/19/2021   MCV 86 07/19/2021   PLT 268 07/19/2021   No results found for: "  IRON", "TIBC", "FERRITIN"   Attestation Statements:   Reviewed by clinician on day of visit: allergies, medications, problem list, medical history, surgical history, family history, social history, and previous encounter notes.  I, Dawn Whitmire, FNP-C, am acting as transcriptionist for Dr. Jearld Lesch.  I have reviewed the above documentation for accuracy and completeness, and I agree with the above. Jearld Lesch, DO

## 2022-04-25 ENCOUNTER — Encounter: Payer: Self-pay | Admitting: Bariatrics

## 2022-05-22 ENCOUNTER — Ambulatory Visit: Payer: 59 | Admitting: Bariatrics

## 2022-05-29 ENCOUNTER — Ambulatory Visit: Payer: 59 | Admitting: Bariatrics

## 2022-05-29 ENCOUNTER — Encounter: Payer: Self-pay | Admitting: Bariatrics

## 2022-05-29 VITALS — BP 107/70 | HR 65 | Temp 98.0°F | Ht 77.0 in | Wt 297.0 lb

## 2022-05-29 DIAGNOSIS — E782 Mixed hyperlipidemia: Secondary | ICD-10-CM

## 2022-05-29 DIAGNOSIS — Z6835 Body mass index (BMI) 35.0-35.9, adult: Secondary | ICD-10-CM | POA: Insufficient documentation

## 2022-05-29 DIAGNOSIS — E669 Obesity, unspecified: Secondary | ICD-10-CM | POA: Insufficient documentation

## 2022-05-29 DIAGNOSIS — E559 Vitamin D deficiency, unspecified: Secondary | ICD-10-CM | POA: Diagnosis not present

## 2022-05-29 MED ORDER — VITAMIN D (ERGOCALCIFEROL) 1.25 MG (50000 UNIT) PO CAPS: 50000.0000 [IU] | ORAL_CAPSULE | ORAL | 0 refills | Status: DC

## 2022-05-29 NOTE — Progress Notes (Deleted)
Type II Diabetes HgbA1c {ACTION; IS/IS NOT:21021397} at goal. Last A1c was *** CBGs: Fasting ***, 2 hour PP *** Not checking Episodes of hypoglycemia: {yes***/no:17258} Medication(s):   Lab Results  Component Value Date   HGBA1C 5.5 02/15/2022   HGBA1C 5.5 10/26/2021   HGBA1C 5.4 04/22/2021   Lab Results  Component Value Date   LDLCALC 51 02/15/2022   CREATININE 0.96 02/15/2022   Lab Results  Component Value Date   GFR 81.47 07/09/2019   GFR 86.01 01/16/2018   GFR 83.77 07/19/2015    Plan: Continue medications.  2.   Will keep all carbohydrates low both sweets and starches.  3.   Will continue exercise regimen.  ***.

## 2022-06-13 ENCOUNTER — Other Ambulatory Visit: Payer: Self-pay | Admitting: Bariatrics

## 2022-06-13 DIAGNOSIS — E559 Vitamin D deficiency, unspecified: Secondary | ICD-10-CM

## 2022-06-13 NOTE — Progress Notes (Signed)
Chief Complaint:   OBESITY Dennis Castro is here to discuss his progress with his obesity treatment plan along with follow-up of his obesity related diagnoses. Dennis Castro is on the Category 4 plan and states he is following his eating plan approximately 50% of the time. Dennis Castro states he is working out for 60-120 minutes 4-5 times per week.  Today's visit was #: 8 Starting weight: 327 lbs Starting date: 10/26/21 Today's weight: 297 lbs Today's date: 05/29/22 Total lbs lost to date: 30 Total lbs lost since last in-office visit: -1  Interim History: He is down 1 pound since his last visit.  He states that he had gained weight since his last office visit but was able to get it off.  Subjective:   1. Vitamin D insufficiency Taking vitamin D.  2. Mixed hyperlipidemia Taking omega-3 and Colestid.   Assessment/Plan:   1. Vitamin D insufficiency Refill- - Vitamin D, Ergocalciferol, (DRISDOL) 1.25 MG (50000 UNIT) CAPS capsule; Take 1 capsule (50,000 Units total) by mouth every 14 (fourteen) days.  Dispense: 6 capsule; Refill: 0  2. Mixed hyperlipidemia 1.  Continue medications. 2.  Continue exercise (cardio and exercise). 3.  Increase vegetables, decrease dairy.   3. Generalized obesity BMI 35.0-35.9,adult 1.  Meal planning. 2.  Intentional eating. 3.  Will stick with the plan 80-90%.  Dennis Castro is currently in the action stage of change. As such, his goal is to continue with weight loss efforts. He has agreed to the Category 4 plan.  Exercise goals: He has not been going to the gym due to his wife's illness but he is still exercising.  Behavioral modification strategies: increasing lean protein intake, decreasing simple carbohydrates, increasing vegetables, increasing water intake, decreasing eating out, no skipping meals, meal planning and cooking strategies, keeping healthy foods in the home, and planning for success.  Dennis Castro has agreed to follow-up with our clinic in 4 weeks. He was  informed of the importance of frequent follow-up visits to maximize his success with intensive lifestyle modifications for his multiple health conditions.    Objective:   Blood pressure 107/70, pulse 65, temperature 98 F (36.7 C), height '6\' 5"'$  (1.956 m), weight 297 lb (134.7 kg), SpO2 96 %. Body mass index is 35.22 kg/m.  General: Cooperative, alert, well developed, in no acute distress. HEENT: Conjunctivae and lids unremarkable. Cardiovascular: Regular rhythm.  Lungs: Normal work of breathing. Neurologic: No focal deficits.   Lab Results  Component Value Date   CREATININE 0.96 02/15/2022   BUN 16 02/15/2022   NA 140 02/15/2022   K 4.3 02/15/2022   CL 104 02/15/2022   CO2 23 02/15/2022   Lab Results  Component Value Date   ALT 30 02/15/2022   AST 21 02/15/2022   ALKPHOS 84 02/15/2022   BILITOT 0.3 02/15/2022   Lab Results  Component Value Date   HGBA1C 5.5 02/15/2022   HGBA1C 5.5 10/26/2021   HGBA1C 5.4 04/22/2021   HGBA1C 5.5 03/05/2020   HGBA1C 5.9 01/14/2019   Lab Results  Component Value Date   INSULIN 29.1 (H) 02/15/2022   INSULIN 29.4 (H) 10/26/2021   Lab Results  Component Value Date   TSH 1.670 10/26/2021   Lab Results  Component Value Date   CHOL 110 02/15/2022   HDL 39 (L) 02/15/2022   LDLCALC 51 02/15/2022   TRIG 107 02/15/2022   CHOLHDL 3.1 04/22/2021   Lab Results  Component Value Date   VD25OH 56.9 02/15/2022   VD25OH 33.8 10/26/2021  VD25OH 38 04/22/2021   Lab Results  Component Value Date   WBC 8.0 07/19/2021   HGB 14.8 07/19/2021   HCT 43.5 07/19/2021   MCV 86 07/19/2021   PLT 268 07/19/2021   No results found for: "IRON", "TIBC", "FERRITIN"  Attestation Statements:   Reviewed by clinician on day of visit: allergies, medications, problem list, medical history, surgical history, family history, social history, and previous encounter notes.  I, Dawn Whitmire, FNP-C, am acting as transcriptionist for Dr. Jearld Lesch.  I  have reviewed the above documentation for accuracy and completeness, and I agree with the above. Jearld Lesch, DO

## 2022-06-15 ENCOUNTER — Telehealth (INDEPENDENT_AMBULATORY_CARE_PROVIDER_SITE_OTHER): Payer: Self-pay | Admitting: Bariatrics

## 2022-06-15 ENCOUNTER — Other Ambulatory Visit: Payer: Self-pay

## 2022-06-15 DIAGNOSIS — E559 Vitamin D deficiency, unspecified: Secondary | ICD-10-CM

## 2022-06-15 MED ORDER — VITAMIN D3 1.25 MG (50000 UT) PO CAPS: ORAL_CAPSULE | ORAL | 0 refills | Status: DC

## 2022-06-15 NOTE — Telephone Encounter (Signed)
Notified patient that his prescription failed to send at his last visit, Told patient his prescription will be sent today. Patient verbalized understanding.

## 2022-06-15 NOTE — Telephone Encounter (Signed)
Pt called 06/15/22 saw Dr Owens Shark 05/29/22 was told his vitamin D would be called in.went to the pharmacy they have not received the request for his vitamin D refill

## 2022-06-15 NOTE — Telephone Encounter (Signed)
Done

## 2022-06-26 ENCOUNTER — Ambulatory Visit: Payer: 59 | Admitting: Bariatrics

## 2022-06-28 NOTE — Progress Notes (Unsigned)
[facility]    Primary neurologist:  Dr Jaynee Eagles Requesting Provider: Colon Branch, MD Primary Care Provider:  Colon Branch, MD   Reason for visit: Peripheral neuropathy  Chief complaint: No chief complaint on file.     HPI:   Update 06/29/2022 JM: Patient returns for 61-month follow-up.       History provided for reference purposes only Update 12/15/2021 JM: patient returns for 61 month follow up unaccompanied.   Notes some improvement of symptoms since prior visit. Will have occasional burning. Sometimes at night, feet can be cold sensation but has been getting better. Denies overly bothersome symptoms or pain.  Does not interfere with sleep.  He has been using alpha lipoic acid, believes taking 200mg  per day  Started working with healthy weight and wellness back in July, has lost 25 lbs to date!! He believes between the weight loss, healthy eating and going to the gym 5 times per week has been greatly beneficial.  06/2021 Labs: B12 381, B6 4.3, B1 182.6, multiple myeloma WNL   Consult visit 06/13/2021 Dr. Jaynee Eagles:  Darrick Huntsman. is a 62 y.o. male here as requested by Colon Branch, MD for lower extremity paresthesias. PMHx CAD, HLD, morbid obesity, ED, anxiety, arthritis, mild hyperglycemia.  I reviewed Dr. Ethel Rana notes: He has morbid obesity, this is an independent risk factor for neuropathy, he has burning feet at both plantar areas, going on for about a year, worse at night, he was seen with acute low back pain recently but that is better.  He started walking last summer and started noticing issues with his feet not necessarily sensory changes. When he would walk they hurt, he had plantar fasciitis, at some point he noticed burning in the feet gradually worsening. It burns, tingles, from the mid to forefoot. Notices it more at night, noticing some more during the day. Walking makes it worse, laying in bed feel it more. No weakness. He has had some low back  pain recently. He generally has pain in his low back. No imbalance. No cramping in the feet. In the toes. No alcohol use. No new medications since this started. Mother had diabetes and neuropathy. Symmetrical. No other focal neurologic deficits, associated symptoms, inciting events or modifiable factors.  Reviewed notes, labs and imaging from outside physicians, which showed:  04/22/2021: Vit D 38, tSH nml, hgba1c 5.4, B12 337    Review of Systems: Patient complains of symptoms per HPI as well as the following symptoms burning. Pertinent negatives and positives per HPI. All others negative.   Social History   Socioeconomic History   Marital status: Married    Spouse name: Roselyn Reef   Number of children: 1   Years of education: Not on file   Highest education level: Not on file  Occupational History   OccupationAstronomer, office job  Tobacco Use   Smoking status: Never   Smokeless tobacco: Never  Vaping Use   Vaping Use: Never used  Substance and Sexual Activity   Alcohol use: No   Drug use: No   Sexual activity: Yes  Other Topics Concern   Not on file  Social History Narrative   Teenage son [name], still at home   Social Determinants of Health   Financial Resource Strain: Not on file  Food Insecurity: Not on file  Transportation Needs: Not on file  Physical Activity: Not on file  Stress: Not on file  Social Connections: Not on file  Intimate Partner Violence:  Not on file    Family History  Problem Relation Age of Onset   Diabetes Mother 15   CAD Mother 85       CBAG   Colon cancer Mother    High blood pressure Mother    Heart disease Mother    Cancer Father        prostate cancer and panceatic cancer   Prostate cancer Father    Breast cancer Maternal Grandmother    Pancreatic cancer Paternal Grandfather    Stroke Neg Hx    Esophageal cancer Neg Hx    Rectal cancer Neg Hx    Stomach cancer Neg Hx    Neuropathy Neg Hx     Past Medical History:  Diagnosis  Date   Anxiety    Arthritis    Atypical chest pain    Back pain    Cholelithiasis 09/2015   determined by CT, asymptomatic   Constipation    Coronary artery disease    Diverticulitis    Family history of heart disease    GERD (gastroesophageal reflux disease)    diet related   Hyperlipidemia    IBS (irritable bowel syndrome)    Joint pain    Seasonal allergies    Swelling of lower extremity     Patient Active Problem List   Diagnosis Date Noted   Generalized obesity 05/29/2022   BMI 35.0-35.9,adult 05/29/2022   Low HDL (under 40) 02/16/2022   Thoracic aortic aneurysm ([place]) 01/04/2022   Vitamin D insufficiency 11/09/2021   Other constipation 11/09/2021   Insulin resistance 10/27/2021   Other fatigue 10/26/2021   SOB (shortness of breath) on exertion 10/26/2021   Hyperglycemia, unspecified 10/26/2021   Vitamin D deficiency 10/26/2021   Class 2 severe obesity with serious comorbidity and body mass index (BMI) of 38.0 to 38.9 in adult [facility]) 10/26/2021   Neuropathy 08/26/2021   Post-cholecystectomy syndrome 04/24/2021   Anxiety 07/10/2019   Family history of pancreatic cancer 01/15/2018   Coronary artery disease 08/10/2017   Left ankle pain 07/20/2017   Erectile dysfunction 03/21/2017   Morbid obesity ([place]) 07/20/2015   Annual physical exam 01/17/2015   PCP NOTES >>> 01/17/2015   Family history of prostate cancer 03/25/2012   GERD 04/01/2010   Hyperlipidemia 10/07/2007    Past Surgical History:  Procedure Laterality Date   CARDIAC CATHETERIZATION  04/03/2008   Dr. Gwenlyn Found; less than 30% occlusive disease   CHOLECYSTECTOMY N/A 06/14/2018   Procedure: LAPAROSCOPIC CHOLECYSTECTOMY;  Surgeon: Ralene Ok, MD;  Location: [place];  Service: General;  Laterality: N/A;   CT CTA CORONARY W/CA SCORE W/CM &/OR WO/CM  08/04/2021   CT FFR analysis showed flow limitations in the  distal tip of the LAD.Coronary calcium score of 212   INSERTION OF MESH  12/13/2018   Procedure:  Insertion Of Mesh;  Surgeon: Ralene Ok, MD;  Location: [place];  Service: General;;   TONSILLECTOMY     age 13   TRANSTHORACIC ECHOCARDIOGRAM  08/02/2021   EF 55-60%,Grade I DD,mild LVH,mild aortic dialation of [id number]   UMBILICAL HERNIA REPAIR N/A 12/13/2018   Procedure: LAPAROSCOPIC UMBILICAL HERNIA REPAIR;  Surgeon: Ralene Ok, MD;  Location: [place];  Service: General;  Laterality: N/A;    Current Outpatient Medications  Medication Sig Dispense Refill   Alpha-Lipoic Acid 100 MG CAPS Take 200 mg by mouth daily.     aspirin EC 81 MG tablet Take 81 mg by mouth daily.     atorvastatin (LIPITOR) 20 MG  tablet TAKE ONE TABLET BY MOUTH EVERY NIGHT AT BEDTIME 30 tablet 6   Cholecalciferol (VITAMIN D3) 1.25 MG (50000 UT) capsule Take one po every 14 days 6 capsule 0   colestipol (COLESTID) 1 g tablet Take 1 tablet (1 g total) by mouth 2 (two) times daily. (Patient taking differently: Take 1 g by mouth 2 (two) times daily as needed.) 180 tablet 3   methylcellulose (CITRUCEL) oral powder If needed     metoprolol tartrate (LOPRESSOR) 25 MG tablet Take 1/2 tablet 12.5 mg twice a day 90 tablet 3   Multiple Vitamin (MULTIVITAMIN WITH MINERALS) TABS tablet Take 1 tablet by mouth daily.     Omega-3 Fatty Acids (FISH OIL) 1000 MG CAPS Take 2 capsules (2,000 mg total) by mouth daily. 60 capsule 12   sildenafil (REVATIO) 20 MG tablet Take 3-4 tablets (60-80 mg total) by mouth at bedtime as needed. 30 tablet 3   No current facility-administered medications for this visit.    Allergies as of 06/29/2022   (No Known Allergies)    Vitals: There were no vitals taken for this visit. Last Weight:  Wt Readings from Last 1 Encounters:  05/29/22 297 lb (134.7 kg)   Last Height:   Ht Readings from Last 1 Encounters:  05/29/22 6\' 5"  (1.956 m)     Physical exam: Exam: Gen: NAD, conversant, well nourised, morbidly obese, well groomed                     CV: RRR, no MRG. No Carotid Bruits. slight  peripheral edema, distally feet feel cold, nontender, distal slight rubor in the lower legs Eyes: Conjunctivae clear without exudates or hemorrhage  Neuro: Detailed Neurologic Exam  Speech:    Speech is normal; fluent and spontaneous with normal comprehension.  Cognition:    The patient is oriented to person, place, and time;     recent and remote memory intact;     language fluent;     normal attention, concentration,     fund of knowledge Cranial Nerves:    The pupils are equal, round, and reactive to light.  Visual fields are full to finger confrontation. Extraocular movements are intact. Trigeminal sensation is intact and the muscles of mastication are normal. The face is symmetric. The palate elevates in the midline. Hearing intact. Voice is normal. Shoulder shrug is normal. The tongue has normal motion without fasciculations.   Coordination:    Normal   Gait:    Heel-toe and tandem gait are normal.   Motor Observation:    No asymmetry, no atrophy, and no involuntary movements noted. Tone:    Normal muscle tone.    Posture:    Posture is normal. normal erect    Strength:    Strength is V/V in the upper and lower limbs.      Sensation: decreased pin prick great toe, decreased temp in a distal to prox gradient, diminished vibration L>R at the great toes.  Position sense intact.     Reflex Exam:  DTR's:    Deep tendon reflexes in the upper and lower extremities are normal bilaterally.   Toes:    The toes are downgoing bilaterally.   Clonus:    Clonus is absent.       Assessment/Plan:  62 y.o. male here as a referral from Dr. Larose Kells for peripheral neuropathy. He reports slowly progressive burning, tingling  bottom of feet and great toe started in the past year ago likely due  to peripheral neuropathy as his symptoms are distal predominant, symmetrical, slowly progressive, and an ascending pattern with decreased sensation in small and large fibers in a gradient fashion.   Symptoms have been gradually improving since prior visit   -Continue with weight loss, being followed by healthy weight and wellness since July, has lost 25 pounds today -Discussed importance of continuing healthy diet as some foods can make symptoms worse and continued weight loss as this can help improve symptoms -Continue alpha lipoic acid -recommend increasing to 600mg  daily -Concern of possible prediabetes, prior A1c 5.5, has been making dietary changes - slight distal peripheral edema, distally feet feel cold to the touch, has CAD, distal slight rubor in the lower legs: May suggest Dr. Larose Kells check vascular exam or ABI as clinically warranted. -Vitamin B12 deficiency - recommend initiating B12 1000 mcg daily -If progresses will order EMG/NCS and can consider genetic testing (mother had neuropathy) but as symptoms stable and actually improving, will hold off on further testing    Follow-up in 6 months or call earlier if needed     CC:  Colon Branch, MD   I spent 31 minutes of face-to-face and non-face-to-face time with patient.  This included previsit chart review, lab review, study review, order entry, electronic health record documentation, patient education and discussion regarding peripheral neuropathy and treatment plan and answered all other questions to patient satisfaction  Frann Rider, [facility]  [facility] [address] [place], [place] [address]  Phone [phone] Fax [phone] Note: This document was prepared with digital dictation and possible smart phrase technology. Any transcriptional errors that result from this process are unintentional.

## 2022-06-29 ENCOUNTER — Ambulatory Visit: Payer: 59 | Admitting: Adult Health

## 2022-06-29 ENCOUNTER — Encounter: Payer: Self-pay | Admitting: Adult Health

## 2022-06-29 VITALS — BP 115/76 | HR 68 | Ht 77.0 in | Wt 300.0 lb

## 2022-06-29 DIAGNOSIS — G609 Hereditary and idiopathic neuropathy, unspecified: Secondary | ICD-10-CM

## 2022-06-29 NOTE — Patient Instructions (Addendum)
Your Plan:  Continue alpha lipoic acid 600mg  daily for nerve pain     Follow up as needed at this time      Thank you for coming to see [place] at [facility]. I hope we have been able to provide you high quality care today.  You may receive a patient satisfaction survey over the next few weeks. We would appreciate your feedback and comments so that we may continue to improve ourselves and the health of our patients.

## 2022-07-04 ENCOUNTER — Ambulatory Visit: Payer: 59 | Admitting: Bariatrics

## 2022-07-04 ENCOUNTER — Encounter: Payer: Self-pay | Admitting: Bariatrics

## 2022-07-04 VITALS — BP 111/72 | HR 68 | Temp 97.9°F | Ht 77.0 in | Wt 298.0 lb

## 2022-07-04 DIAGNOSIS — Z6835 Body mass index (BMI) 35.0-35.9, adult: Secondary | ICD-10-CM | POA: Diagnosis not present

## 2022-07-04 DIAGNOSIS — E669 Obesity, unspecified: Secondary | ICD-10-CM | POA: Diagnosis not present

## 2022-07-04 DIAGNOSIS — E786 Lipoprotein deficiency: Secondary | ICD-10-CM | POA: Diagnosis not present

## 2022-07-04 DIAGNOSIS — E559 Vitamin D deficiency, unspecified: Secondary | ICD-10-CM

## 2022-07-04 MED ORDER — VITAMIN D3 1.25 MG (50000 UT) PO CAPS: ORAL_CAPSULE | ORAL | 0 refills | Status: DC

## 2022-07-04 NOTE — Progress Notes (Signed)
   WEIGHT SUMMARY AND BIOMETRICS  Weight Gained Since Last Visit: 1lb   Vitals Temp: 97.9 F (36.6 C) BP: 111/72 Pulse Rate: 68 SpO2: 96 %   Anthropometric Measurements Height: 6\' 5"  (1.956 m) Weight: 298 lb (135.2 kg) BMI (Calculated): 35.33 Weight at Last Visit: 297lb Weight Lost Since Last Visit: 0 Weight Gained Since Last Visit: 1lb Starting Weight: 327lb Total Weight Loss (lbs): 29 lb (13.2 kg)   Body Composition  Body Fat %: 33.7 % Fat Mass (lbs): 100.6 lbs Muscle Mass (lbs): 188.6 lbs Total Body Water (lbs): 138.2 lbs Visceral Fat Rating : 20   Other Clinical Data Fasting: no Labs: no Today's Visit #: 9 Starting Date: 10/26/21    OBESITY Dennis Castro is here to discuss his progress with his obesity treatment plan along with follow-up of his obesity related diagnoses.     Nutrition Plan: the Category 4 plan - 70% adherence.  Current exercise: weightlifting and treadmill  Interim History:  He is up 1 lb but the bioimpedence scale shows that he is up 4 lbs of water weight.  Journaling consistently., Meeting protein goals., and Water intake is inadequate.  Hunger is moderately controlled.  Cravings are moderately controlled.  Assessment/Plan:   1. Vitamin D insufficiency He is taking vitamin D as directed.   Plan: Rx: Vitamin D 50,000 IU every 14 days # 6 with no refills.    2. Low HDL  He is eating fewer carbohydrates than previouly  Plan: Will continue to work on the plan and exercise.  Will do labs at the next visit.      Generalized Obesity: Current BMI BMI (Calculated): 35.33    Dennis Castro is currently in the action stage of change. As such, his goal is to continue with weight loss efforts.  He has agreed to the Category 4 plan.  Exercise goals: For substantial health benefits, adults should do at least 150 minutes (2 hours and 30 minutes) a week of moderate-intensity, or 75 minutes (1 hour and 15 minutes) a week of  vigorous-intensity aerobic physical activity, or an equivalent combination of moderate- and vigorous-intensity aerobic activity. Aerobic activity should be performed in episodes of at least 10 minutes, and preferably, it should be spread throughout the week.  Behavioral modification strategies: decreasing simple carbohydrates , better snacking choices, and planning for success.  Dennis Castro has agreed to follow-up with our clinic in 4 weeks for a repeat IC and fasting labs.        Objective:   VITALS: Per patient if applicable, see vitals. GENERAL: Alert and in no acute distress. CARDIOPULMONARY: No increased WOB. Speaking in clear sentences.  PSYCH: Pleasant and cooperative. Speech normal rate and rhythm. Affect is appropriate. Insight and judgement are appropriate. Attention is focused, linear, and appropriate.  NEURO: Oriented as arrived to appointment on time with no prompting.   Attestation Statements:    This was prepared with the assistance of Presenter, broadcasting.  Occasional wrong-word or sound-a-like substitutions may have occurred due to the inherent limitations of voice recognition software.    Jearld Lesch, DO

## 2022-07-17 ENCOUNTER — Encounter: Payer: Self-pay | Admitting: *Deleted

## 2022-08-03 ENCOUNTER — Ambulatory Visit: Payer: 59 | Admitting: Bariatrics

## 2022-08-03 ENCOUNTER — Encounter: Payer: Self-pay | Admitting: Bariatrics

## 2022-08-03 VITALS — BP 109/73 | HR 66 | Temp 97.9°F | Ht 77.0 in | Wt 302.0 lb

## 2022-08-03 DIAGNOSIS — R5383 Other fatigue: Secondary | ICD-10-CM | POA: Diagnosis not present

## 2022-08-03 DIAGNOSIS — E559 Vitamin D deficiency, unspecified: Secondary | ICD-10-CM | POA: Diagnosis not present

## 2022-08-03 DIAGNOSIS — R0602 Shortness of breath: Secondary | ICD-10-CM

## 2022-08-03 DIAGNOSIS — Z6835 Body mass index (BMI) 35.0-35.9, adult: Secondary | ICD-10-CM

## 2022-08-03 MED ORDER — VITAMIN D3 1.25 MG (50000 UT) PO CAPS: ORAL_CAPSULE | ORAL | 0 refills | Status: DC

## 2022-08-08 NOTE — Progress Notes (Signed)
Chief Complaint:   OBESITY Dennis Castro is here to discuss his progress with his obesity treatment plan along with follow-up of his obesity related diagnoses. Dennis Castro is on the Category 4 Plan and states he is following his eating plan approximately 60% of the time. Dennis Castro states he is walking and at the gym for 60 minutes 1-2 times per week.  Today's visit was #: 10 Starting weight: 327 lbs Starting date: 10/26/2021 Today's weight: 302 lbs Today's date: 08/03/2022 Total lbs lost to date: 25 Total lbs lost since last in-office visit: 0  Interim History: Dennis Castro is up 4 lbs since his last visit. He is consistent with his meals.   Subjective:   1. Fatigue, unspecified type Dennis Castro's actual RMR is 2650 cal, and expected RMR 2814 cal. RMR 7/23 2506 kcal. His actual BMR < expected RMR, but better than last year (increased 144 from 2023).   2. SOB (shortness of breath) on exertion Dennis Castro's actual RMR is 2650 cal, and expected RMR 2814 cal. RMR 7/23 2506 kcal. His actual BMR < expected RMR, but better than last year (increased 144 from 2023).   3. Vitamin D insufficiency Dennis Castro is taking Vitamin D as directed.   Assessment/Plan:   1. Fatigue, unspecified type Dennis Castro will adhere closely to the meal plan.   2. SOB (shortness of breath) on exertion Dennis Castro will adhere closely to the meal plan.   3. Vitamin D insufficiency Dennis Castro will continue Vitamin D3 50,000 every 14 days, and we will refill for 90 days.   - Cholecalciferol (VITAMIN D3) 1.25 MG (50000 UT) CAPS; Take one po every 14 days  Dispense: 6 capsule; Refill: 0  4. Morbid obesity (HCC)  5. BMI 35.0-35.9,adult Dennis Castro is currently in the action stage of change. As such, his goal is to continue with weight loss efforts. He has agreed to the Category 4 Plan.   Meal planning and intentional eating were discussed. He will adhere to the plan 85-95%.   Exercise goals: As is.   Behavioral modification strategies: increasing lean  protein intake, decreasing simple carbohydrates, increasing vegetables, increasing water intake, decreasing eating out, no skipping meals, meal planning and cooking strategies, keeping healthy foods in the home, and planning for success.  Dennis Castro has agreed to follow-up with our clinic in 4 weeks. He was informed of the importance of frequent follow-up visits to maximize his success with intensive lifestyle modifications for his multiple health conditions.   Objective:   Blood pressure 109/73, pulse 66, temperature 97.9 F (36.6 C), height 6\' 5"  (1.956 m), weight (!) 302 lb (137 kg), SpO2 99 %. Body mass index is 35.81 kg/m.  General: Cooperative, alert, well developed, in no acute distress. HEENT: Conjunctivae and lids unremarkable. Cardiovascular: Regular rhythm.  Lungs: Normal work of breathing. Neurologic: No focal deficits.   Lab Results  Component Value Date   CREATININE 0.96 02/15/2022   BUN 16 02/15/2022   NA 140 02/15/2022   K 4.3 02/15/2022   CL 104 02/15/2022   CO2 23 02/15/2022   Lab Results  Component Value Date   ALT 30 02/15/2022   AST 21 02/15/2022   ALKPHOS 84 02/15/2022   BILITOT 0.3 02/15/2022   Lab Results  Component Value Date   HGBA1C 5.5 02/15/2022   HGBA1C 5.5 10/26/2021   HGBA1C 5.4 04/22/2021   HGBA1C 5.5 03/05/2020   HGBA1C 5.9 01/14/2019   Lab Results  Component Value Date   INSULIN 29.1 (H) 02/15/2022   INSULIN 29.4 (H)  10/26/2021   Lab Results  Component Value Date   TSH 1.670 10/26/2021   Lab Results  Component Value Date   CHOL 110 02/15/2022   HDL 39 (L) 02/15/2022   LDLCALC 51 02/15/2022   TRIG 107 02/15/2022   CHOLHDL 3.1 04/22/2021   Lab Results  Component Value Date   VD25OH 56.9 02/15/2022   VD25OH 33.8 10/26/2021   VD25OH 38 04/22/2021   Lab Results  Component Value Date   WBC 8.0 07/19/2021   HGB 14.8 07/19/2021   HCT 43.5 07/19/2021   MCV 86 07/19/2021   PLT 268 07/19/2021   No results found for: "IRON",  "TIBC", "FERRITIN"  Attestation Statements:   Reviewed by clinician on day of visit: allergies, medications, problem list, medical history, surgical history, family history, social history, and previous encounter notes.   Trude Mcburney, am acting as Energy manager for [employer], DO.  I have reviewed the above documentation for accuracy and completeness, and I agree with the above. Corinna Capra, DO

## 2022-08-09 ENCOUNTER — Encounter: Payer: 59 | Admitting: Internal Medicine

## 2022-08-31 ENCOUNTER — Other Ambulatory Visit: Payer: Self-pay | Admitting: Gastroenterology

## 2022-09-04 ENCOUNTER — Encounter: Payer: 59 | Admitting: Internal Medicine

## 2022-09-04 ENCOUNTER — Ambulatory Visit: Payer: 59 | Admitting: Bariatrics

## 2022-09-18 ENCOUNTER — Ambulatory Visit: Payer: 59 | Admitting: Bariatrics

## 2022-09-25 ENCOUNTER — Ambulatory Visit (INDEPENDENT_AMBULATORY_CARE_PROVIDER_SITE_OTHER): Payer: 59 | Admitting: Internal Medicine

## 2022-09-25 ENCOUNTER — Encounter: Payer: Self-pay | Admitting: Internal Medicine

## 2022-09-25 VITALS — BP 122/66 | HR 65 | Temp 97.4°F | Resp 18 | Ht 77.0 in | Wt 308.0 lb

## 2022-09-25 DIAGNOSIS — Z09 Encounter for follow-up examination after completed treatment for conditions other than malignant neoplasm: Secondary | ICD-10-CM

## 2022-09-25 DIAGNOSIS — Z0000 Encounter for general adult medical examination without abnormal findings: Secondary | ICD-10-CM | POA: Diagnosis not present

## 2022-09-25 DIAGNOSIS — R739 Hyperglycemia, unspecified: Secondary | ICD-10-CM | POA: Diagnosis not present

## 2022-09-25 DIAGNOSIS — E782 Mixed hyperlipidemia: Secondary | ICD-10-CM

## 2022-09-25 NOTE — Patient Instructions (Addendum)
Vaccines I recommend: Covid booster RSV vaccine Flu shot this fall     GO TO THE LAB : Get the blood work     GO TO THE FRONT DESK, PLEASE SCHEDULE YOUR APPOINTMENTS Come back for checkup in 8 months    "Health Care Power of attorney" ,  "Living will" (Advance care planning documents)  If you already have a living will or healthcare power of attorney, is recommended you bring the copy to be scanned in your chart.   The document will be available to all the doctors you see in the system.  Advance care planning is a process that supports adults in  understanding and sharing their preferences regarding future medical care.  The patient's preferences are recorded in documents called Advance Directives and the can be modified at any time while the patient is in full mental capacity.   If you don't have one, please consider create one.      More information at: [email or web address]

## 2022-09-25 NOTE — Progress Notes (Unsigned)
Subjective:    Patient ID: Dennis Castro., male    DOB: [birth year], 61 y.o.   MRN: [id number]  DOS:  09/25/2022 Type of visit - description: Here for CPX  Here for CPX. In general feels good. Neuropathy symptoms are slightly better. Still struggling with weight loss.  Wt Readings from Last 3 Encounters:  09/25/22 (!) 308 lb (139.7 kg)  08/03/22 (!) 302 lb (137 kg)  07/04/22 298 lb (135.2 kg)    Review of Systems  Other than above, a 14 point review of systems is negative     Past Medical History:  Diagnosis Date   Anxiety    Arthritis    Atypical chest pain    Back pain    Cholelithiasis 09/2015   determined by CT, asymptomatic   Constipation    Coronary artery disease    Diverticulitis    Family history of heart disease    GERD (gastroesophageal reflux disease)    diet related   Hyperlipidemia    IBS (irritable bowel syndrome)    Joint pain    Seasonal allergies    Swelling of lower extremity     Past Surgical History:  Procedure Laterality Date   CARDIAC CATHETERIZATION  04/03/2008   Dr. Allyson Sabal; less than 30% occlusive disease   CHOLECYSTECTOMY N/A 06/14/2018   Procedure: LAPAROSCOPIC CHOLECYSTECTOMY;  Surgeon: Axel Filler, MD;  Location: [facility] OR;  Service: General;  Laterality: N/A;   CT CTA CORONARY W/CA SCORE W/CM &/OR WO/CM  08/04/2021   CT FFR analysis showed flow limitations in the  distal tip of the LAD.Coronary calcium score of 212   INSERTION OF MESH  12/13/2018   Procedure: Insertion Of Mesh;  Surgeon: Axel Filler, MD;  Location: [facility] OR;  Service: General;;   TONSILLECTOMY     age 64   TRANSTHORACIC ECHOCARDIOGRAM  08/02/2021   EF 55-60%,Grade I DD,mild LVH,mild aortic dialation of 43mm   UMBILICAL HERNIA REPAIR N/A 12/13/2018   Procedure: LAPAROSCOPIC UMBILICAL HERNIA REPAIR;  Surgeon: Axel Filler, MD;  Location: [facility] OR;  Service: General;  Laterality: N/A;   Social History   Socioeconomic History   Marital status: Married     Spouse name: Jamie   Number of children: 1   Years of education: Not on file   Highest education level: Not on file  Occupational History   OccupationHydrologist, office job  Tobacco Use   Smoking status: Never   Smokeless tobacco: Never  Vaping Use   Vaping Use: Never used  Substance and Sexual Activity   Alcohol use: No   Drug use: No   Sexual activity: Yes  Other Topics Concern   Not on file  Social History Narrative   Teenage son [name], still at home   Social Determinants of Health   Financial Resource Strain: Not on file  Food Insecurity: Not on file  Transportation Needs: Not on file  Physical Activity: Not on file  Stress: Not on file  Social Connections: Not on file  Intimate Partner Violence: Not on file    Current Outpatient Medications  Medication Instructions   ALPHA LIPOIC ACID PO 600 mg, Oral, Daily   aspirin EC 81 mg, Oral, Daily   atorvastatin (LIPITOR) 20 mg, Oral, Daily at bedtime   Cholecalciferol (VITAMIN D3) 1.25 MG (50000 UT) CAPS Take one po every 14 days   colestipol (COLESTID) 1 g, Oral, 2 times daily PRN, Please schedule a yearly follow up for further refills. Thank you  Fish Oil 2,000 mg, Oral, Daily   methylcellulose (CITRUCEL) oral powder If needed   metoprolol tartrate (LOPRESSOR) 25 MG tablet Take 1/2 tablet 12.5 mg twice a day   Multiple Vitamin (MULTIVITAMIN WITH MINERALS) TABS tablet 1 tablet, Oral, Daily   sildenafil (REVATIO) 60-80 mg, Oral, At bedtime PRN       Objective:   Physical Exam BP 122/66   Pulse 65   Temp (!) 97.4 F (36.3 C) (Oral)   Resp 18   Ht 6\' 5"  (1.956 m)   Wt (!) 308 lb (139.7 kg)   SpO2 97%   BMI 36.52 kg/m  General: Well developed, NAD, BMI noted Neck: No  thyromegaly  HEENT:  Normocephalic . Face symmetric, atraumatic Lungs:  CTA B Normal respiratory effort, no intercostal retractions, no accessory muscle use. Heart: RRR,  no murmur.  Abdomen:  Not distended, soft, non-tender. No rebound or  rigidity.   Lower extremities: no pretibial edema bilaterally  Skin: Exposed areas without rash. Not pale. Not jaundice Neurologic:  alert & oriented X3.  Speech normal, gait appropriate for age and unassisted Strength symmetric and appropriate for age.  Psych: Cognition and judgment appear intact.  Cooperative with normal attention span and concentration.  Behavior appropriate. No anxious or depressed appearing.     Assessment     Assessment   Hyperlipidemia   GERD Obesity , Morbid  CAD:  --Chest pain: Myoview stress test subtle anterior ischemia --Cardiac cath 06-24-09: Noncritical 30% LAD with subtle anteroapical HK. -- CTA 08/2021: Calcium score 212, see full report. --Thoracic aortic aneurysm Diverticulitis Seasonal allergies +FH prostate ca (father age 40) +FH CAD (mother age 4) kidney  Stones Neuropathy (Dx at neurology 06-2021) MUTYH  heterozygous mutation (per GI note reviewed)   PLAN: Here for CPX - Td  2016 - shingrix x2 -Vaccines I recommend: COVID booster , RSV and flu shot (fall) - CCS: Colonoscopy-2014: Diverticuli, no polyps.  Per genetic counseling evaluation, was rec to repeat a colonoscopy, done 02/21/2019.  Next 5 years per Cscope report. - Prostate cancer  screening: + FH, no symptoms, PSAs have been normal.  Check PSA. - Diet, exercise: Discussed, follow-up at their wellness clinic -Labs: CMP FLP CBC A1c PSA - ACP info provided. Hyperlipidemia: Continue atorvastatin, checking labs. Morbid obesity: Follow-up by the wellness center, encourage continue working with them CAD: Asymptomatic  Thoracic aortic aneurysm: Next echo 01-2023 Idiopathic neuropathy: LOV neurology 06/29/2022, was Rx continue weight loss, alpha lipoic acid, considering EMG/NCS and genetic testing due to FH neuropathy.  They noticed symptoms were actually improving RTC 8 months

## 2022-09-26 ENCOUNTER — Encounter: Payer: Self-pay | Admitting: Internal Medicine

## 2022-09-26 LAB — CBC WITH DIFFERENTIAL/PLATELET
Basophils Absolute: 0.1 10*3/uL (ref 0.0–0.1)
Basophils Relative: 1.1 % (ref 0.0–3.0)
Eosinophils Absolute: 0.3 10*3/uL (ref 0.0–0.7)
Eosinophils Relative: 2.9 % (ref 0.0–5.0)
HCT: 43.1 % (ref 39.0–52.0)
Hemoglobin: 14.3 g/dL (ref 13.0–17.0)
Lymphocytes Relative: 21.6 % (ref 12.0–46.0)
Lymphs Abs: 1.9 10*3/uL (ref 0.7–4.0)
MCHC: 33.1 g/dL (ref 30.0–36.0)
MCV: 87.3 fl (ref 78.0–100.0)
Monocytes Absolute: 0.8 10*3/uL (ref 0.1–1.0)
Monocytes Relative: 9.2 % (ref 3.0–12.0)
Neutro Abs: 5.8 10*3/uL (ref 1.4–7.7)
Neutrophils Relative %: 65.2 % (ref 43.0–77.0)
Platelets: 278 10*3/uL (ref 150.0–400.0)
RBC: 4.94 Mil/uL (ref 4.22–5.81)
RDW: 12.8 % (ref 11.5–15.5)
WBC: 8.9 10*3/uL (ref 4.0–10.5)

## 2022-09-26 LAB — COMPREHENSIVE METABOLIC PANEL
ALT: 24 U/L (ref 0–53)
AST: 16 U/L (ref 0–37)
Albumin: 4.2 g/dL (ref 3.5–5.2)
Alkaline Phosphatase: 73 U/L (ref 39–117)
BUN: 18 mg/dL (ref 6–23)
CO2: 28 mEq/L (ref 19–32)
Calcium: 9.5 mg/dL (ref 8.4–10.5)
Chloride: 105 mEq/L (ref 96–112)
Creatinine, Ser: 1.12 mg/dL (ref 0.40–1.50)
GFR: 71.13 mL/min (ref >60.00)
Glucose, Bld: 74 mg/dL (ref 70–99)
Potassium: 4.2 mEq/L (ref 3.5–5.1)
Sodium: 141 mEq/L (ref 135–145)
Total Bilirubin: 0.5 mg/dL (ref 0.2–1.2)
Total Protein: 6.5 g/dL (ref 6.0–8.3)

## 2022-09-26 LAB — PSA: PSA: 1.66 ng/mL (ref 0.10–4.00)

## 2022-09-26 LAB — LIPID PANEL
Cholesterol: 95 mg/dL (ref 0–200)
HDL: 33.2 mg/dL — ABNORMAL LOW (ref >39.00)
LDL Cholesterol: 39 mg/dL (ref 0–99)
NonHDL: 62.12
Total CHOL/HDL Ratio: 3
Triglycerides: 118 mg/dL (ref 0.0–149.0)
VLDL: 23.6 mg/dL (ref 0.0–40.0)

## 2022-09-26 LAB — HEMOGLOBIN A1C: Hgb A1c MFr Bld: 5.4 % (ref 4.6–6.5)

## 2022-09-26 NOTE — Assessment & Plan Note (Signed)
-   Td  2016 - shingrix x2 -Vaccines I recommend: COVID booster , RSV and flu shot (fall) - CCS: Colonoscopy-2014: Diverticuli, no polyps.  Per genetic counseling evaluation, was rec to repeat a colonoscopy, done 02/21/2019.  Next 5 years per Cscope report. - Prostate cancer  screening: + FH, no symptoms, PSAs have been normal.  Check PSA. - Diet, exercise: Discussed, follow-up at their wellness clinic -Labs: CMP FLP CBC A1c PSA - ACP info provided.

## 2022-09-26 NOTE — Assessment & Plan Note (Signed)
Here for CPX Hyperlipidemia: Continue atorvastatin, checking labs. Morbid obesity: Follow-up by the wellness center, encourage continue working with them CAD: Asymptomatic  Thoracic aortic aneurysm: Next echo 01-2023 Idiopathic neuropathy: LOV neurology 06/29/2022, was Rx continue weight loss, alpha lipoic acid, considering EMG/NCS and genetic testing due to FH neuropathy.  They noticed symptoms were actually improving RTC 8 months

## 2022-09-28 ENCOUNTER — Encounter: Payer: Self-pay | Admitting: Bariatrics

## 2022-09-28 ENCOUNTER — Ambulatory Visit: Payer: 59 | Admitting: Bariatrics

## 2022-09-28 VITALS — BP 113/77 | HR 65 | Temp 97.8°F | Ht 77.0 in | Wt 302.0 lb

## 2022-09-28 DIAGNOSIS — E559 Vitamin D deficiency, unspecified: Secondary | ICD-10-CM | POA: Diagnosis not present

## 2022-09-28 DIAGNOSIS — E782 Mixed hyperlipidemia: Secondary | ICD-10-CM

## 2022-09-28 DIAGNOSIS — Z6835 Body mass index (BMI) 35.0-35.9, adult: Secondary | ICD-10-CM | POA: Diagnosis not present

## 2022-09-28 DIAGNOSIS — E669 Obesity, unspecified: Secondary | ICD-10-CM

## 2022-09-28 MED ORDER — VITAMIN D3 1.25 MG (50000 UT) PO CAPS: ORAL_CAPSULE | ORAL | 0 refills | Status: DC

## 2022-09-28 NOTE — Progress Notes (Signed)
WEIGHT SUMMARY AND BIOMETRICS  Weight Lost Since Last Visit: 0  Weight Gained Since Last Visit: 0   Vitals Temp: 97.8 F (36.6 C) BP: 113/77 Pulse Rate: 65 SpO2: 96 %   Anthropometric Measurements Height: 6\' 5"  (1.956 m) Weight: (!) 302 lb (137 kg) BMI (Calculated): 35.8 Weight at Last Visit: 302lb Weight Lost Since Last Visit: 0 Weight Gained Since Last Visit: 0 Starting Weight: 327lb Total Weight Loss (lbs): 25 lb (11.3 kg)   Body Composition  Body Fat %: 34.3 % Fat Mass (lbs): 103.8 lbs Muscle Mass (lbs): 189.4 lbs Total Body Water (lbs): 136.6 lbs Visceral Fat Rating : 20   Other Clinical Data Fasting: no Labs: no Today's Visit #: 11 Starting Date: 10/26/21    OBESITY Julian is here to discuss his progress with his obesity treatment plan along with follow-up of his obesity related diagnoses.     Nutrition Plan: the Category 4 plan - 40% adherence.  Current exercise: Will get back to the gym at lunch.   Interim History:  His weight remains the same.  Eating all of the food on the plan., Protein intake is as prescribed, Is exceeding snack calorie allotment, Is not skipping meals, Meeting protein goals., and Denies excessive cravings.  Hunger is moderately controlled.  Cravings are moderately controlled.  Assessment/Plan:   1. Vitamin D insufficiency Vitamin D Deficiency Vitamin D is at goal of 50.  Most recent vitamin D level was 56. He is on  prescription ergocalciferol 50,000 IU every 14 days. Lab Results  Component Value Date   VD25OH 56.9 02/15/2022   VD25OH 33.8 10/26/2021   VD25OH 38 04/22/2021    Plan: Refill prescription vitamin D 50,000 IU every other week # 6 with ) refills.   Hyperlipidemia LDL is at goal. Medication(s): Colestid, Lipitor Cardiovascular risk factors: advanced age (older than 58 for men, 71 for women), dyslipidemia, male gender, and obesity (BMI >= 30 kg/m2)  Lab Results  Component Value Date    CHOL 95 09/25/2022   HDL 33.20 (L) 09/25/2022   LDLCALC 39 09/25/2022   TRIG 118.0 09/25/2022   CHOLHDL 3 09/25/2022   Lab Results  Component Value Date   ALT 24 09/25/2022   AST 16 09/25/2022   ALKPHOS 73 09/25/2022   BILITOT 0.5 09/25/2022   The ASCVD Risk score (Arnett DK, et al., 2019) failed to calculate for the following reasons:   The valid total cholesterol range is 130 to 320 mg/dL  Plan:  Continue statin.  Will avoid all trans fats.  Will read labels Will minimize saturated fats except the following: low fat meats in moderation, diary, and limited dark chocolate.  Increase Omega 3 in foods, and consider an Omega 3 supplement.        Generalized Obesity: Current BMI BMI (Calculated): 35.8    Seldon is currently in the action stage of change. As such, his goal is to continue with weight loss efforts.  He has agreed to the Category 4 plan.  Exercise goals: All adults should avoid inactivity. Some physical activity is better than none, and adults who participate in any amount of physical activity gain some health benefits.  Behavioral modification strategies: increasing lean protein intake, decreasing simple carbohydrates , meal planning , planning for success, increasing vegetables, and mindful eating.  Ritter has agreed to follow-up with our clinic in 4 weeks.       Objective:   VITALS: Per patient if applicable, see vitals. GENERAL: Alert and in  no acute distress. CARDIOPULMONARY: No increased WOB. Speaking in clear sentences.  PSYCH: Pleasant and cooperative. Speech normal rate and rhythm. Affect is appropriate. Insight and judgement are appropriate. Attention is focused, linear, and appropriate.  NEURO: Oriented as arrived to appointment on time with no prompting.   Attestation Statements:  This was prepared with the assistance of Engineer, civil (consulting).  Occasional wrong-word or sound-a-like substitutions may have occurred due to the inherent limitations  of voice recognition software.   Corinna Capra, DO

## 2022-09-30 ENCOUNTER — Other Ambulatory Visit: Payer: Self-pay | Admitting: Nurse Practitioner

## 2022-10-03 ENCOUNTER — Other Ambulatory Visit: Payer: Self-pay | Admitting: Gastroenterology

## 2022-10-03 ENCOUNTER — Other Ambulatory Visit: Payer: Self-pay | Admitting: Cardiovascular Disease

## 2022-11-06 ENCOUNTER — Ambulatory Visit: Payer: 59 | Admitting: Bariatrics

## 2022-11-16 ENCOUNTER — Ambulatory Visit: Payer: 59 | Admitting: Bariatrics

## 2022-11-16 ENCOUNTER — Encounter: Payer: Self-pay | Admitting: Bariatrics

## 2022-11-16 VITALS — BP 116/76 | HR 57 | Temp 98.0°F | Ht 77.0 in | Wt 289.0 lb

## 2022-11-16 DIAGNOSIS — E669 Obesity, unspecified: Secondary | ICD-10-CM | POA: Diagnosis not present

## 2022-11-16 DIAGNOSIS — E782 Mixed hyperlipidemia: Secondary | ICD-10-CM | POA: Diagnosis not present

## 2022-11-16 DIAGNOSIS — Z6834 Body mass index (BMI) 34.0-34.9, adult: Secondary | ICD-10-CM | POA: Diagnosis not present

## 2022-11-16 DIAGNOSIS — E559 Vitamin D deficiency, unspecified: Secondary | ICD-10-CM | POA: Diagnosis not present

## 2022-11-16 MED ORDER — VITAMIN D3 1.25 MG (50000 UT) PO CAPS: ORAL_CAPSULE | ORAL | 0 refills | Status: DC

## 2022-11-16 NOTE — Progress Notes (Signed)
WEIGHT SUMMARY AND BIOMETRICS  Weight Lost Since Last Visit: 13lb  Weight Gained Since Last Visit: 0lb   Vitals Temp: 98 F (36.7 C) BP: 116/76 Pulse Rate: (!) 57 SpO2: 97 %   Anthropometric Measurements Height: 6\' 5"  (1.956 m) Weight: 289 lb (131.1 kg) BMI (Calculated): 34.26 Weight at Last Visit: 302lb Weight Lost Since Last Visit: 13lb Weight Gained Since Last Visit: 0lb Starting Weight: 327lb Total Weight Loss (lbs): 38 lb (17.2 kg)   Body Composition  Body Fat %: 32.3 % Fat Mass (lbs): 93.6 lbs Muscle Mass (lbs): 186.4 lbs Total Body Water (lbs): 131.4 lbs Visceral Fat Rating : 19   Other Clinical Data Fasting: No Labs: No Today's Visit #: 12 Starting Date: 10/26/21    OBESITY Dennis Castro is here to discuss his progress with his obesity treatment plan along with follow-up of his obesity related diagnoses.     Nutrition Plan: the Category 4 plan - 70% adherence.  Current exercise: cardiovascular workout on exercise equipment and weightlifting  Interim History:  He is 13 lbs since his last visit. He has been out of the country in [place].  Eating all of the food on the plan., Protein intake is as prescribed, and Water intake is adequate.  Hunger is moderately controlled.  Cravings are moderately controlled.  Assessment/Plan:   1. Vitamin D insufficiency  Vitamin D is at goal of 50.  Most recent vitamin D level was 56.9. He is on  prescription cholecalciferol 50,000 IU every 14 days. Lab Results  Component Value Date   VD25OH 56.9 02/15/2022   VD25OH 33.8 10/26/2021   VD25OH 38 04/22/2021    Plan: Refill prescription vitamin D3 50,000 IU every 14 days.  Mixed Hyperlipidemia: LDL is at goal. Medication(s): Lipitor, fish oil, Colestid  Cardiovascular risk factors: advanced age (older than 17 for men, 7 for women), dyslipidemia, male gender, obesity (BMI >= 30 kg/m2), and sedentary lifestyle  Lab Results  Component Value Date    CHOL 95 09/25/2022   HDL 33.20 (L) 09/25/2022   LDLCALC 39 09/25/2022   TRIG 118.0 09/25/2022   CHOLHDL 3 09/25/2022   Lab Results  Component Value Date   ALT 24 09/25/2022   AST 16 09/25/2022   ALKPHOS 73 09/25/2022   BILITOT 0.5 09/25/2022   The ASCVD Risk score (Arnett DK, et al., 2019) failed to calculate for the following reasons:   The valid total cholesterol range is 130 to 320 mg/dL  Plan:  Continue statin.  Information sheet on healthy vs unhealthy fats.  Will avoid all trans fats.  Will read labels Will minimize saturated fats except the following: low fat meats in moderation, diary, and limited dark chocolate.     Generalized Obesity: Current BMI BMI (Calculated): 34.26    Dennis Castro is currently in the action stage of change. As such, his goal is to continue with weight loss efforts.  He has agreed to the Category 2 plan ( discussed anti-inflammatory and Mediterranean options), and handouts provided.    Exercise goals: For substantial health benefits, adults should do at least 150 minutes (2 hours and 30 minutes) a week of moderate-intensity, or 75 minutes (1 hour and 15 minutes) a week of vigorous-intensity aerobic physical activity, or an equivalent combination of moderate- and vigorous-intensity aerobic activity. Aerobic activity should be performed in episodes of at least 10 minutes, and preferably, it should be spread throughout the week. He is increasing his exercise ( cardio and resistance ).   Behavioral  modification strategies: increasing lean protein intake, decreasing simple carbohydrates , decrease eating out, meal planning , increase water intake, better snacking choices, planning for success, avoiding temptations, and travel eating strategies.  Dennis Castro has agreed to follow-up with our clinic in 4 weeks.     Objective:   VITALS: Per patient if applicable, see vitals. GENERAL: Alert and in no acute distress. CARDIOPULMONARY: No increased WOB. Speaking  in clear sentences.  PSYCH: Pleasant and cooperative. Speech normal rate and rhythm. Affect is appropriate. Insight and judgement are appropriate. Attention is focused, linear, and appropriate.  NEURO: Oriented as arrived to appointment on time with no prompting.   Attestation Statements:    This was prepared with the assistance of Engineer, civil (consulting).  Occasional wrong-word or sound-a-like substitutions may have occurred due to the inherent limitations of voice recognition software.  Corinna Capra, DO

## 2022-12-31 ENCOUNTER — Encounter (INDEPENDENT_AMBULATORY_CARE_PROVIDER_SITE_OTHER): Payer: Self-pay

## 2023-01-01 ENCOUNTER — Ambulatory Visit: Payer: 59 | Admitting: Bariatrics

## 2023-01-03 ENCOUNTER — Ambulatory Visit (HOSPITAL_COMMUNITY): Payer: 59 | Attending: Cardiology

## 2023-01-03 ENCOUNTER — Other Ambulatory Visit (HOSPITAL_COMMUNITY): Payer: 59

## 2023-01-03 DIAGNOSIS — I2510 Atherosclerotic heart disease of native coronary artery without angina pectoris: Secondary | ICD-10-CM | POA: Diagnosis not present

## 2023-01-03 DIAGNOSIS — E782 Mixed hyperlipidemia: Secondary | ICD-10-CM

## 2023-01-03 LAB — ECHOCARDIOGRAM COMPLETE
Area-P 1/2: 2.47 cm2
S' Lateral: 3.3 cm

## 2023-01-10 ENCOUNTER — Other Ambulatory Visit: Payer: Self-pay | Admitting: Gastroenterology

## 2023-01-11 ENCOUNTER — Ambulatory Visit: Payer: 59 | Admitting: Bariatrics

## 2023-01-11 ENCOUNTER — Telehealth: Payer: Self-pay | Admitting: Gastroenterology

## 2023-01-11 MED ORDER — COLESTIPOL HCL 1 G PO TABS: 1.0000 g | ORAL_TABLET | Freq: Two times a day (BID) | ORAL | 1 refills | Status: DC | PRN

## 2023-01-11 NOTE — Telephone Encounter (Signed)
Prescription sent to patient's pharmacy until scheduled appt with Dr. Adela Lank in January.

## 2023-01-11 NOTE — Telephone Encounter (Signed)
Patient requesting medication refill for Colestid.   Verifying pharmacy , [facility] in [place].   Patient scheduled for next available f/u as well.   Please advise. Thank you

## 2023-01-12 ENCOUNTER — Other Ambulatory Visit (HOSPITAL_COMMUNITY): Payer: Self-pay

## 2023-01-12 DIAGNOSIS — I7121 Aneurysm of the ascending aorta, without rupture: Secondary | ICD-10-CM

## 2023-01-12 DIAGNOSIS — E782 Mixed hyperlipidemia: Secondary | ICD-10-CM

## 2023-01-12 DIAGNOSIS — I2510 Atherosclerotic heart disease of native coronary artery without angina pectoris: Secondary | ICD-10-CM

## 2023-01-17 ENCOUNTER — Ambulatory Visit: Payer: 59 | Admitting: Bariatrics

## 2023-01-17 ENCOUNTER — Encounter: Payer: Self-pay | Admitting: Bariatrics

## 2023-01-17 VITALS — BP 125/83 | HR 43 | Temp 97.8°F | Ht 77.0 in | Wt 288.0 lb

## 2023-01-17 DIAGNOSIS — E88819 Insulin resistance, unspecified: Secondary | ICD-10-CM

## 2023-01-17 DIAGNOSIS — E669 Obesity, unspecified: Secondary | ICD-10-CM

## 2023-01-17 DIAGNOSIS — E559 Vitamin D deficiency, unspecified: Secondary | ICD-10-CM | POA: Diagnosis not present

## 2023-01-17 DIAGNOSIS — Z6834 Body mass index (BMI) 34.0-34.9, adult: Secondary | ICD-10-CM | POA: Diagnosis not present

## 2023-01-17 DIAGNOSIS — E66811 Obesity, class 1: Secondary | ICD-10-CM

## 2023-01-17 MED ORDER — VITAMIN D3 1.25 MG (50000 UT) PO CAPS: ORAL_CAPSULE | ORAL | 0 refills | Status: DC

## 2023-01-17 NOTE — Progress Notes (Signed)
WEIGHT SUMMARY AND BIOMETRICS  Weight Lost Since Last Visit: 1lb  Weight Gained Since Last Visit: 0   Vitals Temp: 97.8 F (36.6 C) BP: 125/83 Pulse Rate: (!) 43 SpO2: 97 %   Anthropometric Measurements Height: 6\' 5"  (1.956 m) Weight: 288 lb (130.6 kg) BMI (Calculated): 34.14 Weight at Last Visit: 289lb Weight Lost Since Last Visit: 1lb Weight Gained Since Last Visit: 0 Starting Weight: 327lb Total Weight Loss (lbs): 39 lb (17.7 kg)   Body Composition  Body Fat %: 32.6 % Fat Mass (lbs): 93.8 lbs Muscle Mass (lbs): 184.8 lbs Total Body Water (lbs): 130.8 lbs Visceral Fat Rating : 19   Other Clinical Data Fasting: no Labs: no Today's Visit #: 13 Starting Date: 10/26/21    OBESITY Darel is here to discuss his progress with his obesity treatment plan along with follow-up of his obesity related diagnoses.     Nutrition Plan: the Category 4 plan - 65% adherence.  Current exercise: cardiovascular workout on exercise equipment and weightlifting  Interim History:  He is down 1 lb since his last visit. He still grazes at times on carbohydrates.  Eating all of the food on the plan., Protein intake is as prescribed, and Water intake is inadequate.  Hunger is moderately controlled.  Cravings are moderately controlled.  Assessment/Plan:   1. Vitamin D insufficiency Vitamin D Deficiency Vitamin D is at goal of 50.  Most recent vitamin D level was 56.9. He is on  prescription ergocalciferol 50,000 IU weekly. Lab Results  Component Value Date   VD25OH 56.9 02/15/2022   VD25OH 33.8 10/26/2021   VD25OH 38 04/22/2021    Plan: Refill prescription vitamin D 50,000 IU weekly.  He will have some sun exposure.   Insulin Resistance Cheryl has had elevated fasting insulin readings. Goal is HgbA1c < 5.7, fasting insulin at l0 or less, and preferably at 5.  He reports no excessive polyphagia. Medication(s): none Lab Results  Component Value Date    HGBA1C 5.4 09/25/2022   Lab Results  Component Value Date   INSULIN 29.1 (H) 02/15/2022   INSULIN 29.4 (H) 10/26/2021    Plan Will work on the agreed upon plan. Will minimize refined carbohydrates ( sweets and starches), and focus more on complex carbohydrates.  Increase the micronutrients found in leafy greens, which include magnesium, polyphenols, and vitamin C which have been postulated to help with insulin sensitivity. Minimize "fast food" and cook more meals at home.  Increase fiber to 25 to 30 grams daily.  He will stay close to his 1,800 calories and protein.    Generalized Obesity: Current BMI BMI (Calculated): 34.14    Ezzard is currently in the action stage of change. As such, his goal is to continue with weight loss efforts.  He has agreed to the Category 4 plan.  Exercise goals: All adults should avoid inactivity. Some physical activity is better than none, and adults who participate in any amount of physical activity gain some health benefits. He is doing both weights and cardio. He has been able to go up on his weights. He will commit to do more cardio.   Behavioral modification strategies: increasing lean protein intake, decreasing simple carbohydrates , no meal skipping, meal planning , increase water intake, planning for success, and keep healthy foods in the home.  Atsushi has agreed to follow-up with our clinic in 4 weeks.    Objective:   VITALS: Per patient if applicable, see vitals. GENERAL: Alert and in no acute  distress. CARDIOPULMONARY: No increased WOB. Speaking in clear sentences.  PSYCH: Pleasant and cooperative. Speech normal rate and rhythm. Affect is appropriate. Insight and judgement are appropriate. Attention is focused, linear, and appropriate.  NEURO: Oriented as arrived to appointment on time with no prompting.   Attestation Statements:   This was prepared with the assistance of Engineer, civil (consulting).  Occasional wrong-word or sound-a-like  substitutions may have occurred due to the inherent limitations of voice recognition software.   Corinna Capra, DO

## 2023-01-25 ENCOUNTER — Encounter (HOSPITAL_BASED_OUTPATIENT_CLINIC_OR_DEPARTMENT_OTHER): Payer: Self-pay | Admitting: Emergency Medicine

## 2023-01-25 ENCOUNTER — Emergency Department (HOSPITAL_BASED_OUTPATIENT_CLINIC_OR_DEPARTMENT_OTHER)
Admission: EM | Admit: 2023-01-25 | Discharge: 2023-01-25 | Disposition: A | Discharge status: Home / Self Care | Payer: 59 | Attending: Emergency Medicine | Admitting: Emergency Medicine

## 2023-01-25 DIAGNOSIS — G510 Bell's palsy: Secondary | ICD-10-CM | POA: Diagnosis not present

## 2023-01-25 DIAGNOSIS — R29810 Facial weakness: Secondary | ICD-10-CM | POA: Diagnosis present

## 2023-01-25 DIAGNOSIS — H9202 Otalgia, left ear: Secondary | ICD-10-CM | POA: Diagnosis not present

## 2023-01-25 DIAGNOSIS — Z7982 Long term (current) use of aspirin: Secondary | ICD-10-CM | POA: Diagnosis not present

## 2023-01-25 MED ORDER — VALACYCLOVIR HCL 1 G PO TABS: 1000.0000 mg | ORAL_TABLET | Freq: Three times a day (TID) | ORAL | 0 refills | Status: DC

## 2023-01-25 MED ORDER — PREDNISONE 10 MG PO TABS
60.0000 mg | ORAL_TABLET | Freq: Every day | ORAL | 0 refills | Status: AC
Start: 2023-01-25 — End: 2023-02-01

## 2023-01-25 MED ORDER — PREDNISONE 50 MG PO TABS
60.0000 mg | ORAL_TABLET | Freq: Once | ORAL | Status: AC
  Administered 2023-01-25: 60 mg via ORAL
  Filled 2023-01-25: qty 1

## 2023-01-25 MED ORDER — VALACYCLOVIR HCL 500 MG PO TABS
1000.0000 mg | ORAL_TABLET | Freq: Once | ORAL | Status: AC
  Administered 2023-01-25: 1000 mg via ORAL
  Filled 2023-01-25: qty 2

## 2023-01-25 MED ORDER — ERYTHROMYCIN 5 MG/GM OP OINT: TOPICAL_OINTMENT | OPHTHALMIC | 0 refills | Status: DC

## 2023-01-25 NOTE — ED Triage Notes (Addendum)
Pt state had jaw and ear pain since Monday, felt some numbness on left side of tongue last night before bed. Woke up this morning and noticed left facial drooping.

## 2023-01-25 NOTE — ED Provider Notes (Signed)
[facility] Provider Note   CSN: [id number] Arrival date & time: 01/25/23  0347     History  Chief Complaint  Patient presents with   Facial Droop    Dennis Castro. is a 62 y.o. male.  The history is provided by the patient and medical records.  Dennis Castro. is a 62 y.o. male who presents to the Emergency Department complaining of weakness.  He presents to the emergency department for evaluation of left-sided facial weakness.  He states that over the last few days he had some soreness in his left ear and jaw that he thought was a dental issue.  Last night he noticed that he had some tingling to the left side of his tongue and tearing to his left eye.  This morning when he woke up he had paralysis of the left side of his face.   No prior similar symptoms.  He does have a history of prediabetes.  Home Medications Prior to Admission medications   Medication Sig Start Date End Date Taking? Authorizing Provider  erythromycin ophthalmic ointment Place a 1/2 inch ribbon of ointment into the lower eyelid. 01/25/23  Yes Tilden Fossa, MD  predniSONE (DELTASONE) 10 MG tablet Take 6 tablets (60 mg total) by mouth daily for 7 days. 01/25/23 02/01/23 Yes Tilden Fossa, MD  valACYclovir (VALTREX) 1000 MG tablet Take 1 tablet (1,000 mg total) by mouth 3 (three) times daily. 01/25/23  Yes Tilden Fossa, MD  ALPHA LIPOIC ACID PO Take 600 mg by mouth daily.    [provider]  aspirin EC 81 MG tablet Take 81 mg by mouth daily.    [provider]  atorvastatin (LIPITOR) 20 MG tablet TAKE ONE TABLET BY MOUTH EVERY NIGHT AT BEDTIME 10/03/22   Runell Gess, MD  Cholecalciferol (VITAMIN D3) 1.25 MG (50000 UT) CAPS Take one po every 14 days 01/17/23   Corinna Capra A, DO  colestipol (COLESTID) 1 g tablet Take 1 tablet (1 g total) by mouth 2 (two) times daily as needed. Please schedule a yearly follow up for further refills.  Thank you 01/11/23   Armbruster, Willaim Rayas, MD  methylcellulose (CITRUCEL) oral powder If needed 06/01/21   Armbruster, Willaim Rayas, MD  metoprolol tartrate (LOPRESSOR) 25 MG tablet TAKE 0.5 TABLET (12.5 MG TOTAL) BY MOUTHTWICE DAILY 10/02/22   Runell Gess, MD  Multiple Vitamin (MULTIVITAMIN WITH MINERALS) TABS tablet Take 1 tablet by mouth daily.    [provider]  Omega-3 Fatty Acids (FISH OIL) 1000 MG CAPS Take 2 capsules (2,000 mg total) by mouth daily. 07/19/21   Gaston Islam., NP  sildenafil (REVATIO) 20 MG tablet Take 3-4 tablets (60-80 mg total) by mouth at bedtime as needed. 04/22/21   Wanda Plump, MD      Allergies    Patient has no known allergies.    Review of Systems   Review of Systems  All other systems reviewed and are negative.   Physical Exam Updated Vital Signs BP 119/81 (BP Location: Right Arm)   Pulse (!) 58   Temp 97.6 F (36.4 C) (Oral)   Resp 20   Ht 6\' 5"  (1.956 m)   Wt 131.5 kg   SpO2 97%   BMI 34.39 kg/m  Physical Exam Vitals and nursing note reviewed.  Constitutional:      Appearance: He is well-developed.  HENT:     Head: Normocephalic and atraumatic.  Right Ear: Tympanic membrane normal.     Left Ear: Tympanic membrane normal.  Eyes:     Extraocular Movements: Extraocular movements intact.     Pupils: Pupils are equal, round, and reactive to light.  Cardiovascular:     Rate and Rhythm: Normal rate and regular rhythm.     Heart sounds: No murmur heard. Pulmonary:     Effort: Pulmonary effort is normal. No respiratory distress.     Breath sounds: Normal breath sounds.  Abdominal:     Palpations: Abdomen is soft.     Tenderness: There is no abdominal tenderness. There is no guarding or rebound.  Musculoskeletal:        General: No tenderness.  Skin:    General: Skin is warm and dry.  Neurological:     Mental Status: He is alert and oriented to person, place, and time.     Comments: There is a dense left-sided facial droop.   This involves the upper and lower face.  He is unable to fully close his eye.  Pupils equal round and reactive, EOMI.  Tongue is midline.  He does have sensation to light touch on bilateral face.  5 out of 5 strength in bilateral upper and lower extremities with sensation to light touch intact in bilateral upper and lower extremities.  Psychiatric:        Behavior: Behavior normal.     ED Results / Procedures / Treatments   Labs (all labs ordered are listed, but only abnormal results are displayed) Labs Reviewed - No data to display  EKG None  Radiology No results found.  Procedures Procedures    Medications Ordered in ED Medications  valACYclovir (VALTREX) tablet 1,000 mg (1,000 mg Oral Given 01/25/23 0640)  predniSONE (DELTASONE) tablet 60 mg (60 mg Oral Given 01/25/23 1610)    ED Course/ Medical Decision Making/ A&P                                 Medical Decision Making Risk Prescription drug management.   Patient with history of prediabetes here for evaluation of left sided facial droop that started this morning although he did have some mild symptoms last night.  He does have a complete facial droop on the left upper and lower face consistent with Bell's palsy.  There are no lesions in the ear canal although he does have some ear discomfort.  He does not have any additional neurologic deficits.  Presentation is not consistent with CVA, aneurysm, space-occupying lesion.  Discussed with patient home care for Bell's palsy.  Will start prednisone, Valtrex.  Discussed getting lubricating eyedrops.  Will prescribe erythromycin ointment for nighttime.  Discussed PCP follow-up as well as return precautions.        Final Clinical Impression(s) / ED Diagnoses Final diagnoses:  Bell palsy    Rx / DC Orders ED Discharge Orders          Ordered    valACYclovir (VALTREX) 1000 MG tablet  3 times daily        01/25/23 0632    predniSONE (DELTASONE) 10 MG tablet  Daily         01/25/23 0632    erythromycin ophthalmic ointment        01/25/23 9604              Tilden Fossa, MD 01/25/23 [phone]

## 2023-01-31 ENCOUNTER — Ambulatory Visit: Payer: 59 | Admitting: Internal Medicine

## 2023-01-31 ENCOUNTER — Encounter: Payer: Self-pay | Admitting: Internal Medicine

## 2023-01-31 VITALS — BP 122/70 | HR 58 | Temp 98.4°F | Resp 18 | Ht 77.0 in | Wt 308.2 lb

## 2023-01-31 DIAGNOSIS — G510 Bell's palsy: Secondary | ICD-10-CM | POA: Diagnosis not present

## 2023-01-31 MED ORDER — ERYTHROMYCIN 5 MG/GM OP OINT: TOPICAL_OINTMENT | OPHTHALMIC | 0 refills | Status: AC

## 2023-01-31 NOTE — Progress Notes (Signed)
Subjective:    Patient ID: Dennis Castro., male    DOB: Sep 11, 1960, 63 y.o.   MRN: [id number]  DOS:  01/31/2023 Type of visit - description: ER f/u  ER  01/25/2023. Dx with Bell's palsy, left-sided. Was prescribed prednisone and valacyclovir which he is about to finish. He denies any headache, no other weaknesses.  No rash. Prior to the onset of Bell's palsy he has some pain around the left ear, that is much better.  Review of Systems See above   Past Medical History:  Diagnosis Date   Anxiety    Arthritis    Atypical chest pain    Back pain    Cholelithiasis 09/2015   determined by CT, asymptomatic   Constipation    Coronary artery disease    Diverticulitis    Family history of heart disease    GERD (gastroesophageal reflux disease)    diet related   Hyperlipidemia    IBS (irritable bowel syndrome)    Joint pain    Seasonal allergies    Swelling of lower extremity     Past Surgical History:  Procedure Laterality Date   CARDIAC CATHETERIZATION  04/03/2008   Dr. Allyson Sabal; less than 30% occlusive disease   CHOLECYSTECTOMY N/A 06/14/2018   Procedure: LAPAROSCOPIC CHOLECYSTECTOMY;  Surgeon: Axel Filler, MD;  Location[facility] OR;  Service: General;  Laterality: N/A;   CT CTA CORONARY W/CA SCORE W/CM &/OR WO/CM  08/04/2021   CT FFR analysis showed flow limitations in the  distal tip of the LAD.Coronary calcium score of 212   INSERTION OF MESH  12/13/2018   Procedure: Insertion Of Mesh;  Surgeon: Axel Filler, MD;  Location: [facility] OR;  Service: General;;   TONSILLECTOMY     age 29   TRANSTHORACIC ECHOCARDIOGRAM  08/02/2021   EF 55-60%,Grade I DD,mild LVH,mild aortic dialation of 43mm   UMBILICAL HERNIA REPAIR N/A 12/13/2018   Procedure: LAPAROSCOPIC UMBILICAL HERNIA REPAIR;  Surgeon: Axel Filler, MD;  Location: [facility] OR;  Service: General;  Laterality: N/A;    Current Outpatient Medications  Medication Instructions   ALPHA LIPOIC ACID PO 600 mg, Oral, Daily    aspirin EC 81 mg, Oral, Daily   atorvastatin (LIPITOR) 20 mg, Oral, Daily at bedtime   Cholecalciferol (VITAMIN D3) 1.25 MG (50000 UT) CAPS Take one po every 14 days   colestipol (COLESTID) 1 g, Oral, 2 times daily PRN, Please schedule a yearly follow up for further refills. Thank you   erythromycin ophthalmic ointment Place a 1/2 inch ribbon of ointment into the lower eyelid.   Fish Oil 2,000 mg, Oral, Daily   methylcellulose (CITRUCEL) oral powder If needed   metoprolol tartrate (LOPRESSOR) 25 MG tablet TAKE 0.5 TABLET (12.5 MG TOTAL) BY MOUTHTWICE DAILY   Multiple Vitamin (MULTIVITAMIN WITH MINERALS) TABS tablet 1 tablet, Oral, Daily   predniSONE (DELTASONE) 60 mg, Oral, Daily   sildenafil (REVATIO) 60-80 mg, Oral, At bedtime PRN   valACYclovir (VALTREX) 1,000 mg, Oral, 3 times daily       Objective:   Physical Exam BP 122/70   Pulse (!) 58   Temp 98.4 F (36.9 C) (Oral)   Resp 18   Ht 6\' 5"  (1.956 m)   Wt (!) 308 lb 4 oz (139.8 kg)   SpO2 95%   BMI 36.55 kg/m  General:   Well developed, NAD, BMI noted. HEENT:  Normocephalic . Face symmetric, atraumatic Left conjunctiva slightly irritated Lower extremities: no pretibial edema bilaterally  Skin: Not  pale. Not jaundice Neurologic:  alert & oriented X3.  Speech normal, + left-sided facial paralysis including the forehead. Eyelids: Unable to completely close the left eye. Psych--  Cognition and judgment appear intact.  Cooperative with normal attention span and concentration.  Behavior appropriate. No anxious or depressed appearing.      Assessment    Assessment   Hyperlipidemia   GERD Obesity , Morbid  CAD:  --Chest pain: Myoview stress test subtle anterior ischemia --Cardiac cath 06-24-09: Noncritical 30% LAD with subtle anteroapical HK. -- CTA 08/2021: Calcium score 212, see full report. --Thoracic aortic aneurysm Diverticulitis Seasonal allergies +FH prostate ca (father age 74) +FH CAD (mother age  3) kidney  Stones Neuropathy (Dx at neurology 06-2021) MUTYH  heterozygous mutation (per GI note reviewed)   PLAN: Bell's palsy: Left-sided, started 6 days ago, treated with Valtrex and prednisone by the emergency room. Has developed some insomnia and has gained weight, he thinks  from prednisone. Patient is worried about the diagnosis but he also understand that he has a 70% chance to be close to normal in 6 months and 85% chance at 1 year. We did talk about eye care, encouraged eyedrops every 2 hours during the daytime, he was prescribed an ointment to use at night and I sent a RF. Also recommend to see the eye doctor and he plans to call them. 2 weeks prior to this episode, had flu and COVID-vaccine, I reviewed the literature with the patient and there is no clear-cut evidence of a cause-and-effect between vaccines and Bell's Palsy

## 2023-01-31 NOTE — Assessment & Plan Note (Signed)
Bell's palsy: Left-sided, started 6 days ago, treated with Valtrex and prednisone by the emergency room. Has developed some insomnia and has gained weight, he thinks  from prednisone. Patient is worried about the diagnosis but he also understand that he has a 70% chance to be close to normal in 6 months and 85% chance at 1 year. We did talk about eye care, encouraged eyedrops every 2 hours during the daytime, he was prescribed an ointment to use at night and I sent a RF. Also recommend to see the eye doctor and he plans to call them. 2 weeks prior to this episode, had flu and COVID-vaccine, I reviewed the literature with the patient and there is no clear-cut evidence of a cause-and-effect between vaccines and Bell's Palsy

## 2023-02-05 ENCOUNTER — Other Ambulatory Visit: Payer: Self-pay | Admitting: Cardiovascular Disease

## 2023-02-22 ENCOUNTER — Ambulatory Visit: Payer: 59 | Admitting: Bariatrics

## 2023-03-08 ENCOUNTER — Encounter: Payer: Self-pay | Admitting: Bariatrics

## 2023-03-08 ENCOUNTER — Ambulatory Visit: Payer: 59 | Admitting: Bariatrics

## 2023-03-08 VITALS — BP 108/74 | HR 61 | Temp 98.2°F | Ht 77.0 in | Wt 301.0 lb

## 2023-03-08 DIAGNOSIS — E669 Obesity, unspecified: Secondary | ICD-10-CM | POA: Diagnosis not present

## 2023-03-08 DIAGNOSIS — Z6835 Body mass index (BMI) 35.0-35.9, adult: Secondary | ICD-10-CM

## 2023-03-08 DIAGNOSIS — E559 Vitamin D deficiency, unspecified: Secondary | ICD-10-CM

## 2023-03-08 DIAGNOSIS — E785 Hyperlipidemia, unspecified: Secondary | ICD-10-CM | POA: Diagnosis not present

## 2023-03-08 DIAGNOSIS — E782 Mixed hyperlipidemia: Secondary | ICD-10-CM

## 2023-03-08 MED ORDER — VITAMIN D3 1.25 MG (50000 UT) PO CAPS: ORAL_CAPSULE | ORAL | 0 refills | Status: DC

## 2023-03-08 NOTE — Progress Notes (Signed)
WEIGHT SUMMARY AND BIOMETRICS  Weight Lost Since Last Visit: 0  Weight Gained Since Last Visit: 13lb   Vitals Temp: 98.2 F (36.8 C) BP: 108/74 Pulse Rate: 61 SpO2: 92 %   Anthropometric Measurements Height: 6\' 5"  (1.956 m) Weight: (!) 301 lb (136.5 kg) BMI (Calculated): 35.69 Weight at Last Visit: 288lb Weight Lost Since Last Visit: 0 Weight Gained Since Last Visit: 13lb Starting Weight: 327lb Total Weight Loss (lbs): 26 lb (11.8 kg)   Body Composition  Body Fat %: 34.2 % Fat Mass (lbs): 103.2 lbs Muscle Mass (lbs): 188.8 lbs Total Body Water (lbs): 137.8 lbs Visceral Fat Rating : 20   Other Clinical Data Fasting: no Labs: no Today's Visit #: 14 Starting Date: 10/26/21    OBESITY Dennis Castro is here to discuss his progress with his obesity treatment plan along with follow-up of his obesity related diagnoses.    Nutrition Plan: the Category 4 plan - 25% adherence.  Current exercise: none  Interim History:  He is up 13 lbs since his last visit. He has been diagnosed with Bell's palsy and has been on prednisone. His body water is up about 7 lbs per the bio-impedence scale Eating all of the food on the plan., Protein intake is as prescribed, Is not skipping meals, Water intake is adequate., and Reports polyphagia  Hunger is moderately controlled.  Cravings are moderately controlled.  Assessment/Plan:   1. Vitamin D insufficiency Vitamin D Deficiency Vitamin D is at goal of 50.  Most recent vitamin D level was 56.9. He is on  prescription cholecalciferol 50,000 IU every 14 days. Lab Results  Component Value Date   VD25OH 56.9 02/15/2022   VD25OH 33.8 10/26/2021   VD25OH 38 04/22/2021    Plan: Refill prescription vitamin D 3 50,000 IU \\every  other week.   Hyperlipidemia LDL is at goal. Medication(s): Lipitor and Omega -3 fatty acids.   Cardiovascular risk factors: advanced age (older than 27 for men, 43 for women), dyslipidemia, male gender, obesity (BMI >= 30 kg/m2), and sedentary lifestyle  Lab Results  Component Value Date   CHOL 95 09/25/2022   HDL 33.20 (L) 09/25/2022   LDLCALC 39 09/25/2022   TRIG 118.0 09/25/2022   CHOLHDL 3 09/25/2022   Lab Results  Component Value Date   ALT 24 09/25/2022   AST 16 09/25/2022   ALKPHOS 73 09/25/2022   BILITOT 0.5 09/25/2022   The ASCVD Risk score (Arnett DK, et al., 2019) failed to calculate for the following reasons:   The valid total cholesterol range is 130 to 320 mg/dL  Plan:  Continue statin, Colestid and Omega 3.  Will avoid all trans fats.  Will read labels Will minimize saturated fats except the following: low fat meats in moderation, diary, and limited dark chocolate.  He will continue to track his food.  He will increase his water.  Generalized Obesity: Current BMI BMI (Calculated): 35.69    Dennis Castro is currently in the action stage of change. As such, his goal is to continue with weight loss efforts.  He has agreed to the Category 4 plan.  Exercise goals: All adults should avoid inactivity. Some physical activity is better than none, and adults who participate in any amount of physical activity gain some health benefits. He will resume his exercise, both cardio and resistance.   Behavioral modification strategies: increasing lean protein intake, meal planning , increase water intake, better snacking choices, planning for success, increasing vegetables, and get rid of junk food in the home.  Dennis Castro has agreed to follow-up with our clinic in 4 weeks ( IC and fasting labs ).       Objective:   VITALS: Per patient if applicable, see vitals. GENERAL: Alert and in no acute distress. CARDIOPULMONARY: No increased WOB. Speaking in clear sentences.  PSYCH: Pleasant and cooperative. Speech normal rate and rhythm. Affect is appropriate. Insight and  judgement are appropriate. Attention is focused, linear, and appropriate.  NEURO: Oriented as arrived to appointment on time with no prompting.   Attestation Statements:    This was prepared with the assistance of Engineer, civil (consulting).  Occasional wrong-word or sound-a-like substitutions may have occurred due to the inherent limitations of voice recognition    Corinna Capra, DO

## 2023-03-09 ENCOUNTER — Ambulatory Visit: Payer: 59 | Admitting: Medical

## 2023-03-09 VITALS — BP 120/72 | HR 65 | Temp 98.0°F | Resp 16 | Ht 77.0 in | Wt 308.0 lb

## 2023-03-09 DIAGNOSIS — M545 Low back pain, unspecified: Secondary | ICD-10-CM

## 2023-03-09 MED ORDER — KETOROLAC TROMETHAMINE 30 MG/ML IJ SOLN
30.0000 mg | Freq: Once | INTRAMUSCULAR | Status: AC
  Administered 2023-03-09: 30 mg via INTRAMUSCULAR

## 2023-03-09 MED ORDER — TIZANIDINE HCL 4 MG PO TABS: 4.0000 mg | ORAL_TABLET | Freq: Three times a day (TID) | ORAL | 0 refills | Status: AC | PRN

## 2023-03-09 MED ORDER — MELOXICAM 15 MG PO TABS: 15.0000 mg | ORAL_TABLET | Freq: Every day | ORAL | 0 refills | Status: DC

## 2023-03-09 NOTE — Addendum Note (Signed)
Addended by: Marian Sorrow D on: 03/09/2023 03:18 PM   Modules accepted: Orders

## 2023-03-09 NOTE — Progress Notes (Signed)
Subjective:    Patient ID: Dennis Castro., male    DOB: 30-Jan-1961, 62 y.o.   MRN: [id number]  HPI  Discussed the use of AI scribe software for clinical note transcription with the patient, who gave verbal consent to proceed.  History of Present Illness   The patient, with a history of right-sided lower back pain, presents with a recent change in the pattern of his back pain. He reports a longstanding dull, low-level pain in the right lower back, which he has managed for years. This pain typically presents as soreness after physical activities such as gym workouts or yard work, and usually resolves by the next day.  However, over the past few days, the patient has experienced a shift in his symptoms. He now describes a sharp, acute pain in the same area that is triggered by certain movements, such as twisting or bending over. This pain is severe enough to momentarily take his breath away and force him to stop moving until it subsides. He likens the sensation to a nerve being pinched or inflamed.  The patient denies any radiating pain down the leg, leg weakness, or numbness, except for a brief moment of numbness in the back during the acute episodes of sharp pain. He has tried managing this new pain with Aleve, which previously helped with the dull soreness but has proven ineffective for the current sharp pain.  This change in the pattern of pain started a few days ago and has gradually worsened. The patient sought medical attention due to the severity of the pain, which he found impossible to ignore. He recalls a similar episode of acute right-sided lower back pain about two years ago, for which he was prescribed meloxicam and Zanaflex.        Review of Systems  Constitutional:  Negative for chills, fatigue and fever.  Respiratory:  Negative for cough, chest tightness and wheezing.   Cardiovascular:  Negative for chest pain and palpitations.  Gastrointestinal:  Negative for abdominal  pain, diarrhea and vomiting.       No flank/renal colic type pain.  Genitourinary:  Negative for dysuria, flank pain and frequency.  Musculoskeletal:  Positive for back pain. Negative for myalgias and neck stiffness.  Skin:  Negative for rash.  Neurological:  Negative for dizziness.  Hematological:  Negative for adenopathy. Does not bruise/bleed easily.  Psychiatric/Behavioral:  Negative for behavioral problems.    Past Medical History:  Diagnosis Date   Anxiety    Arthritis    Atypical chest pain    Back pain    Cholelithiasis 09/2015   determined by CT, asymptomatic   Constipation    Coronary artery disease    Diverticulitis    Family history of heart disease    GERD (gastroesophageal reflux disease)    diet related   Hyperlipidemia    IBS (irritable bowel syndrome)    Joint pain    Seasonal allergies    Swelling of lower extremity      Social History   Socioeconomic History   Marital status: Married    Spouse name: [name]   Number of children: 1   Years of education: Not on file   Highest education level: Not on file  Occupational History   Occupation: Production designer, theatre/television/film, office job  Tobacco Use   Smoking status: Never   Smokeless tobacco: Never  Vaping Use   Vaping status: Never Used  Substance and Sexual Activity   Alcohol use: No   Drug use:  No   Sexual activity: Yes  Other Topics Concern   Not on file  Social History Narrative   Teenage son [name], still at home   Social Determinants of Health   Financial Resource Strain: Not on file  Food Insecurity: Not on file  Transportation Needs: Not on file  Physical Activity: Not on file  Stress: Not on file  Social Connections: Not on file  Intimate Partner Violence: Not on file    Past Surgical History:  Procedure Laterality Date   CARDIAC CATHETERIZATION  04/03/2008   Dr. Allyson Sabal; less than 30% occlusive disease   CHOLECYSTECTOMY N/A 06/14/2018   Procedure: LAPAROSCOPIC CHOLECYSTECTOMY;  Surgeon: Axel Filler, MD;  Location: [facility] OR;  Service: General;  Laterality: N/A;   CT CTA CORONARY W/CA SCORE W/CM &/OR WO/CM  08/04/2021   CT FFR analysis showed flow limitations in the  distal tip of the LAD.Coronary calcium score of 212   INSERTION OF MESH  12/13/2018   Procedure: Insertion Of Mesh;  Surgeon: Axel Filler, MD;  Location: [facility] OR;  Service: General;;   TONSILLECTOMY     age 85   TRANSTHORACIC ECHOCARDIOGRAM  08/02/2021   EF 55-60%,Grade I DD,mild LVH,mild aortic dialation of 43mm   UMBILICAL HERNIA REPAIR N/A 12/13/2018   Procedure: LAPAROSCOPIC UMBILICAL HERNIA REPAIR;  Surgeon: Axel Filler, MD;  Location: [facility] OR;  Service: General;  Laterality: N/A;    Family History  Problem Relation Age of Onset   Diabetes Mother 65   CAD Mother 90       CBAG   Colon cancer Mother    High blood pressure Mother    Heart disease Mother    Neuropathy Mother    Cancer Father        prostate cancer and panceatic cancer   Prostate cancer Father    Breast cancer Maternal Grandmother    Pancreatic cancer Paternal Grandfather    Stroke Neg Hx    Esophageal cancer Neg Hx    Rectal cancer Neg Hx    Stomach cancer Neg Hx     No Known Allergies  Current Outpatient Medications on File Prior to Visit  Medication Sig Dispense Refill   ALPHA LIPOIC ACID PO Take 600 mg by mouth daily.     aspirin EC 81 MG tablet Take 81 mg by mouth daily.     atorvastatin (LIPITOR) 20 MG tablet TAKE ONE TABLET BY MOUTH EVERY NIGHT AT BEDTIME 30 tablet 3   Cholecalciferol (VITAMIN D3) 1.25 MG (50000 UT) CAPS Take one po every 14 days 6 capsule 0   colestipol (COLESTID) 1 g tablet Take 1 tablet (1 g total) by mouth 2 (two) times daily as needed. Please schedule a yearly follow up for further refills. Thank you 60 tablet 1   erythromycin ophthalmic ointment Place a 1/2 inch ribbon of ointment into the lower eyelid. 3.5 g 0   methylcellulose (CITRUCEL) oral powder If needed     metoprolol tartrate (LOPRESSOR) 25  MG tablet TAKE 0.5 TABLET (12.5 MG TOTAL) BY MOUTHTWICE DAILY 90 tablet 1   Multiple Vitamin (MULTIVITAMIN WITH MINERALS) TABS tablet Take 1 tablet by mouth daily.     Omega-3 Fatty Acids (FISH OIL) 1000 MG CAPS Take 2 capsules (2,000 mg total) by mouth daily. 60 capsule 12   sildenafil (REVATIO) 20 MG tablet Take 3-4 tablets (60-80 mg total) by mouth at bedtime as needed. 30 tablet 3   valACYclovir (VALTREX) 1000 MG tablet Take 1 tablet (1,000 mg  total) by mouth 3 (three) times daily. 21 tablet 0   No current facility-administered medications on file prior to visit.    BP 120/72 (BP Location: Right Arm, Patient Position: Sitting, Cuff Size: Large)   Pulse 65   Temp 98 F (36.7 C) (Oral)   Resp 16   Ht 6\' 5"  (1.956 m)   Wt (!) 308 lb (139.7 kg)   SpO2 95%   BMI 36.52 kg/m         Objective:   Physical Exam  General Appearance- Not in acute distress.   Chest and Lung Exam Auscultation: Breath sounds:-Normal. Clear even and unlabored. Adventitious sounds:- No Adventitious sounds.  Cardiovascular Auscultation:Rythm - Regular, rate and rythm. Heart Sounds -Normal heart sounds.  Abdomen Inspection:-Inspection Normal.  Palpation/Perucssion: Palpation and Percussion of the abdomen reveal- Non Tender, No Rebound tenderness, No rigidity(Guarding) and No Palpable abdominal masses.  Liver:-Normal.  Spleen:- Normal.   Back Rt side para lumbar spine tenderness to palpation. Pain on straight leg lift. Pain on lateral movements and flexion/extension of the spine.  Lower ext neurologic  L5-S1 sensation intact bilaterally. Normal patellar reflexes bilaterally. No foot drop bilaterally.       Assessment & Plan:   Assessment and Plan    Acute Right Lower Back Pain Sharp, acute pain in the right lower back, different from the chronic dull pain experienced for years. No radiating pain or numbness in the legs. Pain exacerbated by certain movements and positions. Similar episode  occurred in 2022. -Administer Toradol 30mg  injection today. -Prescribe Meloxicam 15mg  daily starting tomorrow. -Prescribe Zanaflex for muscle relaxation. -Advise back stretching exercises as tolerated, starting in a day or two. -Order lumbar x-ray if pain persists into next week. Get study done. -Schedule follow-up in 10 days or sooner if pain worsens or changes.        Esperanza Richters, PA-C

## 2023-03-09 NOTE — Patient Instructions (Addendum)
Acute Right Lower Back Pain Sharp, acute pain in the right lower back, different from the chronic dull pain experienced for years. No radiating pain or numbness in the legs. Pain exacerbated by certain movements and positions. Similar episode occurred in 2022. -Administer Toradol 30mg  injection today. -Prescribe Meloxicam 15mg  daily starting tomorrow. -Prescribe Zanaflex for muscle relaxation. -Advise back stretching exercises as tolerated, starting in a day or two. -Order lumbar x-ray if pain persists into next week. Get study done. -Schedule follow-up in 10 days or sooner if pain worsens or changes.  Back Exercises These exercises help to make your trunk and back strong. They also help to keep the lower back flexible. Doing these exercises can help to prevent or lessen pain in your lower back. If you have back pain, try to do these exercises 2-3 times each day or as told by your doctor. As you get better, do the exercises once each day. Repeat the exercises more often as told by your doctor. To stop back pain from coming back, do the exercises once each day, or as told by your doctor. Do exercises exactly as told by your doctor. Stop right away if you feel sudden pain or your pain gets worse. Exercises Single knee to chest Do these steps 3-5 times in a row for each leg: Lie on your back on a firm bed or the floor with your legs stretched out. Bring one knee to your chest. Grab your knee or thigh with both hands and hold it in place. Pull on your knee until you feel a gentle stretch in your lower back or butt. Keep doing the stretch for 10-30 seconds. Slowly let go of your leg and straighten it. Pelvic tilt Do these steps 5-10 times in a row: Lie on your back on a firm bed or the floor with your legs stretched out. Bend your knees so they point up to the ceiling. Your feet should be flat on the floor. Tighten your lower belly (abdomen) muscles to press your lower back against the floor.  This will make your tailbone point up to the ceiling instead of pointing down to your feet or the floor. Stay in this position for 5-10 seconds while you gently tighten your muscles and breathe evenly. Cat-cow Do these steps until your lower back bends more easily: Get on your hands and knees on a firm bed or the floor. Keep your hands under your shoulders, and keep your knees under your hips. You may put padding under your knees. Let your head hang down toward your chest. Tighten (contract) the muscles in your belly. Point your tailbone toward the floor so your lower back becomes rounded like the back of a cat. Stay in this position for 5 seconds. Slowly lift your head. Let the muscles of your belly relax. Point your tailbone up toward the ceiling so your back forms a sagging arch like the back of a cow. Stay in this position for 5 seconds.  Press-ups Do these steps 5-10 times in a row: Lie on your belly (face-down) on a firm bed or the floor. Place your hands near your head, about shoulder-width apart. While you keep your back relaxed and keep your hips on the floor, slowly straighten your arms to raise the top half of your body and lift your shoulders. Do not use your back muscles. You may change where you place your hands to make yourself more comfortable. Stay in this position for 5 seconds. Keep your back relaxed.  Slowly return to lying flat on the floor.  Bridges Do these steps 10 times in a row: Lie on your back on a firm bed or the floor. Bend your knees so they point up to the ceiling. Your feet should be flat on the floor. Your arms should be flat at your sides, next to your body. Tighten your butt muscles and lift your butt off the floor until your waist is almost as high as your knees. If you do not feel the muscles working in your butt and the back of your thighs, slide your feet 1-2 inches (2.5-5 cm) farther away from your butt. Stay in this position for 3-5 seconds. Slowly  lower your butt to the floor, and let your butt muscles relax. If this exercise is too easy, try doing it with your arms crossed over your chest. Belly crunches Do these steps 5-10 times in a row: Lie on your back on a firm bed or the floor with your legs stretched out. Bend your knees so they point up to the ceiling. Your feet should be flat on the floor. Cross your arms over your chest. Tip your chin a little bit toward your chest, but do not bend your neck. Tighten your belly muscles and slowly raise your chest just enough to lift your shoulder blades a tiny bit off the floor. Avoid raising your body higher than that because it can put too much stress on your lower back. Slowly lower your chest and your head to the floor. Back lifts Do these steps 5-10 times in a row: Lie on your belly (face-down) with your arms at your sides, and rest your forehead on the floor. Tighten the muscles in your legs and your butt. Slowly lift your chest off the floor while you keep your hips on the floor. Keep the back of your head in line with the curve in your back. Look at the floor while you do this. Stay in this position for 3-5 seconds. Slowly lower your chest and your face to the floor. Contact a doctor if: Your back pain gets a lot worse when you do an exercise. Your back pain does not get better within 2 hours after you exercise. If you have any of these problems, stop doing the exercises. Do not do them again unless your doctor says it is okay. Get help right away if: You have sudden, very bad back pain. If this happens, stop doing the exercises. Do not do them again unless your doctor says it is okay. This information is not intended to replace advice given to you by your health care provider. Make sure you discuss any questions you have with your health care provider. Document Revised: 06/02/2020 Document Reviewed: 06/02/2020 Elsevier Patient Education  2024 [employer].

## 2023-03-12 ENCOUNTER — Ambulatory Visit (INDEPENDENT_AMBULATORY_CARE_PROVIDER_SITE_OTHER): Payer: 59 | Admitting: Medical

## 2023-03-12 ENCOUNTER — Other Ambulatory Visit: Payer: 59

## 2023-03-12 ENCOUNTER — Ambulatory Visit (HOSPITAL_BASED_OUTPATIENT_CLINIC_OR_DEPARTMENT_OTHER)
Admission: RE | Admit: 2023-03-12 | Discharge: 2023-03-12 | Disposition: A | Discharge status: Home / Self Care | Payer: 59 | Source: Ambulatory Visit | Attending: Medical | Admitting: Medical

## 2023-03-12 ENCOUNTER — Telehealth: Payer: Self-pay | Admitting: Internal Medicine

## 2023-03-12 DIAGNOSIS — M545 Low back pain, unspecified: Secondary | ICD-10-CM | POA: Insufficient documentation

## 2023-03-12 DIAGNOSIS — N200 Calculus of kidney: Secondary | ICD-10-CM | POA: Diagnosis not present

## 2023-03-12 MED ORDER — HYDROCODONE-ACETAMINOPHEN 5-325 MG PO TABS: 1.0000 | ORAL_TABLET | Freq: Four times a day (QID) | ORAL | 0 refills | Status: DC | PRN

## 2023-03-12 NOTE — Addendum Note (Signed)
Addended by: Gwenevere Abbot on: 03/12/2023 01:50 PM   Modules accepted: Orders

## 2023-03-12 NOTE — Progress Notes (Signed)
   Subjective:    Patient ID: Dennis Sellers., male    DOB: Apr 20, 1960, 62 y.o.   MRN: [id number]  HPI Discussed the use of AI scribe software for clinical note transcription with the patient, who gave verbal consent to proceed.  See last visit A/P. Notified pt on ls xray earlier today. In for follow up.  History of Present Illness   The patient, with a history of degenerative changes in the spine and a 4mm kidney stone, presents with persistent right-sided back pain. The pain, described as sharp, was at its worst the previous night but has since decreased in severity. The patient notes that the pain is more pronounced with movement and is almost non-existent at rest, suggesting a muscular component.  The patient has been on Toradol 30mg  since the 6th and started Meloxicam the following day, along with a muscle relaxant. The pain has been moderately present until this morning when it decreased. The patient also reports a good night's sleep.  The patient is aware of the kidney stone and is concerned about potential movement and associated pain.  The patient has tried some stretching exercises, such as the cat-cow pose, but found it increased the pain. However, hamstring stretches were performed without much discomfort. The patient has also been using a heating pad for relief.        Review of Systems See hpi    Objective:   Physical Exam  general Appearance- Not in acute distress.     Chest and Lung Exam Auscultation: Breath sounds:-Normal. Clear even and unlabored. Adventitious sounds:- No Adventitious sounds.   Cardiovascular Auscultation:Rythm - Regular, rate and rythm. Heart Sounds -Normal heart sounds.   Abdomen Inspection:-Inspection Normal.  Palpation/Perucssion: Palpation and Percussion of the abdomen reveal- Non Tender, No Rebound tenderness, No rigidity(Guarding) and No Palpable abdominal masses.  Liver:-Normal.  Spleen:- Normal.    Back Rt side para lumbar  spine tenderness to palpation. Pain on straight leg lift. Pain on lateral movements and flexion/extension of the spine.   Lower ext neurologic   L5-S1 sensation intact bilaterally. Normal patellar reflexes bilaterally. No foot drop bilaterally.       Assessment & Plan:   Assessment and Plan    Right-sided Back Pain Pain is sharp and worsens with movement, suggesting a musculoskeletal origin. Pain has improved with Meloxicam and Toradol. X-rays show degenerative changes in the spine. -Continue Meloxicam. -Implement heat pad and stretching exercises as tolerated. -Provide an update in three days. -Consider referral to sports medicine if pain persists.  Kidney Stone 4mm stone present, but no current symptoms suggestive of stone movement. Pain is primarily musculoskeletal and improves with rest. -Prescribe Norco for potential severe pain if stone begins to move. -If pain changes to constant, severe, or flank-like, order CT abdomen and pelvis and consider referral to Urology.   Follow up date to be determined after update in 3 days or sooner if needed

## 2023-03-12 NOTE — Patient Instructions (Signed)
Right-sided Back Pain Pain is sharp and worsens with movement, suggesting a musculoskeletal origin. Pain has improved with Meloxicam and Toradol. X-rays show degenerative changes in the spine. -Continue Meloxicam. -Implement heat pad and stretching exercises as tolerated. -Provide an update in three days. -Consider referral to sports medicine if pain persists.  Kidney Stone 4mm stone present, but no current symptoms suggestive of stone movement. Pain is primarily musculoskeletal and improves with rest. -Prescribe Norco for potential severe pain if stone begins to move. -If pain changes to constant, severe, or flank-like, order CT abdomen and pelvis and consider referral to Urology.   Follow up date to be determined after update in 3 days or sooner if needed

## 2023-03-12 NOTE — Telephone Encounter (Signed)
Patient called to return Edwards call to discuss imaging results.   Additionally, he will be coming to our lab this afternoon to complete orders.

## 2023-03-13 NOTE — Telephone Encounter (Signed)
Vitals weren't taken when placed in room due to it being a visit to discuss labs. pt had an accidental lab visit scheduled , pt had to be switched over to an OV since he was already in office . Forgot to delegate task before leaving for the day due to rooming another patient prior leaving early.

## 2023-03-21 ENCOUNTER — Ambulatory Visit: Payer: 59 | Attending: Cardiovascular Disease | Admitting: Cardiovascular Disease

## 2023-03-21 ENCOUNTER — Encounter: Payer: Self-pay | Admitting: Cardiovascular Disease

## 2023-03-21 VITALS — BP 116/68 | HR 61 | Ht 77.0 in | Wt 309.0 lb

## 2023-03-21 DIAGNOSIS — I7121 Aneurysm of the ascending aorta, without rupture: Secondary | ICD-10-CM

## 2023-03-21 DIAGNOSIS — I2510 Atherosclerotic heart disease of native coronary artery without angina pectoris: Secondary | ICD-10-CM

## 2023-03-21 DIAGNOSIS — R0602 Shortness of breath: Secondary | ICD-10-CM | POA: Diagnosis not present

## 2023-03-21 DIAGNOSIS — E782 Mixed hyperlipidemia: Secondary | ICD-10-CM | POA: Diagnosis not present

## 2023-03-21 NOTE — Assessment & Plan Note (Signed)
History of CAD with a coronary calcium score of 212 performed 08/04/21 with subsequent FFR analysis showing no physiologic significance.  He is totally asymptomatic and is fairly active.

## 2023-03-21 NOTE — Progress Notes (Signed)
03/21/2023 Dennis Castro.   11/22/1960  [id number]  Primary Physician Wanda Plump, MD Primary Cardiologist: Runell Gess MD Milagros Loll, [place], [place]  HPI:  Dennis Castro. is a 62 y.o.   severely overweight married Caucasian male father of one  Who works at [employer] in [place]. I last saw him in the office 01/04/22.  He has seen Robin Searing, PA-C since that time.[name] He was having atypical chest pain and had a Myoview stress test that showed subtle anterior ischemia. Based on this he underwent cardiac catheterization by myself 06/24/09 revealing noncritical CAD with at most a 30% hypodense segmental mid LAD stent stenosis with very subtle anteroapical hypokinesia. Medical therapy is recommended. He continued to have atypical chest pain. He does have a family history of heart disease with a mother who had stents in her 60s and bypass surgery at 26.   He was seen in the ER 07/05/2017 with atypical chest pain.  His EKG showed no acute changes and he ruled out by enzymes.  He had no recurrent symptoms.   He had gone on a diet and exercise campaign and is lost 45 pounds down from 335--->290 #.  He felt clinically improved.  Unfortunately, he has gained all the weight back for various reasons.   He had a hernia repair and a gallbladder removed.  There are no symptoms compatible with obstructive sleep apnea.   He had a 2D echocardiogram performed 01/03/2022 that was essentially normal except for an ascending thoracic aorta measuring 43 mm which we will need to follow on annual basis.  A coronary calcium score performed 08/04/21 was 212 the majority of which was in the LAD territory.  Subsequent FFR analysis did not show physiologic significance.   Since I saw him a year ago he did receive the COVID-vaccine approxi-2 months ago and 2 weeks later developed Bell's palsy on the left side.  He has been on prednisone and gained 20 pounds as a result.  He exercises at the gym 4  days/week lifting weights and doing treadmill.  [facility] and had loss of significant Mehta weight which she has gained some back as result of the prednisone.  He denies chest pain or shortness of breath.   Current Meds  Medication Sig   ALPHA LIPOIC ACID PO Take 600 mg by mouth daily.   aspirin EC 81 MG tablet Take 81 mg by mouth daily.   atorvastatin (LIPITOR) 20 MG tablet TAKE ONE TABLET BY MOUTH EVERY NIGHT AT BEDTIME   Cholecalciferol (VITAMIN D3) 1.25 MG (50000 UT) CAPS Take one po every 14 days   colestipol (COLESTID) 1 g tablet Take 1 tablet (1 g total) by mouth 2 (two) times daily as needed. Please schedule a yearly follow up for further refills. Thank you   erythromycin ophthalmic ointment Place a 1/2 inch ribbon of ointment into the lower eyelid.   HYDROcodone-acetaminophen (NORCO) 5-325 MG tablet Take 1 tablet by mouth every 6 (six) hours as needed for moderate pain (pain score 4-6) or severe pain (pain score 7-10).   meloxicam (MOBIC) 15 MG tablet Take 1 tablet (15 mg total) by mouth daily.   methylcellulose (CITRUCEL) oral powder If needed   metoprolol tartrate (LOPRESSOR) 25 MG tablet TAKE 0.5 TABLET (12.5 MG TOTAL) BY MOUTHTWICE DAILY   Multiple Vitamin (MULTIVITAMIN WITH MINERALS) TABS tablet Take 1 tablet by mouth daily.   Omega-3 Fatty Acids (FISH  OIL) 1000 MG CAPS Take 2 capsules (2,000 mg total) by mouth daily.   sildenafil (REVATIO) 20 MG tablet Take 3-4 tablets (60-80 mg total) by mouth at bedtime as needed.   tiZANidine (ZANAFLEX) 4 MG tablet Take 1 tablet (4 mg total) by mouth every 8 (eight) hours as needed for muscle spasms.     No Known Allergies  Social History   Socioeconomic History   Marital status: Married    Spouse name: [name]   Number of children: 1   Years of education: Not on file   Highest education level: Not on file  Occupational History   OccupationHydrologist, office job  Tobacco Use   Smoking status: Never    Smokeless tobacco: Never  Vaping Use   Vaping status: Never Used  Substance and Sexual Activity   Alcohol use: No   Drug use: No   Sexual activity: Yes  Other Topics Concern   Not on file  Social History Narrative   Teenage son [name], still at home   Social Drivers of Health   Financial Resource Strain: Not on file  Food Insecurity: Not on file  Transportation Needs: Not on file  Physical Activity: Not on file  Stress: Not on file  Social Connections: Not on file  Intimate Partner Violence: Not on file     Review of Systems: General: negative for chills, fever, night sweats or weight changes.  Cardiovascular: negative for chest pain, dyspnea on exertion, edema, orthopnea, palpitations, paroxysmal nocturnal dyspnea or shortness of breath Dermatological: negative for rash Respiratory: negative for cough or wheezing Urologic: negative for hematuria Abdominal: negative for nausea, vomiting, diarrhea, bright red blood per rectum, melena, or hematemesis Neurologic: negative for visual changes, syncope, or dizziness All other systems reviewed and are otherwise negative except as noted above.    Blood pressure 116/68, pulse 61, height 6\' 5"  (1.956 m), weight (!) 309 lb (140.2 kg).  General appearance: alert and no distress Neck: no adenopathy, no carotid bruit, no JVD, supple, symmetrical, trachea midline, and thyroid not enlarged, symmetric, no tenderness/mass/nodules Lungs: clear to auscultation bilaterally Heart: regular rate and rhythm, S1, S2 normal, no murmur, click, rub or gallop Extremities: extremities normal, atraumatic, no cyanosis or edema Pulses: 2+ and symmetric Skin: Skin color, texture, turgor normal. No rashes or lesions Neurologic: Grossly normal  EKG EKG Interpretation Date/Time:  Wednesday March 21 2023 09:49:27 EST Ventricular Rate:  61 PR Interval:  180 QRS Duration:  108 QT Interval:  398 QTC Calculation: 400 R Axis:   56  Text  Interpretation: Normal sinus rhythm Normal ECG When compared with ECG of 24-Apr-2020 22:05, PREVIOUS ECG IS PRESENT Confirmed by Nanetta Batty [phone]) on 03/21/2023 10:08:19 AM    ASSESSMENT AND PLAN:   Hyperlipidemia History of hyperlipidemia on statin therapy with lipid profile performed 09/21/2022 revealing total cholesterol 95, LDL 39 and HDL 33.  Morbid obesity (HCC) History of morbid [facility].  He had enjoyed estimated weight loss which has somewhat reversed since being on prednisone.  He is aware of diet and exercise.  Coronary artery disease History of CAD with a coronary calcium score of 212 performed 08/04/21 with subsequent FFR analysis showing no physiologic significance.  He is totally asymptomatic and is fairly active.  Thoracic aortic aneurysm [facility]) History of small thoracic aortic aneurysm measuring 44 mm by 2D echo 01/03/2023.  This will be repeated on an annual basis.     Runell Gess MD FACP,FACC,FAHA,  FSCAI 03/21/2023 10:17 AM

## 2023-03-21 NOTE — Assessment & Plan Note (Signed)
History of hyperlipidemia on statin therapy with lipid profile performed 09/21/2022 revealing total cholesterol 95, LDL 39 and HDL 33.

## 2023-03-21 NOTE — Assessment & Plan Note (Signed)
History of small thoracic aortic aneurysm measuring 44 mm by 2D echo 01/03/2023.  This will be repeated on an annual basis.

## 2023-03-21 NOTE — Patient Instructions (Signed)
Medication Instructions:  Your physician recommends that you continue on your current medications as directed. Please refer to the Current Medication list given to you today.  *If you need a refill on your cardiac medications before your next appointment, please call your pharmacy*   Testing/Procedures: Your physician has requested that you have an echocardiogram. Echocardiography is a painless test that uses sound waves to create images of your heart. It provides your doctor with information about the size and shape of your heart and how well your heart's chambers and valves are working. This procedure takes approximately one hour. There are no restrictions for this procedure. Please do NOT wear cologne, perfume, aftershave, or lotions (deodorant is allowed). Please arrive 15 minutes prior to your appointment time. **To do in October 2025**  Please note: We ask at that you not bring children with you during ultrasound (echo/ vascular) testing. Due to room size and safety concerns, children are not allowed in the ultrasound rooms during exams. Our front office staff cannot provide observation of children in our lobby area while testing is being conducted. An adult accompanying a patient to their appointment will only be allowed in the ultrasound room at the discretion of the ultrasound technician under special circumstances. We apologize for any inconvenience.    Follow-Up: At [facility], you and your health needs are our priority.  As part of our continuing mission to provide you with exceptional heart care, we have created designated Provider Care Teams.  These Care Teams include your primary Cardiologist (physician) and Advanced Practice Providers (APPs -  Physician Assistants and Nurse Practitioners) who all work together to provide you with the care you need, when you need it.  We recommend signing up for the patient portal called "MyChart".  Sign up information is provided on this  After Visit Summary.  MyChart is used to connect with patients for Virtual Visits (Telemedicine).  Patients are able to view lab/test results, encounter notes, upcoming appointments, etc.  Non-urgent messages can be sent to your provider as well.   To learn more about what you can do with MyChart, go to [email or web address].    Your next appointment:   12 month(s)  Provider:   Nanetta Batty, MD

## 2023-03-21 NOTE — Assessment & Plan Note (Signed)
History of morbid [facility].  He had enjoyed estimated weight loss which has somewhat reversed since being on prednisone.  He is aware of diet and exercise.

## 2023-04-11 ENCOUNTER — Ambulatory Visit: Payer: 59 | Admitting: Bariatrics

## 2023-04-13 ENCOUNTER — Ambulatory Visit: Payer: 59 | Admitting: Gastroenterology

## 2023-04-25 ENCOUNTER — Encounter: Payer: Self-pay | Admitting: Bariatrics

## 2023-04-25 ENCOUNTER — Ambulatory Visit: Payer: 59 | Admitting: Bariatrics

## 2023-04-25 VITALS — BP 111/73 | HR 72 | Temp 98.5°F | Ht 77.0 in | Wt 305.0 lb

## 2023-04-25 DIAGNOSIS — E785 Hyperlipidemia, unspecified: Secondary | ICD-10-CM | POA: Diagnosis not present

## 2023-04-25 DIAGNOSIS — E669 Obesity, unspecified: Secondary | ICD-10-CM | POA: Diagnosis not present

## 2023-04-25 DIAGNOSIS — Z6836 Body mass index (BMI) 36.0-36.9, adult: Secondary | ICD-10-CM | POA: Diagnosis not present

## 2023-04-25 DIAGNOSIS — R0602 Shortness of breath: Secondary | ICD-10-CM

## 2023-04-25 DIAGNOSIS — R5383 Other fatigue: Secondary | ICD-10-CM | POA: Diagnosis not present

## 2023-04-25 DIAGNOSIS — E782 Mixed hyperlipidemia: Secondary | ICD-10-CM

## 2023-04-25 NOTE — Progress Notes (Signed)
WEIGHT SUMMARY AND BIOMETRICS  Weight Lost Since Last Visit: 0lb  Weight Gained Since Last Visit: 4lb   Vitals Temp: 98.5 F (36.9 C) BP: 111/73 Pulse Rate: 72 SpO2: 96 %   Anthropometric Measurements Height: 6\' 5"  (1.956 m) Weight: (!) 305 lb (138.3 kg) BMI (Calculated): 36.16 Weight at Last Visit: 301lb Weight Lost Since Last Visit: 0lb Weight Gained Since Last Visit: 4lb Starting Weight: 327lb Total Weight Loss (lbs): 24 lb (10.9 kg)   Body Composition  Body Fat %: 35 % Fat Mass (lbs): 107 lbs Muscle Mass (lbs): 189 lbs Total Body Water (lbs): 137 lbs Visceral Fat Rating : 21   Other Clinical Data RMR: 2520 Fasting: Yes Labs: Yes Today's Visit #: 15 Starting Date: 10/26/21    OBESITY Dennis Castro is here to discuss his progress with his obesity treatment plan along with follow-up of his obesity related diagnoses.    Nutrition Plan: the Category 4 plan - 25% adherence.  Current exercise: none  Interim History:  He is up 4 pounds since his last visit.  He has been more stressed than usual as he has Bell's palsy and has had it for almost 3 months and is scheduled to see a neurologist in the near future. He has been exercising the last secondary to the Bell's palsy. Not eating all of the food on the plan., Protein intake is as prescribed, Is not skipping meals, Water intake is inadequate., and Denies polyphagia  Hunger is moderately controlled.  Cravings are moderately controlled.  Assessment/Plan:   1. SOB (shortness of breath) Fatigue and SOB  Patient states that he is fatigued with certain activities.   Resting Metabolic Rate ( RMR). RMR ( actual):2,520 kcal RMR ( expected): 2,767 kcal RMR (previous) 2,650 RMR actual < than expected RMR.     Plan:  Read and interpreted the RMR readings to the patient. Patient voiced understanding.   Discussed the implications for the chosen plan and exercise based on the RMR reading.  Will consider repeating the RMR in the future based on weight loss.   Will start to journal and begin calories 1,800 to 2,000 and 120 to 140 grams of protein.  Will resume some mild exercise.   Hyperlipidemia LDL is at goal. Medication(s): Lipitor Cardiovascular risk factors: advanced age (older than 89 for men, 77 for women), dyslipidemia, male gender, obesity (BMI >= 30 kg/m2), and sedentary lifestyle  Lab Results  Component Value Date   CHOL 95 09/25/2022   HDL 33.20 (L) 09/25/2022   LDLCALC 39 09/25/2022   TRIG 118.0 09/25/2022   CHOLHDL 3 09/25/2022   Lab Results  Component Value Date   ALT 24 09/25/2022   AST 16 09/25/2022   ALKPHOS 73 09/25/2022   BILITOT 0.5 09/25/2022   The ASCVD Risk score (Arnett DK, et al., 2019) failed to calculate for the following reasons:  The valid total cholesterol range is 130 to 320 mg/dL  Plan:  Continue statin.  Information sheet on healthy vs unhealthy fats.  Will avoid all trans fats.  Will read labels Will minimize saturated fats except the following: low fat meats in moderation, diary, and limited dark chocolate.  Will go back to journaling his food on a regular basis.  He will try to get 7 to 9 hours of sleep nightly.     Generalized Obesity: Current BMI BMI (Calculated): 36.16    Romani is currently in the action stage of change. As such, his goal is to continue with weight loss efforts.  He has agreed to keeping a food journal with goal of 1,800 to 2,000 calories and 120 to 140 grams of protein daily.  Exercise goals: All adults should avoid inactivity. Some physical activity is better than none, and adults who participate in any amount of physical activity gain some health benefits.  Behavioral modification strategies: increasing lean protein intake, decreasing simple carbohydrates , no meal skipping, decrease eating out, meal planning  , better snacking choices, planning for success, increasing fiber rich foods, decreasing sodium intake, avoiding temptations, and mindful eating.  Josiahs has agreed to follow-up with our clinic in 4 weeks.     Objective:   VITALS: Per patient if applicable, see vitals. GENERAL: Alert and in no acute distress. CARDIOPULMONARY: No increased WOB. Speaking in clear sentences.  PSYCH: Pleasant and cooperative. Speech normal rate and rhythm. Affect is appropriate. Insight and judgement are appropriate. Attention is focused, linear, and appropriate.  NEURO: Oriented as arrived to appointment on time with no prompting.   Attestation Statements:   This was prepared with the assistance of Engineer, civil (consulting).  Occasional wrong-word or sound-a-like substitutions may have occurred due to the inherent limitations of voice recognition

## 2023-05-21 ENCOUNTER — Ambulatory Visit: Payer: 59 | Admitting: Gastroenterology

## 2023-05-21 ENCOUNTER — Other Ambulatory Visit: Payer: Self-pay | Admitting: Gastroenterology

## 2023-05-21 ENCOUNTER — Telehealth: Payer: Self-pay

## 2023-05-21 ENCOUNTER — Other Ambulatory Visit: Payer: Self-pay | Admitting: Cardiovascular Disease

## 2023-05-21 NOTE — Telephone Encounter (Signed)
 Received fax from Dr. Maisie Fus at [facility]. They are requesting blood work to be drawn here. Form faxed back to them at [phone] informing that we only draw blood work for our primary care group here. Pt will need to go to a [facility] or [facility] draw location.

## 2023-05-23 ENCOUNTER — Ambulatory Visit: Payer: 59 | Admitting: Bariatrics

## 2023-05-24 ENCOUNTER — Telehealth: Payer: Self-pay | Admitting: Gastroenterology

## 2023-05-24 MED ORDER — COLESTIPOL HCL 1 G PO TABS: 1.0000 g | ORAL_TABLET | Freq: Two times a day (BID) | ORAL | 0 refills | Status: DC | PRN

## 2023-05-24 NOTE — Telephone Encounter (Signed)
 Inbound call from patient, requesting a refill for colestipol, patient is scheduled for 3/7 at 3:00 PM.

## 2023-05-24 NOTE — Telephone Encounter (Signed)
 Sent 30 day refill to get to next month's appointment.  Patient needs to keep appointment for further refills.

## 2023-05-28 ENCOUNTER — Ambulatory Visit: Payer: 59 | Admitting: Internal Medicine

## 2023-05-28 VITALS — BP 121/77 | HR 60 | Temp 98.8°F | Resp 16 | Ht 77.0 in | Wt 312.0 lb

## 2023-05-28 DIAGNOSIS — E66812 Obesity, class 2: Secondary | ICD-10-CM

## 2023-05-28 DIAGNOSIS — I7121 Aneurysm of the ascending aorta, without rupture: Secondary | ICD-10-CM | POA: Diagnosis not present

## 2023-05-28 DIAGNOSIS — G510 Bell's palsy: Secondary | ICD-10-CM

## 2023-05-28 DIAGNOSIS — Z6838 Body mass index (BMI) 38.0-38.9, adult: Secondary | ICD-10-CM

## 2023-05-28 NOTE — Patient Instructions (Addendum)
   Next visit with me  by 09/2023, physical exam   Please schedule it at the front desk   Will arrange a referral to neurology ref: Bell's Palsy

## 2023-05-28 NOTE — Progress Notes (Signed)
 Subjective:    Patient ID: Dennis Castro., male    DOB: [birth year], 63 y.o.   MRN: [id number]  DOS:  05/28/2023 Type of visit - description: Follow-up  Here for a follow-up. Was diagnosed with Bell's palsy October 2024. Saw ophthalmology multiple times, as he was not getting better, they ordered a brain MRI, it showed a micropituitary adenoma.  Saw neurosurgery, no further eval indicated. Chronic medical problems addressed.  Good med compliance.  Wt Readings from Last 3 Encounters:  05/28/23 (!) 312 lb (141.5 kg)  04/25/23 (!) 305 lb (138.3 kg)  03/21/23 (!) 309 lb (140.2 kg)      Review of Systems See above   Past Medical History:  Diagnosis Date   Anxiety    Arthritis    Atypical chest pain    Back pain    Cholelithiasis 09/2015   determined by CT, asymptomatic   Constipation    Coronary artery disease    Diverticulitis    Family history of heart disease    GERD (gastroesophageal reflux disease)    diet related   Hyperlipidemia    IBS (irritable bowel syndrome)    Joint pain    Seasonal allergies    Swelling of lower extremity     Past Surgical History:  Procedure Laterality Date   CARDIAC CATHETERIZATION  04/03/2008   Dr. Allyson Sabal; less than 30% occlusive disease   CHOLECYSTECTOMY N/A 06/14/2018   Procedure: LAPAROSCOPIC CHOLECYSTECTOMY;  Surgeon: Axel Filler, MD;  Location: [facility] OR;  Service: General;  Laterality: N/A;   CT CTA CORONARY W/CA SCORE W/CM &/OR WO/CM  08/04/2021   CT FFR analysis showed flow limitations in the  distal tip of the LAD.Coronary calcium score of 212   INSERTION OF MESH  12/13/2018   Procedure: Insertion Of Mesh;  Surgeon: Axel Filler, MD;  Location: [facility] OR;  Service: General;;   TONSILLECTOMY     age 23   TRANSTHORACIC ECHOCARDIOGRAM  08/02/2021   EF 55-60%,Grade I DD,mild LVH,mild aortic dialation of 43mm   UMBILICAL HERNIA REPAIR N/A 12/13/2018   Procedure: LAPAROSCOPIC UMBILICAL HERNIA REPAIR;  Surgeon: Axel Filler, MD;  Location: [facility] OR;  Service: General;  Laterality: N/A;    Current Outpatient Medications  Medication Instructions   ALPHA LIPOIC ACID PO 600 mg, Daily   aspirin EC 81 mg, Daily   atorvastatin (LIPITOR) 20 mg, Oral, Daily at bedtime   Cholecalciferol (VITAMIN D3) 1.25 MG (50000 UT) CAPS Take one po every 14 days   colestipol (COLESTID) 1 g, Oral, 2 times daily PRN, Please keep your March 7 appointment for further refills. Thank you   erythromycin ophthalmic ointment Place a 1/2 inch ribbon of ointment into the lower eyelid.   Fish Oil 2,000 mg, Oral, Daily   meloxicam (MOBIC) 15 mg, Oral, Daily   methylcellulose (CITRUCEL) oral powder If needed   metoprolol tartrate (LOPRESSOR) 25 MG tablet TAKE 0.5 TABLET (12.5 MG TOTAL) BY MOUTH TWICE DAILY   Multiple Vitamin (MULTIVITAMIN WITH MINERALS) TABS tablet 1 tablet, Daily   sildenafil (REVATIO) 60-80 mg, Oral, At bedtime PRN   tiZANidine (ZANAFLEX) 4 mg, Oral, Every 8 hours PRN       Objective:   Physical Exam BP 121/77 (BP Location: Right Arm, Patient Position: Sitting, Cuff Size: Large)   Pulse 60   Temp 98.8 F (37.1 C) (Oral)   Resp 16   Ht 6\' 5"  (1.956 m)   Wt (!) 312 lb (141.5 kg)   SpO2  97%   BMI 37.00 kg/m  General:   Well developed, NAD, BMI noted. HEENT:  Normocephalic . Face symmetric, atraumatic Lungs:  CTA B Normal respiratory effort, no intercostal retractions, no accessory muscle use. Heart: RRR,  no murmur.  Lower extremities: no pretibial edema bilaterally  Skin: Not pale. Not jaundice Neurologic:  alert & oriented X3.  Left facial paresis seems unchanged compared to few months ago.   Psych--  Cognition and judgment appear intact.  Cooperative with normal attention span and concentration.  Behavior appropriate. No anxious or depressed appearing.      Assessment     Assessment   Hyperlipidemia   GERD Obesity , Morbid  CAD:  --Chest pain: Myoview stress test subtle anterior  ischemia --Cardiac cath 06-24-09: Noncritical 30% LAD with subtle anteroapical HK. -- CTA 08/2021: Calcium score 212, see full report. --Thoracic aortic aneurysm Diverticulitis Seasonal allergies +FH prostate ca (father age 47) +FH CAD (mother age 33) kidney  Stones Neuropathy (Dx at neurology 06-2021) MUTYH  heterozygous mutation (per GI note reviewed)   PLAN: Bell's palsy: Started October 2024, saw ophthalmology, as he was not getting better a brain MRI was done, + for a pituitary adenoma, saw neurosurgery Dr. Maisie Fus, no further eval needed. He is somewhat frustrated about the issue, at the end we agreed on getting neurology's opinion.  Referral placed. Pituitary adenoma per brain MRI 04/11/2023.  Saw neurosurgery, no further eval. CAD, thoracic aortic aneurysm: Cardiology visit December 2024, no angina, next echo 01-2024. Morbid obesity: not mking much progress, under the care of the wellness clinic, encouraged to go back to exercise regularly which he has not done. RTC 09/2023 CPX

## 2023-05-28 NOTE — Assessment & Plan Note (Signed)
 Bell's palsy: Started October 2024, saw ophthalmology, as he was not getting better a brain MRI was done, + for a pituitary adenoma, saw neurosurgery Dr. Maisie Fus, no further eval needed. He is somewhat frustrated about the issue, at the end we agreed on getting neurology's opinion.  Referral placed. Pituitary adenoma per brain MRI 04/11/2023.  Saw neurosurgery, no further eval. CAD, thoracic aortic aneurysm: Cardiology visit December 2024, no angina, next echo 01-2024. Morbid obesity: not mking much progress, under the care of the wellness clinic, encouraged to go back to exercise regularly which he has not done. RTC 09/2023 CPX

## 2023-06-04 ENCOUNTER — Ambulatory Visit (INDEPENDENT_AMBULATORY_CARE_PROVIDER_SITE_OTHER): Payer: 59 | Admitting: Bariatrics

## 2023-06-04 ENCOUNTER — Encounter: Payer: Self-pay | Admitting: Bariatrics

## 2023-06-04 VITALS — BP 120/81 | HR 71 | Temp 98.7°F | Ht 77.0 in | Wt 296.0 lb

## 2023-06-04 DIAGNOSIS — E669 Obesity, unspecified: Secondary | ICD-10-CM

## 2023-06-04 DIAGNOSIS — E785 Hyperlipidemia, unspecified: Secondary | ICD-10-CM

## 2023-06-04 DIAGNOSIS — E782 Mixed hyperlipidemia: Secondary | ICD-10-CM

## 2023-06-04 DIAGNOSIS — Z6835 Body mass index (BMI) 35.0-35.9, adult: Secondary | ICD-10-CM

## 2023-06-04 NOTE — Progress Notes (Signed)
 WEIGHT SUMMARY AND BIOMETRICS  Weight Lost Since Last Visit: 9lb  Weight Gained Since Last Visit: 0lb   Vitals Temp: 98.7 F (37.1 C) BP: 120/81 Pulse Rate: 71 SpO2: 97 %   Anthropometric Measurements Height: 6\' 5"  (1.956 m) Weight: 296 lb (134.3 kg) BMI (Calculated): 35.09 Weight at Last Visit: 305lb Weight Lost Since Last Visit: 9lb Weight Gained Since Last Visit: 0lb Starting Weight: 327lb Total Weight Loss (lbs): 31 lb (14.1 kg)   Body Composition  Body Fat %: 33.4 % Fat Mass (lbs): 99.2 lbs Muscle Mass (lbs): 188 lbs Total Body Water (lbs): 131.8 lbs Visceral Fat Rating : 20   Other Clinical Data Fasting: No Labs: No Today's Visit #: 16 Starting Date: 10/26/21    OBESITY Saw is here to discuss his progress with his obesity treatment plan along with follow-up of his obesity related diagnoses.    Nutrition Plan: the Category 4 plan - 50% adherence.  Current exercise: cardiovascular workout on exercise equipment and weightlifting  Interim History:  He is down 9 lbs since his last visit.  Eating all of the food on the plan., Protein intake is as prescribed, Is not skipping meals, and Water intake is adequate. His goal is to get to 250 lbs.   Hunger is moderately controlled.  Cravings are moderately controlled.  Assessment/Plan:   Hyperlipidemia LDL is at goal. Medication(s): Lipitor  Cardiovascular risk factors: advanced age (older than 40 for men, 79 for women), dyslipidemia, male gender, obesity (BMI >= 30 kg/m2), and sedentary lifestyle  Lab Results  Component Value Date   CHOL 95 09/25/2022   HDL 33.20 (L) 09/25/2022   LDLCALC 39 09/25/2022   TRIG 118.0 09/25/2022   CHOLHDL 3 09/25/2022   Lab Results  Component Value Date   ALT 24 09/25/2022   AST 16 09/25/2022   ALKPHOS 73 09/25/2022   BILITOT 0.5 09/25/2022   The  ASCVD Risk score (Arnett DK, et al., 2019) failed to calculate for the following reasons:   The valid total cholesterol range is 130 to 320 mg/dL  Plan:  Continue statin.  Will read labels Will minimize saturated fats except the following: low fat meats in moderation, diary, and limited dark chocolate.  He will continue to eat his meals at home. He will continue to be consistent in his journaling.      Generalized Obesity: Current BMI BMI (Calculated): 35.09   Nimrod is currently in the action stage of change. As such, his goal is to continue with weight loss efforts.  He has agreed to the Category 4 plan.  Exercise goals: For substantial health benefits, adults should do at least 150 minutes (2 hours and 30 minutes) a week of moderate-intensity, or 75 minutes (1 hour and 15 minutes) a week of vigorous-intensity aerobic physical activity, or an equivalent combination of moderate- and vigorous-intensity aerobic activity. Aerobic  activity should be performed in episodes of at least 10 minutes, and preferably, it should be spread throughout the week. He will continue to go to the gym.   Behavioral modification strategies: increasing lean protein intake, better snacking choices, planning for success, increasing vegetables, increasing fiber rich foods, avoiding temptations, and keep healthy foods in the home.  Shaft has agreed to follow-up with our clinic in 4 weeks.       Objective:   VITALS: Per patient if applicable, see vitals. GENERAL: Alert and in no acute distress. CARDIOPULMONARY: No increased WOB. Speaking in clear sentences.  PSYCH: Pleasant and cooperative. Speech normal rate and rhythm. Affect is appropriate. Insight and judgement are appropriate. Attention is focused, linear, and appropriate.  NEURO: Oriented as arrived to appointment on time with no prompting.   Attestation Statements:   This was prepared with the assistance of Engineer, civil (consulting).  Occasional  wrong-word or sound-a-like substitutions may have occurred due to the inherent limitations of voice recognition   Corinna Capra, DO

## 2023-06-05 ENCOUNTER — Ambulatory Visit: Payer: 59 | Admitting: Neurology

## 2023-06-05 ENCOUNTER — Encounter: Payer: Self-pay | Admitting: Neurology

## 2023-06-05 VITALS — BP 128/79 | HR 66 | Ht 77.0 in | Wt 305.0 lb

## 2023-06-05 DIAGNOSIS — G510 Bell's palsy: Secondary | ICD-10-CM

## 2023-06-05 DIAGNOSIS — S0452XA Injury of facial nerve, left side, initial encounter: Secondary | ICD-10-CM | POA: Diagnosis not present

## 2023-06-05 NOTE — Patient Instructions (Addendum)
 Physical Therapy for Bell's Palsy Call [facility] and see if they have PT for bell's palsy Referral to Dr. Ernestine Mcmurray [place], neurosurgeon, peripheral nerve surgeon  Bell's Palsy in Adults: What to Know  Bell's palsy is a condition that causes short-term weakness or paralysis of the muscles in a part of your face. With paralysis, you're not able to move the affected muscles. Bell's palsy happens when a nerve in your face, called the seventh cranial nerve, gets irritated, swollen, or has pressure put on it. This nerve runs along your skull, under your ear, and to the side of your face. It controls movements of your face like blinking, closing your eyes, smiling, and frowning. The symptoms of Bell's palsy may go away on their own in a few weeks. Treatment is sometimes needed. What are the causes? The exact cause of Bell's palsy isn't known. It may be caused by irritation and swelling or an infection from a virus, such as the chickenpox (herpes zoster), Epstein-Barr, or mumps virus. What increases the risk? You're more likely to get Bell's palsy if you: Are pregnant. Have diabetes. Have high blood pressure. Are obese. Have recently had an infection in your nose, throat, or upper airways. Other things that may trigger this condition include: A virus that's been in your body but hasn't been active. A weakened immune system, which is your body's defense system. It may be weak due to: Stress. Not getting enough sleep. Physical trauma or injury. A minor illness. Autoimmune syndromes. Infection or irritation and swelling of the facial nerve. Damage to the covering of the nerve fibers. What are the signs or symptoms? Symptoms of Bell's palsy include: Sudden weakness on one side of the face. Drooping of the eyelid, eyebrow, and corner of the mouth. Drooling from one side of the mouth. Having trouble closing the eyelid. If you're living with Bell's palsy, you may also  develop: Pain or unusual feelings in your face. A lot of tearing in one eye. Changes in how things taste. Being sensitive to sound in one ear. Pain behind one ear. Pain around one side of the jaw. Trouble eating or drinking. Most of the time, only one side of the face is affected. In rare cases, Bell's palsy may affect the whole face. How is this diagnosed? Diagnosis of Bell's palsy will include a physical exam. Your symptoms and medical history will be checked. You may also need to see health care providers who are specialists. This may be an expert in nerve disorders called a neurologist or an eye specialist called an ophthalmologist. You may have tests, such as: An electromyogram. This test checks for nerve damage. Imaging tests, such as a CT scan or an MRI. Blood tests. How is this treated? Bell's palsy affects every person differently. Sometimes symptoms go away without treatment within a few weeks. If treatment is needed, it varies from person to person. The goal of treatment is to reduce irritation and swelling and to protect the eye from damage. Treatment for Bell's palsy may include: Medicines, such as: Steroids to reduce irritation and swelling. Antiviral medicines. Medicines for pain, like aspirin, acetaminophen, or ibuprofen. Eye drops or ointment to keep your eye moist. Eye protection, if you can't close your eyelid. Physical therapy exercises to improve movement and strength in your face muscles. This may also include acupuncture or massage. Follow these instructions at home:  Take your medicines only as told. If your eye is affected: Keep your eye moist with eye drops  or ointment as told by your provider. Follow your provider's instructions for eye care and protection. Do any physical therapy exercises as told. Where to find more information American Academy of Ophthalmology (AAO): SeeTennis.com.ee Contact a health care provider if: You have a fever or chills. Your symptoms  don't get better within 2-3 weeks, or your symptoms get worse. Your eye is red, irritated, or painful. You have new symptoms. You feel light-headed. Get help right away if: You have weakness or numbness in a part of your body other than your face. You have trouble swallowing. You have neck pain or stiffness. You have shortness of breath. These symptoms may be an emergency. Call 911 right away. Do not wait to see if the symptoms will go away. Do not drive yourself to the hospital. This information is not intended to replace advice given to you by your health care provider. Make sure you discuss any questions you have with your health care provider. Document Revised: 08/21/2022 Document Reviewed: 08/21/2022 Elsevier Patient Education  2024 [employer].

## 2023-06-05 NOTE — Progress Notes (Addendum)
 [facility]    Provider:  Dr Lucia Gaskins Requesting Provider: Wanda Plump, MD Primary Care Provider:  Wanda Plump, MD  CC:  Bell's palsy, persistent symptoms   HPI:  Dennis Castro. is a 63 y.o. male here as requested by Wanda Plump, MD for persistent Bell's palsy. has Hyperlipidemia; GERD; Family history of prostate cancer; Annual physical exam; PCP NOTES >>>; Morbid obesity (HCC); Erectile dysfunction; Left ankle pain; Coronary artery disease; Family history of pancreatic cancer; Anxiety; Post-cholecystectomy syndrome; Neuropathy; Other fatigue; Hyperglycemia, unspecified; Vitamin D deficiency; Class 2 severe obesity with serious comorbidity and body mass index (BMI) of 38.0 to 38.9 in adult [facility]); Insulin resistance; Other constipation; Thoracic aortic aneurysm (HCC); Low HDL (under 40); Generalized obesity; BMI 35.0-35.9,adult; Fatigue; and Bell's palsy on their problem list.  Patient reports: 2 days before the Bell's Palsy(October) he had pain in his left ear and felt like it was below the ear. He had been exposed to flu shut I none arm and a covid shot in the other, otherwise no illness. He felt the pain on Tuesday and thought he had to go to the dentist, 2 days later he woke up and looked in the mirror and saw the droop, he knew exactly what is was, he still can;t raise his lip, he still jhas difficulty closing the left eye, he has been to ophthalmology and protects his eye with ointment and drops, he des still have droop pn the left, but now he can move his left cheek. He still feels like he has water in his ear. His tase is impaired on the left. He had difficulty chewing in the beginning now can chew on the left. No other focal neurologic deficits, associated symptoms, inciting events or modifiable factors.    Reviewed notes, labs and imaging from outside physicians, which showed:  I reviewed Dr. Leta Jungling notes from 05/27/2022: diagnosed with bells palsy 01/2023, saw  ophthalmology multiple times, MRI showed a microadenoma, saw neurosurgery no indication for invasive procedure.   MRi brain w/wo, MRI orbit face and/or neck w/wo:  reviewed report:  04/11/2023: 1. Pituitary macroadenoma with bilateral cavernous sinus extension.  The mass contacts the undersurface of the optic chiasm.  2. Asymmetric contrast enhancement of the mastoid segment of the  left facial nerve, consistent with reported Bell's palsy.    Review of Systems: Patient complains of symptoms per HPI as well as the following symptoms neuropathy. Pertinent negatives and positives per HPI. All others negative.   Social History   Socioeconomic History   Marital status: Married    Spouse name: [name]   Number of children: 1   Years of education: Not on file   Highest education level: Not on file  Occupational History   OccupationHydrologist, office job  Tobacco Use   Smoking status: Never   Smokeless tobacco: Never  Vaping Use   Vaping status: Never Used  Substance and Sexual Activity   Alcohol use: No   Drug use: No   Sexual activity: Yes  Other Topics Concern   Not on file  Social History Narrative   Teenage son [name], still at home   Social Drivers of Health   Financial Resource Strain: Not on file  Food Insecurity: Not on file  Transportation Needs: Not on file  Physical Activity: Not on file  Stress: Not on file  Social Connections: Not on file  Intimate Partner Violence: Not on file    Family History  Problem Relation  Age of Onset   Diabetes Mother 78   CAD Mother 79       CBAG   Colon cancer Mother    High blood pressure Mother    Heart disease Mother    Neuropathy Mother    Cancer Father        prostate cancer and panceatic cancer   Prostate cancer Father    Breast cancer Maternal Grandmother    Pancreatic cancer Paternal Grandfather    Stroke Neg Hx    Esophageal cancer Neg Hx    Rectal cancer Neg Hx    Stomach cancer Neg Hx     Past Medical History:   Diagnosis Date   Anxiety    Arthritis    Atypical chest pain    Back pain    Cholelithiasis 09/2015   determined by CT, asymptomatic   Constipation    Coronary artery disease    Diverticulitis    Family history of heart disease    GERD (gastroesophageal reflux disease)    diet related   Hyperlipidemia    IBS (irritable bowel syndrome)    Joint pain    Seasonal allergies    Swelling of lower extremity     Patient Active Problem List   Diagnosis Date Noted   Bell's palsy 01/31/2023   Fatigue 08/03/2022   Generalized obesity 05/29/2022   BMI 35.0-35.9,adult 05/29/2022   Low HDL (under 40) 02/16/2022   Thoracic aortic aneurysm (HCC) 01/04/2022   Other constipation 11/09/2021   Insulin resistance 10/27/2021   Other fatigue 10/26/2021   Hyperglycemia, unspecified 10/26/2021   Vitamin D deficiency 10/26/2021   Class 2 severe obesity with serious comorbidity and body mass index (BMI) of 38.0 to 38.9 in adult [facility]) 10/26/2021   Neuropathy 08/26/2021   Post-cholecystectomy syndrome 04/24/2021   Anxiety 07/10/2019   Family history of pancreatic cancer 01/15/2018   Coronary artery disease 08/10/2017   Left ankle pain 07/20/2017   Erectile dysfunction 03/21/2017   Morbid obesity (HCC) 07/20/2015   Annual physical exam 01/17/2015   PCP NOTES >>> 01/17/2015   Family history of prostate cancer 03/25/2012   GERD 04/01/2010   Hyperlipidemia 10/07/2007    Past Surgical History:  Procedure Laterality Date   CARDIAC CATHETERIZATION  04/03/2008   Dr. Allyson Sabal; less than 30% occlusive disease   CHOLECYSTECTOMY N/A 06/14/2018   Procedure: LAPAROSCOPIC CHOLECYSTECTOMY;  Surgeon: Axel Filler, MD;  Location: [facility] OR;  Service: General;  Laterality: N/A;   CT CTA CORONARY W/CA SCORE W/CM &/OR WO/CM  08/04/2021   CT FFR analysis showed flow limitations in the  distal tip of the LAD.Coronary calcium score of 212   INSERTION OF MESH  12/13/2018   Procedure: Insertion Of Mesh;  Surgeon:  Axel Filler, MD;  Location: [facility] OR;  Service: General;;   TONSILLECTOMY     age 71   TRANSTHORACIC ECHOCARDIOGRAM  08/02/2021   EF 55-60%,Grade I DD,mild LVH,mild aortic dialation of 43mm   UMBILICAL HERNIA REPAIR N/A 12/13/2018   Procedure: LAPAROSCOPIC UMBILICAL HERNIA REPAIR;  Surgeon: Axel Filler, MD;  Location: [facility] OR;  Service: General;  Laterality: N/A;    Current Outpatient Medications  Medication Sig Dispense Refill   ALPHA LIPOIC ACID PO Take 600 mg by mouth daily.     aspirin EC 81 MG tablet Take 81 mg by mouth daily.     atorvastatin (LIPITOR) 20 MG tablet TAKE ONE TABLET BY MOUTH EVERY NIGHT AT BEDTIME 90 tablet 3   Cholecalciferol (VITAMIN D3) 1.25  MG (50000 UT) CAPS Take one po every 14 days 6 capsule 0   colestipol (COLESTID) 1 g tablet Take 1 tablet (1 g total) by mouth 2 (two) times daily as needed. Please keep your March 7 appointment for further refills. Thank you 60 tablet 0   erythromycin ophthalmic ointment Place a 1/2 inch ribbon of ointment into the lower eyelid. 3.5 g 0   meloxicam (MOBIC) 15 MG tablet Take 1 tablet (15 mg total) by mouth daily. 10 tablet 0   methylcellulose (CITRUCEL) oral powder If needed     metoprolol tartrate (LOPRESSOR) 25 MG tablet TAKE 0.5 TABLET (12.5 MG TOTAL) BY MOUTH TWICE DAILY 90 tablet 3   Multiple Vitamin (MULTIVITAMIN WITH MINERALS) TABS tablet Take 1 tablet by mouth daily.     Omega-3 Fatty Acids (FISH OIL) 1000 MG CAPS Take 2 capsules (2,000 mg total) by mouth daily. 60 capsule 12   sildenafil (REVATIO) 20 MG tablet Take 3-4 tablets (60-80 mg total) by mouth at bedtime as needed. 30 tablet 3   tiZANidine (ZANAFLEX) 4 MG tablet Take 1 tablet (4 mg total) by mouth every 8 (eight) hours as needed for muscle spasms. 30 tablet 0   No current facility-administered medications for this visit.    Allergies as of 06/05/2023   (No Known Allergies)    Vitals: BP 128/79 (BP Location: Right Arm, Patient Position: Sitting, Cuff  Size: Normal)   Pulse 66   Ht 6\' 5"  (1.956 m)   Wt (!) 305 lb (138.3 kg)   BMI 36.17 kg/m  Last Weight:  Wt Readings from Last 1 Encounters:  06/05/23 (!) 305 lb (138.3 kg)   Last Height:   Ht Readings from Last 1 Encounters:  06/05/23 6\' 5"  (1.956 m)    Exam: NAD, pleasant                  Speech:    Speech is normal; fluent and spontaneous with normal comprehension.  Cognition:    The patient is oriented to person, place, and time;     recent and remote memory intact;     language fluent;    Cranial Nerves:    The pupils are equal, round, and reactive to light.Trigeminal sensation is intact and the muscles of mastication are normal. incomplete eye closure with bell's phenomenon, left facial droop and inability to raise left eyebrow.The palate elevates in the midline. Hearing intact. Voice is normal. Shoulder shrug is normal. The tongue has normal motion without fasciculations.   Coordination:  No dysmetria  Motor Observation:    No asymmetry, no atrophy, and no involuntary movements noted. Tone:    Normal muscle tone.     Strength:    Strength is V/V in the upper and lower limbs.      Sensation: intact to LT    Assessment/Plan:  Left bell's palsy started late October in the setting of 2 vaccines. MRi of the brain 04/11/2023 showed symmetric contrast enhancement of the mastoid segment of the left facial nerve, consistent with reported Bell's palsy. Started end of October and he still has significant deficits left incomplete eye closure with bell's phenomenon, left facial droop and inability to raise left eyebrow.  Physical Therapy for Bell's Palsy: Bell's Palsy. Please send to [facility] OT bells palsy [phone] ENT [facility] for evaluation of facial nerve transplant or other intervention? Much appreciate my colleague.  Discussed eye care  MRi brain w/wo, MRI orbit face and/or neck w/wo:  reviewed report:  04/11/2023: 1. Pituitary macroadenoma with bilateral cavernous  sinus extension.  The mass contacts the undersurface of the optic chiasm.  2. Asymmetric contrast enhancement of the mastoid segment of the  left facial nerve, consistent with reported Bell's palsy.   Orders Placed This Encounter  Procedures   Ambulatory referral to Occupational Therapy   Ambulatory referral to Neurosurgery   Cc: Wanda Plump, MD,  Wanda Plump, MD  Naomie Dean, MD  [facility] [address] [place], [place] [address]  Phone [phone] Fax [phone]  I spent over 40 minutes of face-to-face and non-face-to-face time with patient on the  1. Facial paralysis/Bells palsy   2. Injury of left facial nerve, initial encounter    diagnosis.  This included previsit chart review, lab review, study review, order entry, electronic health record documentation, patient education on the different diagnostic and therapeutic options, counseling and coordination of care, risks and benefits of management, compliance, or risk factor reduction

## 2023-06-06 ENCOUNTER — Telehealth: Payer: Self-pay

## 2023-06-06 ENCOUNTER — Encounter: Payer: Self-pay | Admitting: Neurology

## 2023-06-06 NOTE — Telephone Encounter (Signed)
 Referral sent to [facility] Occupational [facility] (Bell's Palsy) (P) [phone]

## 2023-06-08 ENCOUNTER — Ambulatory Visit: Payer: 59 | Admitting: Gastroenterology

## 2023-06-11 NOTE — Addendum Note (Signed)
 Addended by: Naomie Dean B on: 06/11/2023 01:07 PM   Modules accepted: Orders

## 2023-06-12 ENCOUNTER — Telehealth: Payer: Self-pay | Admitting: Neurology

## 2023-06-12 NOTE — Telephone Encounter (Signed)
 Referral for ENT fax to [facility]. Phone: [phone], Fax: [phone]

## 2023-06-20 ENCOUNTER — Ambulatory Visit: Admitting: Gastroenterology

## 2023-06-20 ENCOUNTER — Encounter: Payer: Self-pay | Admitting: Gastroenterology

## 2023-06-20 VITALS — BP 100/70 | HR 67 | Ht 77.0 in | Wt 311.0 lb

## 2023-06-20 DIAGNOSIS — R195 Other fecal abnormalities: Secondary | ICD-10-CM | POA: Diagnosis not present

## 2023-06-20 DIAGNOSIS — Z860100 Personal history of colon polyps, unspecified: Secondary | ICD-10-CM | POA: Diagnosis not present

## 2023-06-20 DIAGNOSIS — K915 Postcholecystectomy syndrome: Secondary | ICD-10-CM | POA: Diagnosis not present

## 2023-06-20 DIAGNOSIS — G510 Bell's palsy: Secondary | ICD-10-CM | POA: Diagnosis not present

## 2023-06-20 DIAGNOSIS — K6289 Other specified diseases of anus and rectum: Secondary | ICD-10-CM

## 2023-06-20 DIAGNOSIS — D1391 Familial adenomatous polyposis: Secondary | ICD-10-CM

## 2023-06-20 MED ORDER — COLESTIPOL HCL 1 G PO TABS: 1.0000 g | ORAL_TABLET | Freq: Two times a day (BID) | ORAL | 11 refills | Status: DC | PRN

## 2023-06-20 NOTE — Progress Notes (Signed)
 Chief Complaint: Medication refill Primary GI MD: Dr. Adela Lank  HPI: 63 year old male with medical history as listed below presents for medication refill.  Last seen March 2023 Dennis Castro.  See his note for details. History of postprandial loose stools with urgency since cholecystectomy multiple years ago well-controlled on Colestid 1 GM twice daily as needed  He experiences loose stools following his cholecystectomy, which he manages with colestipol taken primarily at night on an as-needed basis. The medication helps regulate his bowel movements, although he occasionally experiences constipation, particularly when consuming a high-protein diet. Fatty and greasy foods exacerbate his symptoms, and he is cautious about his diet, especially when traveling. No bleeding, pain, nausea, or vomiting, but he mentions occasional tightness, possibly due to hemorrhoids, which he has not found bothersome enough to seek further treatment and they are not bothersome for him right now.  He is currently on a weight management program and has experienced weight gain due to prednisone use. He hopes to lose weight by returning to the gym.  He has a history of acid reflux, which improved with dietary changes.     PREVIOUS GI WORKUP   Colonoscopy 05/24/2012 - left sided diverticulosis, no polyps     EGD 12/07/2015 -  - The exam of the esophagus was otherwise normal. No evidence of Barrett's esophagus or esophagitis was appreciated. - The entire examined stomach was normal. - Nodular mucosa was found at the entrance of the duodenal bulb grossly consistent with benign ecoptic gastric mucosa. Biopsies were taken with a cold forceps for histology to ensure no adenomatous changes. - The exam of the duodenum was otherwise normal.   Diagnosis Surgical [P], small bowel - BENIGN GASTRIC MUCOSA WITH ADJACENT BENIGN SMALL BOWEL MUCOSA, CONSISTENT WITH ECTOPIC GASTRIC MUCOSA. - NO DYSPLASIA OR MALIGNANCY      Colonoscopy 02/21/2019:The perianal and digital rectal examinations were normal. - Multiple medium-mouthed diverticula were found in the sigmoid colon, descending colon and transverse colon. - Internal hemorrhoids were found during retroflexion. The hemorrhoids were small. - The exam was otherwise without abnormality.   Told to repeat colon in 5 years  Past Medical History:  Diagnosis Date   Anxiety    Arthritis    Atypical chest pain    Back pain    Cholelithiasis 09/2015   determined by CT, asymptomatic   Constipation    Coronary artery disease    Diverticulitis    Family history of heart disease    GERD (gastroesophageal reflux disease)    diet related   Hyperlipidemia    IBS (irritable bowel syndrome)    Joint pain    Seasonal allergies    Swelling of lower extremity     Past Surgical History:  Procedure Laterality Date   CARDIAC CATHETERIZATION  04/03/2008   Dr. Allyson Sabal; less than 30% occlusive disease   CHOLECYSTECTOMY N/A 06/14/2018   Procedure: LAPAROSCOPIC CHOLECYSTECTOMY;  Surgeon: Axel Filler, MD;  Location: [facility] OR;  Service: General;  Laterality: N/A;   CT CTA CORONARY W/CA SCORE W/CM &/OR WO/CM  08/04/2021   CT FFR analysis showed flow limitations in the  distal tip of the LAD.Coronary calcium score of 212   INSERTION OF MESH  12/13/2018   Procedure: Insertion Of Mesh;  Surgeon: Axel Filler, MD;  Location: [facility] OR;  Service: General;;   TONSILLECTOMY     age 33   TRANSTHORACIC ECHOCARDIOGRAM  08/02/2021   EF 55-60%,Grade I DD,mild LVH,mild aortic dialation of 43mm   UMBILICAL HERNIA REPAIR N/A  12/13/2018   Procedure: LAPAROSCOPIC UMBILICAL HERNIA REPAIR;  Surgeon: Axel Filler, MD;  Location: [facility] OR;  Service: General;  Laterality: N/A;    Current Outpatient Medications  Medication Sig Dispense Refill   ALPHA LIPOIC ACID PO Take 600 mg by mouth daily.     aspirin EC 81 MG tablet Take 81 mg by mouth daily.     atorvastatin (LIPITOR) 20 MG tablet  TAKE ONE TABLET BY MOUTH EVERY NIGHT AT BEDTIME 90 tablet 3   Cholecalciferol (VITAMIN D3) 1.25 MG (50000 UT) CAPS Take one po every 14 days 6 capsule 0   erythromycin ophthalmic ointment Place a 1/2 inch ribbon of ointment into the lower eyelid. 3.5 g 0   meloxicam (MOBIC) 15 MG tablet Take 1 tablet (15 mg total) by mouth daily. 10 tablet 0   methylcellulose (CITRUCEL) oral powder If needed     metoprolol tartrate (LOPRESSOR) 25 MG tablet TAKE 0.5 TABLET (12.5 MG TOTAL) BY MOUTH TWICE DAILY 90 tablet 3   Multiple Vitamin (MULTIVITAMIN WITH MINERALS) TABS tablet Take 1 tablet by mouth daily.     Omega-3 Fatty Acids (FISH OIL) 1000 MG CAPS Take 2 capsules (2,000 mg total) by mouth daily. 60 capsule 12   sildenafil (REVATIO) 20 MG tablet Take 3-4 tablets (60-80 mg total) by mouth at bedtime as needed. 30 tablet 3   tiZANidine (ZANAFLEX) 4 MG tablet Take 1 tablet (4 mg total) by mouth every 8 (eight) hours as needed for muscle spasms. 30 tablet 0   colestipol (COLESTID) 1 g tablet Take 1 tablet (1 g total) by mouth 2 (two) times daily as needed. 60 tablet 11   No current facility-administered medications for this visit.    Allergies as of 06/20/2023   (No Known Allergies)    Family History  Problem Relation Age of Onset   Diabetes Mother 60   CAD Mother 60       CBAG   Colon cancer Mother    High blood pressure Mother    Heart disease Mother    Neuropathy Mother    Cancer Father        prostate cancer and panceatic cancer   Prostate cancer Father    Breast cancer Maternal Grandmother    Pancreatic cancer Paternal Grandfather    Stroke Neg Hx    Esophageal cancer Neg Hx    Rectal cancer Neg Hx    Stomach cancer Neg Hx     Social History   Socioeconomic History   Marital status: Married    Spouse name: [name]   Number of children: 1   Years of education: Not on file   Highest education level: Not on file  Occupational History   OccupationHydrologist, office job  Tobacco Use    Smoking status: Never   Smokeless tobacco: Never  Vaping Use   Vaping status: Never Used  Substance and Sexual Activity   Alcohol use: No   Drug use: No   Sexual activity: Yes  Other Topics Concern   Not on file  Social History Narrative   Teenage son [name], still at home   Social Drivers of Health   Financial Resource Strain: Not on file  Food Insecurity: Not on file  Transportation Needs: Not on file  Physical Activity: Not on file  Stress: Not on file  Social Connections: Not on file  Intimate Partner Violence: Not on file    Review of Systems:    Constitutional: No weight loss, fever, chills, weakness  or fatigue HEENT: Eyes: No change in vision               Ears, Nose, Throat:  No change in hearing or congestion Skin: No rash or itching Cardiovascular: No chest pain, chest pressure or palpitations   Respiratory: No SOB or cough Gastrointestinal: See HPI and otherwise negative Genitourinary: No dysuria or change in urinary frequency Neurological: No headache, dizziness or syncope Musculoskeletal: No new muscle or joint pain Hematologic: No bleeding or bruising Psychiatric: No history of depression or anxiety    Physical Exam:  Vital signs: BP 100/70   Pulse 67   Ht 6\' 5"  (1.956 m)   Wt (!) 311 lb (141.1 kg)   BMI 36.88 kg/m   Constitutional: NAD, Well developed, Well nourished, alert and cooperative Head:  Normocephalic and atraumatic. Eyes:   PEERL, EOMI. No icterus. Conjunctiva pink. Respiratory: Respirations even and unlabored. Lungs clear to auscultation bilaterally.   No wheezes, crackles, or rhonchi.  Cardiovascular:  Regular rate and rhythm. No peripheral edema, cyanosis or pallor.  Gastrointestinal:  Soft, nondistended, nontender. No rebound or guarding. Normal bowel sounds. No appreciable masses or hepatomegaly. Rectal:  Not performed.  Msk:  Symmetrical without gross deformities. Without edema, no deformity or joint abnormality.  Neurologic:   Alert and  oriented x4;  grossly normal neurologically.  Skin:   Dry and intact without significant lesions or rashes. Psychiatric: Oriented to person, place and time. Demonstrates good judgement and reason without abnormal affect or behaviors.   RELEVANT LABS AND IMAGING: CBC    Component Value Date/Time   WBC 8.9 09/25/2022 1533   RBC 4.94 09/25/2022 1533   HGB 14.3 09/25/2022 1533   HGB 14.8 07/19/2021 0940   HCT 43.1 09/25/2022 1533   HCT 43.5 07/19/2021 0940   PLT 278.0 09/25/2022 1533   PLT 268 07/19/2021 0940   MCV 87.3 09/25/2022 1533   MCV 86 07/19/2021 0940   MCH 29.2 07/19/2021 0940   MCH 29.1 04/24/2020 2241   MCHC 33.1 09/25/2022 1533   RDW 12.8 09/25/2022 1533   RDW 12.7 07/19/2021 0940   LYMPHSABS 1.9 09/25/2022 1533   MONOABS 0.8 09/25/2022 1533   EOSABS 0.3 09/25/2022 1533   BASOSABS 0.1 09/25/2022 1533    CMP     Component Value Date/Time   NA 141 09/25/2022 1533   NA 140 02/15/2022 0806   K 4.2 09/25/2022 1533   CL 105 09/25/2022 1533   CO2 28 09/25/2022 1533   GLUCOSE 74 09/25/2022 1533   BUN 18 09/25/2022 1533   BUN 16 02/15/2022 0806   CREATININE 1.12 09/25/2022 1533   CREATININE 1.07 04/22/2021 1419   CALCIUM 9.5 09/25/2022 1533   PROT 6.5 09/25/2022 1533   PROT 6.3 02/15/2022 0806   ALBUMIN 4.2 09/25/2022 1533   ALBUMIN 4.3 02/15/2022 0806   AST 16 09/25/2022 1533   ALT 24 09/25/2022 1533   ALKPHOS 73 09/25/2022 1533   BILITOT 0.5 09/25/2022 1533   BILITOT 0.3 02/15/2022 0806   GFRNONAA >60 04/24/2020 2241   GFRAA >60 12/05/2018 0928     Assessment/Plan:   Postcholecystectomy syndrome Altered bowel habits Loose stools post-cholecystectomy managed with colestipol. Occasional constipation from high protein intake. Advised dietary monitoring. - Continue colestipol as needed, ideally nightly or every other day. - Monitor dietary intake, especially fatty and greasy foods. - Refill colestipol prescription for one year.  Dietary  management High-protein diet causes occasional constipation.  Advised increased water intake.  Hemorrhoids Occasional discomfort  during bowel movements suggests hemorrhoids versus fissure. No symptoms at this time and declined rectal exam. - Consider OTC hydrocortisone cream or suppositories if symptoms worsen.  History of colon polyps MUTYH heterozygous mutation Due for colonoscopy, last done in 2020. Recommended every 5 years. - Schedule colonoscopy for November 2025. - Contact in September or October to arrange scheduling.  Bell's palsy Bell's palsy affecting hearing, impacting exercise and weight. Encouraged gradual return to exercise. - continue to follow with neurology       This visit required 35 minutes of patient care (this includes precharting, chart review, review of results, face-to-face time used for counseling as well as treatment plan and follow-up. The patient was provided an opportunity to ask questions and all were answered. The patient agreed with the plan and demonstrated an understanding of the instructions.   Lara Mulch [facility] 06/20/2023, 2:38 PM  Cc: Wanda Plump, MD

## 2023-06-20 NOTE — Patient Instructions (Signed)
 We have sent the following medications to your pharmacy for you to pick up at your convenience: Colestipol  Colon due November 2025. You can contact us in mid September to get this procedure scheduled.  Due to recent changes in healthcare laws, you may see the results of your imaging and laboratory studies on MyChart before your provider has had a chance to review them.  We understand that in some cases there may be results that are confusing or concerning to you. Not all laboratory results come back in the same time frame and the provider may be waiting for multiple results in order to interpret others.  Please give [place] 48 hours in order for your provider to thoroughly review all the results before contacting the office for clarification of your results.   Thank you for trusting me with your gastrointestinal care!   Boone Master, PA

## 2023-06-20 NOTE — Progress Notes (Signed)
 Agree with assessment and plan as outlined.

## 2023-06-26 NOTE — Telephone Encounter (Signed)
 Referral for OT was re-faxed to updated fax number -[facility]. [phone]

## 2023-06-26 NOTE — Telephone Encounter (Signed)
 Spoke with patient and answered his questions. He sees [facility] tomorrow afternoon. He said he has had a little improvement in his Bell's Palsy. His mouth has "come up" a little bit. He saw his eye doctor. He hasn't heard from [facility] OT. I told him I would f/u. He was appreciative.   I called [facility] and was given the number specifically for [facility] [phone] (phone) and [phone] (fax). I called [facility] and was told their OT is starting Monday. Unfortunately they did not have pt's referral but we will send again and request for scheduling asap. Send to [phone].

## 2023-07-02 ENCOUNTER — Ambulatory Visit: Admitting: Nurse Practitioner

## 2023-07-02 ENCOUNTER — Encounter: Payer: Self-pay | Admitting: Nurse Practitioner

## 2023-07-02 VITALS — BP 121/79 | HR 67 | Temp 98.1°F | Ht 77.0 in | Wt 294.0 lb

## 2023-07-02 DIAGNOSIS — E66811 Obesity, class 1: Secondary | ICD-10-CM

## 2023-07-02 DIAGNOSIS — Z6834 Body mass index (BMI) 34.0-34.9, adult: Secondary | ICD-10-CM | POA: Diagnosis not present

## 2023-07-02 DIAGNOSIS — E88819 Insulin resistance, unspecified: Secondary | ICD-10-CM

## 2023-07-02 NOTE — Progress Notes (Signed)
 Office: [phone]  /  Fax: [phone]  WEIGHT SUMMARY AND BIOMETRICS  Weight Lost Since Last Visit: 2lb  Weight Gained Since Last Visit: 0lb   Vitals Temp: 98.1 F (36.7 C) BP: 121/79 Pulse Rate: 67 SpO2: 95 %   Anthropometric Measurements Height: 6\' 5"  (1.956 m) Weight: 294 lb (133.4 kg) BMI (Calculated): 34.86 Weight at Last Visit: 296lb Weight Lost Since Last Visit: 2lb Weight Gained Since Last Visit: 0lb Starting Weight: 327lb Total Weight Loss (lbs): 33 lb (15 kg)   Body Composition  Body Fat %: 33.3 % Fat Mass (lbs): 98 lbs Muscle Mass (lbs): 186.6 lbs Total Body Water (lbs): 134.2 lbs Visceral Fat Rating : 19   Other Clinical Data Fasting: No Labs: No Today's Visit #: 17 Starting Date: 10/26/21     HPI  Chief Complaint: OBESITY  Dennis Castro is here to discuss his progress with his obesity treatment plan. He is on the the Category 4 Plan and states he is following his eating plan approximately 70 % of the time. He states he is exercising 60-75 minutes 4-5 days per week.   Interval History:  Since last office visit he has lost 2 pounds.  He notes he is finally eating like he should.  He is focusing on eating protein and vegetables.  He is limiting carbs. He is averaging around 1600 calories, 113 carbs, 69 fats and 132 grams of protein.   He does well with breakfast and lunch.  He struggles from 3 pm-bedtime.  He notes he is a stress eater.  He's not eating as much junk food.  He is drinking water a protein shake daily.  He is going to the gym 4-5 days per week    Goal weight:  250lbs     Pharmacotherapy for weight loss: He is not currently taking medications  for medical weight loss.    Insulin Resistance Last fasting insulin was 29.1. A1c was 5.4. Polyphagia:Yes Medication(s): None Lab Results  Component Value Date   HGBA1C 5.4 09/25/2022   HGBA1C 5.5 02/15/2022   HGBA1C 5.5 10/26/2021   HGBA1C 5.4 04/22/2021   HGBA1C 5.5 03/05/2020    Lab Results  Component Value Date   INSULIN 29.1 (H) 02/15/2022   INSULIN 29.4 (H) 10/26/2021      PHYSICAL EXAM:  Blood pressure 121/79, pulse 67, temperature 98.1 F (36.7 C), height 6\' 5"  (1.956 m), weight 294 lb (133.4 kg), SpO2 95%. Body mass index is 34.86 kg/m.  General: He is overweight, cooperative, alert, well developed, and in no acute distress. PSYCH: Has normal mood, affect and thought process.   Extremities: No edema.  Neurologic: No gross sensory or motor deficits. No tremors or fasciculations noted.    DIAGNOSTIC DATA REVIEWED:  BMET    Component Value Date/Time   NA 141 09/25/2022 1533   NA 140 02/15/2022 0806   K 4.2 09/25/2022 1533   CL 105 09/25/2022 1533   CO2 28 09/25/2022 1533   GLUCOSE 74 09/25/2022 1533   BUN 18 09/25/2022 1533   BUN 16 02/15/2022 0806   CREATININE 1.12 09/25/2022 1533   CREATININE 1.07 04/22/2021 1419   CALCIUM 9.5 09/25/2022 1533   GFRNONAA >60 04/24/2020 2241   GFRAA >60 12/05/2018 0928   Lab Results  Component Value Date   HGBA1C 5.4 09/25/2022   HGBA1C 5.5 07/19/2015   Lab Results  Component Value Date   INSULIN 29.1 (H) 02/15/2022   INSULIN 29.4 (H) 10/26/2021   Lab Results  Component  Value Date   TSH 1.670 10/26/2021   CBC    Component Value Date/Time   WBC 8.9 09/25/2022 1533   RBC 4.94 09/25/2022 1533   HGB 14.3 09/25/2022 1533   HGB 14.8 07/19/2021 0940   HCT 43.1 09/25/2022 1533   HCT 43.5 07/19/2021 0940   PLT 278.0 09/25/2022 1533   PLT 268 07/19/2021 0940   MCV 87.3 09/25/2022 1533   MCV 86 07/19/2021 0940   MCH 29.2 07/19/2021 0940   MCH 29.1 04/24/2020 2241   MCHC 33.1 09/25/2022 1533   RDW 12.8 09/25/2022 1533   RDW 12.7 07/19/2021 0940   Iron Studies No results found for: "IRON", "TIBC", "FERRITIN", "IRONPCTSAT" Lipid Panel     Component Value Date/Time   CHOL 95 09/25/2022 1533   CHOL 110 02/15/2022 0806   TRIG 118.0 09/25/2022 1533   HDL 33.20 (L) 09/25/2022 1533   HDL 39  (L) 02/15/2022 0806   CHOLHDL 3 09/25/2022 1533   VLDL 23.6 09/25/2022 1533   LDLCALC 39 09/25/2022 1533   LDLCALC 51 02/15/2022 0806   LDLCALC 49 04/22/2021 1419   Hepatic Function Panel     Component Value Date/Time   PROT 6.5 09/25/2022 1533   PROT 6.3 02/15/2022 0806   ALBUMIN 4.2 09/25/2022 1533   ALBUMIN 4.3 02/15/2022 0806   AST 16 09/25/2022 1533   ALT 24 09/25/2022 1533   ALKPHOS 73 09/25/2022 1533   BILITOT 0.5 09/25/2022 1533   BILITOT 0.3 02/15/2022 0806   BILIDIR 0.1 03/25/2012 1123      Component Value Date/Time   TSH 1.670 10/26/2021 0848   Nutritional Lab Results  Component Value Date   VD25OH 56.9 02/15/2022   VD25OH 33.8 10/26/2021   VD25OH 38 04/22/2021     ASSESSMENT AND PLAN  TREATMENT PLAN FOR OBESITY:  Recommended Dietary Goals  Dennis Castro is currently in the action stage of change. As such, his goal is to continue weight management plan. He has agreed to keeping a food journal and adhering to recommended goals of 1800-2000 calories and 120+ grams protein.  Needs to increase calories and protein intake.    Behavioral Intervention  We discussed the following Behavioral Modification Strategies today: increasing lean protein intake to established goals, decreasing simple carbohydrates , increasing vegetables, increasing fiber rich foods, increasing water intake , work on meal planning and preparation, reading food labels , keeping healthy foods at home, and continue to work on maintaining a reduced calorie state, getting the recommended amount of protein, incorporating whole foods, making healthy choices, staying well hydrated and practicing mindfulness when eating..  Additional resources provided today: NA  Recommended Physical Activity Goals  Dennis Castro has been advised to work up to 150 minutes of moderate intensity aerobic activity a week and strengthening exercises 2-3 times per week for cardiovascular health, weight loss maintenance and  preservation of muscle mass.   He has agreed to Think about enjoyable ways to increase daily physical activity and overcoming barriers to exercise, Increase physical activity in their day and reduce sedentary time (increase NEAT)., Increase the intensity, frequency or duration of strengthening exercises , and Increase the intensity, frequency or duration of aerobic exercises     Pharmacotherapy   ASSOCIATED CONDITIONS ADDRESSED TODAY  Action/Plan  Insulin resistance Information given on Metformin and insulin resistance.    Dennis Castro will continue to work on weight loss, exercise, and decreasing simple carbohydrates to help decrease the risk of diabetes. Dennis Castro agreed to follow-up with [place] as directed to closely  monitor his progress.   Class 1 obesity due to excess calories with serious comorbidity and body mass index (BMI) of 34.0 to 34.9 in adult       Fasting labs at next visit.    Return in about 4 weeks (around 07/30/2023).[name] He was informed of the importance of frequent follow up visits to maximize his success with intensive lifestyle modifications for his multiple health conditions.   ATTESTASTION STATEMENTS:  Reviewed by clinician on day of visit: allergies, medications, problem list, medical history, surgical history, family history, social history, and previous encounter notes.   Time spent on visit including pre-visit chart review and post-visit care and charting was 30 minutes.    Theodis Sato. Chloe Baig FNP-C

## 2023-07-30 ENCOUNTER — Ambulatory Visit: Admitting: Bariatrics

## 2023-07-30 ENCOUNTER — Encounter: Payer: Self-pay | Admitting: Bariatrics

## 2023-07-30 VITALS — BP 109/74 | HR 58 | Temp 97.8°F | Ht 77.0 in | Wt 289.0 lb

## 2023-07-30 DIAGNOSIS — E559 Vitamin D deficiency, unspecified: Secondary | ICD-10-CM

## 2023-07-30 DIAGNOSIS — E786 Lipoprotein deficiency: Secondary | ICD-10-CM

## 2023-07-30 DIAGNOSIS — E6609 Other obesity due to excess calories: Secondary | ICD-10-CM

## 2023-07-30 DIAGNOSIS — E669 Obesity, unspecified: Secondary | ICD-10-CM

## 2023-07-30 DIAGNOSIS — E88819 Insulin resistance, unspecified: Secondary | ICD-10-CM | POA: Diagnosis not present

## 2023-07-30 DIAGNOSIS — E538 Deficiency of other specified B group vitamins: Secondary | ICD-10-CM | POA: Diagnosis not present

## 2023-07-30 DIAGNOSIS — Z6834 Body mass index (BMI) 34.0-34.9, adult: Secondary | ICD-10-CM

## 2023-07-30 MED ORDER — VITAMIN D3 1.25 MG (50000 UT) PO CAPS: ORAL_CAPSULE | ORAL | 0 refills | Status: DC

## 2023-07-30 NOTE — Progress Notes (Signed)
 WEIGHT SUMMARY AND BIOMETRICS  Weight Lost Since Last Visit: 5lb  Weight Gained Since Last Visit: 0lb   Vitals Temp: 97.8 F (36.6 C) BP: 109/74 Pulse Rate: (!) 58 SpO2: 99 %   Anthropometric Measurements Height: 6\' 5"  (1.956 m) Weight: 289 lb (131.1 kg) BMI (Calculated): 34.26 Weight at Last Visit: 294lb Weight Lost Since Last Visit: 5lb Weight Gained Since Last Visit: 0lb Starting Weight: 327lb Total Weight Loss (lbs): 38 lb (17.2 kg)   Body Composition  Body Fat %: 32.4 % Fat Mass (lbs): 93.8 lbs Muscle Mass (lbs): 186.4 lbs Total Body Water  (lbs): 130.6 lbs Visceral Fat Rating : 19   Other Clinical Data Fasting: Yes Labs: Yes Today's Visit #: 18 Starting Date: 10/26/21    OBESITY Foch is here to discuss his progress with his obesity treatment plan along with follow-up of his obesity related diagnoses.    Nutrition Plan: the Category 4 plan - 70% adherence.  Current exercise: walking and weightlifting  Interim History:  He is done 5 lbs since his last visit.  Eating all of the food on the plan., Protein intake is as prescribed, Is not skipping meals, and Water  intake is adequate.   Hunger is moderately controlled.  Cravings are moderately controlled.  Assessment/Plan:   Vitamin D  Deficiency Vitamin D  is at goal of 50.  Most recent vitamin D  level was 56.9. He is on  prescription ergocalciferol  50,000 IU weekly. Lab Results  Component Value Date   VD25OH 56.9 02/15/2022   VD25OH 33.8 10/26/2021   VD25OH 38 04/22/2021    Plan: Refill prescription vitamin D  50,000 IU weekly.    B 12 deficiency:   He is taking a MV daily .  Plan:  Check B 12 lab today.    Low HDL:   He has a history of a low HDL.  He is exercising on a regular basis and working on his weight.   Plan: Will check his lipid panel today. He will continue  his exercise.   Insulin  Resistance Almin has had elevated fasting insulin  readings. Goal is HgbA1c < 5.7, fasting insulin  at l0 or less, and preferably at 5.  He denies polyphagia. Medication(s): none Lab Results  Component Value Date   HGBA1C 5.4 09/25/2022   Lab Results  Component Value Date   INSULIN  29.1 (H) 02/15/2022   INSULIN  29.4 (H) 10/26/2021    Plan Medication(s): no medications  Will work on the agreed upon plan. Will minimize refined carbohydrates ( sweets and starches), and focus more on complex carbohydrates.  Increase the micronutrients found in leafy greens, which include magnesium, polyphenols, and vitamin C which have been postulated to help with insulin  sensitivity. Minimize "fast food" and cook more meals at home.  Increase fiber to 25 to 30 grams daily.   Labs done today (CMP, Lipids, insulin , vitamin D , B 12).  Generalized Obesity: Current BMI BMI (Calculated): 34.26   Peja is currently in the action stage of change. As such, his goal is to continue with weight loss efforts.  He has agreed to the Category 4 plan.  Exercise goals: All adults should avoid inactivity. Some physical activity is better than none, and adults who participate in any amount of physical activity gain some health benefits. He will increase his aerobic exercise.   Behavioral modification strategies: increasing lean protein intake, no meal skipping, decrease eating out, meal planning , decreasing sodium intake, and mindful eating.  Clayton has agreed to follow-up with our clinic in 4 weeks.    Objective:   VITALS: Per patient if applicable, see vitals. GENERAL: Alert and in no acute distress. CARDIOPULMONARY: No increased WOB. Speaking in clear sentences.  PSYCH: Pleasant and cooperative. Speech normal rate and rhythm. Affect is appropriate. Insight and judgement are appropriate. Attention is focused, linear, and appropriate.  NEURO: Oriented as arrived to appointment on  time with no prompting.   Attestation Statements:    This was prepared with the assistance of Engineer, civil (consulting).  Occasional wrong-word or sound-a-like substitutions may have occurred due to the inherent limitations of voice recognition   Kirk Peper, DO

## 2023-07-31 ENCOUNTER — Encounter: Payer: Self-pay | Admitting: Bariatrics

## 2023-07-31 LAB — COMPREHENSIVE METABOLIC PANEL WITH GFR
ALT: 31 IU/L (ref 0–44)
AST: 19 IU/L (ref 0–40)
Albumin: 4.2 g/dL (ref 3.9–4.9)
Alkaline Phosphatase: 96 IU/L (ref 44–121)
BUN/Creatinine Ratio: 15 (ref 10–24)
BUN: 14 mg/dL (ref 8–27)
Bilirubin Total: 0.4 mg/dL (ref 0.0–1.2)
CO2: 23 mmol/L (ref 20–29)
Calcium: 9.5 mg/dL (ref 8.6–10.2)
Chloride: 106 mmol/L (ref 96–106)
Creatinine, Ser: 0.92 mg/dL (ref 0.76–1.27)
Globulin, Total: 1.9 g/dL (ref 1.5–4.5)
Glucose: 91 mg/dL (ref 70–99)
Potassium: 4.5 mmol/L (ref 3.5–5.2)
Sodium: 142 mmol/L (ref 134–144)
Total Protein: 6.1 g/dL (ref 6.0–8.5)
eGFR: 95 mL/min/{1.73_m2} (ref >59)

## 2023-07-31 LAB — LIPID PANEL WITH LDL/HDL RATIO
Cholesterol, Total: 117 mg/dL (ref 100–199)
HDL: 41 mg/dL (ref >39)
LDL Chol Calc (NIH): 58 mg/dL (ref 0–99)
LDL/HDL Ratio: 1.4 ratio (ref 0.0–3.6)
Triglycerides: 96 mg/dL (ref 0–149)
VLDL Cholesterol Cal: 18 mg/dL (ref 5–40)

## 2023-07-31 LAB — VITAMIN B12: Vitamin B-12: 421 pg/mL (ref 232–1245)

## 2023-07-31 LAB — VITAMIN D 25 HYDROXY (VIT D DEFICIENCY, FRACTURES): Vit D, 25-Hydroxy: 43.4 ng/mL (ref 30.0–100.0)

## 2023-07-31 LAB — INSULIN, RANDOM: INSULIN: 24.8 u[IU]/mL (ref 2.6–24.9)

## 2023-08-28 ENCOUNTER — Ambulatory Visit: Admitting: Bariatrics

## 2023-08-28 ENCOUNTER — Encounter: Payer: Self-pay | Admitting: Bariatrics

## 2023-08-28 VITALS — BP 114/78 | HR 59 | Temp 98.1°F | Ht 77.0 in | Wt 280.0 lb

## 2023-08-28 DIAGNOSIS — Z6833 Body mass index (BMI) 33.0-33.9, adult: Secondary | ICD-10-CM

## 2023-08-28 DIAGNOSIS — E559 Vitamin D deficiency, unspecified: Secondary | ICD-10-CM | POA: Diagnosis not present

## 2023-08-28 DIAGNOSIS — E782 Mixed hyperlipidemia: Secondary | ICD-10-CM

## 2023-08-28 DIAGNOSIS — E669 Obesity, unspecified: Secondary | ICD-10-CM | POA: Diagnosis not present

## 2023-08-28 DIAGNOSIS — E66811 Obesity, class 1: Secondary | ICD-10-CM

## 2023-08-28 DIAGNOSIS — E785 Hyperlipidemia, unspecified: Secondary | ICD-10-CM | POA: Diagnosis not present

## 2023-08-28 MED ORDER — VITAMIN D3 1.25 MG (50000 UT) PO CAPS: ORAL_CAPSULE | ORAL | 0 refills | Status: DC

## 2023-08-28 NOTE — Progress Notes (Signed)
 WEIGHT SUMMARY AND BIOMETRICS  Weight Lost Since Last Visit: 9lb  Weight Gained Since Last Visit: 0   Vitals Temp: 98.1 F (36.7 C) BP: 114/78 Pulse Rate: (!) 59 SpO2: 97 %   Anthropometric Measurements Height: 6\' 5"  (1.956 m) Weight: 280 lb (127 kg) BMI (Calculated): 33.2 Weight at Last Visit: 289lb Weight Lost Since Last Visit: 9lb Weight Gained Since Last Visit: 0 Starting Weight: 327lb Total Weight Loss (lbs): 47 lb (21.3 kg)   Body Composition  Body Fat %: 32 % Fat Mass (lbs): 89.8 lbs Muscle Mass (lbs): 181.4 lbs Total Body Water  (lbs): 129.2 lbs Visceral Fat Rating : 18   Other Clinical Data Fasting: no Labs: no Today's Visit #: 19 Starting Date: 10/27/23    OBESITY Sharvil is here to discuss his progress with his obesity treatment plan along with follow-up of his obesity related diagnoses.    Nutrition Plan: the Category 4 plan - 90% adherence.  Current exercise: weightlifting and treadmill.  Interim History:  He is down 9 lbs since his last visit.  Eating all of the food on the plan., Protein intake is as prescribed, Is not skipping meals, and Meeting calorie goals.  Hunger is moderately controlled.  Cravings are moderately controlled.  Assessment/Plan:   Vitamin D  Deficiency Vitamin D  is not at goal of 50.  Most recent vitamin D  level was 43.4. He is on  prescription ergocalciferol  50,000 IU weekly. Lab Results  Component Value Date   VD25OH 43.4 07/30/2023   VD25OH 56.9 02/15/2022   VD25OH 33.8 10/26/2021    Plan: Refill prescription vitamin D  50,000 IU weekly.  He will take his vitamin D  consistently. We will recheck in the future.  Hyperlipidemia LDL is at goal. Medication(s): Lipitor Cardiovascular risk factors: advanced age (older than 44 for men, 79 for women), dyslipidemia, obesity (BMI >= 30 kg/m2), and  sedentary lifestyle  Lab Results  Component Value Date   CHOL 117 07/30/2023   HDL 41 07/30/2023   LDLCALC 58 07/30/2023   TRIG 96 07/30/2023   CHOLHDL 3 09/25/2022   Lab Results  Component Value Date   ALT 31 07/30/2023   AST 19 07/30/2023   ALKPHOS 96 07/30/2023   BILITOT 0.4 07/30/2023   The ASCVD Risk score (Arnett DK, et al., 2019) failed to calculate for the following reasons:   The valid total cholesterol range is 130 to 320 mg/dL  Plan:  Continue statin.  Information sheet on healthy vs unhealthy fats.  Will avoid all trans fats.  Will read labels Will minimize saturated fats except the following: low fat meats in moderation, diary, and limited dark chocolate.  Will continue his exercise doing both cardio and resistance training.     Generalized Obesity: Current BMI BMI (Calculated): 33.2    Saylor is currently in the action stage of change. As such, his goal is  to continue with weight loss efforts.  He has agreed to the Category 4 plan.  Exercise goals: For substantial health benefits, adults should do at least 150 minutes (2 hours and 30 minutes) a week of moderate-intensity, or 75 minutes (1 hour and 15 minutes) a week of vigorous-intensity aerobic physical activity, or an equivalent combination of moderate- and vigorous-intensity aerobic activity. Aerobic activity should be performed in episodes of at least 10 minutes, and preferably, it should be spread throughout the week.  Behavioral modification strategies: increasing lean protein intake, no meal skipping, decrease eating out, meal planning , increase water  intake, better snacking choices, planning for success, increasing fiber rich foods, avoiding temptations, keep healthy foods in the home, weigh protein portions, measure portion sizes, pack lunch for work, and mindful eating.  Miguelangel has agreed to follow-up with our clinic in 4 weeks.   Objective:   VITALS: Per patient if applicable, see vitals. GENERAL:  Alert and in no acute distress. CARDIOPULMONARY: No increased WOB. Speaking in clear sentences.  PSYCH: Pleasant and cooperative. Speech normal rate and rhythm. Affect is appropriate. Insight and judgement are appropriate. Attention is focused, linear, and appropriate.  NEURO: Oriented as arrived to appointment on time with no prompting.   Attestation Statements:   This was prepared with the assistance of Engineer, civil (consulting).  Occasional wrong-word or sound-a-like substitutions may have occurred due to the inherent limitations of voice recognition   Kirk Peper, DO

## 2023-09-19 ENCOUNTER — Other Ambulatory Visit: Payer: Self-pay | Admitting: Neurosurgery

## 2023-09-19 DIAGNOSIS — E236 Other disorders of pituitary gland: Secondary | ICD-10-CM

## 2023-09-26 ENCOUNTER — Other Ambulatory Visit: Payer: Self-pay | Admitting: Cardiovascular Disease

## 2023-09-26 ENCOUNTER — Encounter: Payer: 59 | Admitting: Internal Medicine

## 2023-09-26 ENCOUNTER — Other Ambulatory Visit: Payer: Self-pay | Admitting: Gastroenterology

## 2023-09-26 ENCOUNTER — Encounter: Payer: Self-pay | Admitting: Bariatrics

## 2023-09-26 ENCOUNTER — Ambulatory Visit: Admitting: Bariatrics

## 2023-09-26 VITALS — BP 108/72 | HR 57 | Temp 97.9°F | Ht 77.0 in | Wt 278.0 lb

## 2023-09-26 DIAGNOSIS — E88819 Insulin resistance, unspecified: Secondary | ICD-10-CM | POA: Diagnosis not present

## 2023-09-26 DIAGNOSIS — Z6832 Body mass index (BMI) 32.0-32.9, adult: Secondary | ICD-10-CM | POA: Diagnosis not present

## 2023-09-26 DIAGNOSIS — E6609 Other obesity due to excess calories: Secondary | ICD-10-CM

## 2023-09-26 DIAGNOSIS — E559 Vitamin D deficiency, unspecified: Secondary | ICD-10-CM

## 2023-09-26 DIAGNOSIS — E669 Obesity, unspecified: Secondary | ICD-10-CM | POA: Diagnosis not present

## 2023-09-26 MED ORDER — VITAMIN D3 1.25 MG (50000 UT) PO CAPS: ORAL_CAPSULE | ORAL | 0 refills | Status: DC

## 2023-09-26 NOTE — Progress Notes (Signed)
 WEIGHT SUMMARY AND BIOMETRICS  Weight Lost Since Last Visit: 2lb  Weight Gained Since Last Visit: 0   Vitals Temp: 97.9 F (36.6 C) BP: 108/72 Pulse Rate: (!) 57 SpO2: 95 %   Anthropometric Measurements Height: 6' 5 (1.956 m) Weight: 278 lb (126.1 kg) BMI (Calculated): 32.96 Weight at Last Visit: 280lb Weight Lost Since Last Visit: 2lb Weight Gained Since Last Visit: 0 Starting Weight: 327lb Total Weight Loss (lbs): 49 lb (22.2 kg)   Body Composition  Body Fat %: 31.1 % Fat Mass (lbs): 86.6 lbs Muscle Mass (lbs): 182.2 lbs Total Body Water  (lbs): 128.6 lbs Visceral Fat Rating : 18   Other Clinical Data Fasting: no Labs: no Today's Visit #: 20 Starting Date: 10/26/21    OBESITY Giancarlos is here to discuss his progress with his obesity treatment plan along with follow-up of his obesity related diagnoses.    Nutrition Plan: the Category 4 plan - 75% adherence.  Current exercise: weightlifting and treadmill  Interim History:  He is down another 2 lbs since his last visit. He is exercising on a regular basis with all muscle groups.He is doing step -up on boxes. SABRA He states that he had Father's Day and a celebration.  Eating all of the food on the plan., Protein intake is as prescribed, Is not skipping meals, and Water  intake is adequate.  Hunger is moderately controlled.  Cravings are moderately controlled.  Assessment/Plan:    Vitamin D  Deficiency Vitamin D  is not at goal of 50.  Most recent vitamin D  level was 43.4. He is on  prescription ergocalciferol  50,000 IU weekly. Lab Results  Component Value Date   VD25OH 43.4 07/30/2023   VD25OH 56.9 02/15/2022   VD25OH 33.8 10/26/2021    Plan: Refill prescription vitamin D  50,000 IU weekly.   Insulin  Resistance Mister has had elevated fasting insulin  readings. Goal is HgbA1c < 5.7, fasting  insulin  at l0 or less, and preferably at 5.  He denies polyphagia. Lab Results  Component Value Date   HGBA1C 5.4 09/25/2022   Lab Results  Component Value Date   INSULIN  24.8 07/30/2023   INSULIN  29.1 (H) 02/15/2022   INSULIN  29.4 (H) 10/26/2021    Plan Will work on the agreed upon plan. Will minimize refined carbohydrates ( sweets and starches), and focus more on complex carbohydrates.  Increase the micronutrients found in leafy greens, which include magnesium, polyphenols, and vitamin C which have been postulated to help with insulin  sensitivity. Minimize fast food and cook more meals at home.  Increase fiber to 25 to 30 grams daily.  Information sheets on healthy carbohydrates and protein. We discussed his synopsis in detail and came up with a goal of less than 30 for his body fat and ultimately 25% body fat.    Generalized Obesity: Current BMI BMI (Calculated): 32.96    Donevin is currently  in the action stage of change. As such, his goal is to continue with weight loss efforts.  He has agreed to the Category 4 plan.  Exercise goals: cardio and resistance   Behavioral modification strategies: increasing lean protein intake, no meal skipping, meal planning , better snacking choices, planning for success, increasing fiber rich foods, decrease snacking , keep healthy foods in the home, weigh protein portions, work on smaller portions, and mindful eating.  Saahil has agreed to follow-up with our clinic in 4 weeks.    Objective:   VITALS: Per patient if applicable, see vitals. GENERAL: Alert and in no acute distress. CARDIOPULMONARY: No increased WOB. Speaking in clear sentences.  PSYCH: Pleasant and cooperative. Speech normal rate and rhythm. Affect is appropriate. Insight and judgement are appropriate. Attention is focused, linear, and appropriate.  NEURO: Oriented as arrived to appointment on time with no prompting.   Attestation Statements:   This was prepared with  the assistance of Engineer, civil (consulting).  Occasional wrong-word or sound-a-like substitutions may have occurred due to the inherent limitations of voice recognition

## 2023-09-27 ENCOUNTER — Ambulatory Visit: Admitting: Bariatrics

## 2023-10-01 ENCOUNTER — Encounter: Payer: Self-pay | Admitting: Internal Medicine

## 2023-10-01 ENCOUNTER — Ambulatory Visit (INDEPENDENT_AMBULATORY_CARE_PROVIDER_SITE_OTHER): Admitting: Internal Medicine

## 2023-10-01 VITALS — BP 118/68 | HR 55 | Temp 98.4°F | Resp 18 | Ht 77.0 in | Wt 283.0 lb

## 2023-10-01 DIAGNOSIS — I7121 Aneurysm of the ascending aorta, without rupture: Secondary | ICD-10-CM

## 2023-10-01 DIAGNOSIS — E782 Mixed hyperlipidemia: Secondary | ICD-10-CM

## 2023-10-01 DIAGNOSIS — G510 Bell's palsy: Secondary | ICD-10-CM | POA: Diagnosis not present

## 2023-10-01 DIAGNOSIS — Z0001 Encounter for general adult medical examination with abnormal findings: Secondary | ICD-10-CM | POA: Diagnosis not present

## 2023-10-01 DIAGNOSIS — R399 Unspecified symptoms and signs involving the genitourinary system: Secondary | ICD-10-CM

## 2023-10-01 DIAGNOSIS — I2510 Atherosclerotic heart disease of native coronary artery without angina pectoris: Secondary | ICD-10-CM

## 2023-10-01 DIAGNOSIS — Z0000 Encounter for general adult medical examination without abnormal findings: Secondary | ICD-10-CM | POA: Diagnosis not present

## 2023-10-01 MED ORDER — NAPROXEN SODIUM 220 MG PO TABS: 220.0000 mg | ORAL_TABLET | Freq: Every day | ORAL | Status: AC | PRN

## 2023-10-01 MED ORDER — HYDROCORTISONE 2.5 % EX CREA: TOPICAL_CREAM | Freq: Two times a day (BID) | CUTANEOUS | 3 refills | Status: AC

## 2023-10-01 NOTE — Progress Notes (Signed)
 Subjective:    Patient ID: Dennis FORBES Chesley Mickey., male    DOB: 1960-08-07, 63 y.o.   MRN: [id number]  DOS:  10/01/2023 Type of visit - description: Here for CPX  Here for CPX Chronic medical problems addressed. Saw ENT for history of Bell's palsy.  Note reviewed. Admits to occasionally increasing urinary frequency, flow is slightly low at times. Denies dysuria or gross hematuria.  Wt Readings from Last 3 Encounters:  10/01/23 283 lb (128.4 kg)  09/26/23 278 lb (126.1 kg)  08/28/23 280 lb (127 kg)    Review of Systems  Other than above, a 14 point review of systems is negative    Past Medical History:  Diagnosis Date   Anxiety    Arthritis    Atypical chest pain    Back pain    Cholelithiasis 09/2015   determined by CT, asymptomatic   Constipation    Coronary artery disease    Diverticulitis    Family history of heart disease    GERD (gastroesophageal reflux disease)    diet related   Hyperlipidemia    IBS (irritable bowel syndrome)    Joint pain    Seasonal allergies    Swelling of lower extremity     Past Surgical History:  Procedure Laterality Date   CARDIAC CATHETERIZATION  04/03/2008   Dr. Court; less than 30% occlusive disease   CHOLECYSTECTOMY N/A 06/14/2018   Procedure: LAPAROSCOPIC CHOLECYSTECTOMY;  Surgeon: Rubin Calamity, MD;  Location: [facility] OR;  Service: General;  Laterality: N/A;   CT CTA CORONARY W/CA SCORE W/CM &/OR WO/CM  08/04/2021   CT FFR analysis showed flow limitations in the  distal tip of the LAD.Coronary calcium  score of 212   INSERTION OF MESH  12/13/2018   Procedure: Insertion Of Mesh;  Surgeon: Rubin Calamity, MD;  Location: [facility] OR;  Service: General;;   TONSILLECTOMY     age 41   TRANSTHORACIC ECHOCARDIOGRAM  08/02/2021   EF 55-60%,Grade I DD,mild LVH,mild aortic dialation of 43mm   UMBILICAL HERNIA REPAIR N/A 12/13/2018   Procedure: LAPAROSCOPIC UMBILICAL HERNIA REPAIR;  Surgeon: Rubin Calamity, MD;  Location: [facility] OR;  Service:  General;  Laterality: N/A;   Social History   Socioeconomic History   Marital status: Married    Spouse name: Jamie   Number of children: 1   Years of education: Not on file   Highest education level: Not on file  Occupational History   OccupationHydrologist, office job  Tobacco Use   Smoking status: Never   Smokeless tobacco: Never  Vaping Use   Vaping status: Never Used  Substance and Sexual Activity   Alcohol use: No   Drug use: No   Sexual activity: Yes  Other Topics Concern   Not on file  Social History Narrative   Teenage son graduated from [employer], lives w/ them   Social Drivers of Corporate investment banker Strain: Not on file  Food Insecurity: Not on file  Transportation Needs: Not on file  Physical Activity: Not on file  Stress: Not on file  Social Connections: Not on file  Intimate Partner Violence: Not on file     Current Outpatient Medications  Medication Instructions   ALPHA LIPOIC ACID PO 600 mg, Daily   aspirin EC 81 mg, Daily   atorvastatin  (LIPITOR) 20 mg, Oral, Daily at bedtime   Cholecalciferol (VITAMIN D3) 1.25 MG (50000 UT) CAPS Take one po every 14 days   colestipol  (COLESTID ) 1 g tablet TAKE  1 TABLET (1 GIVE TOTAL) BY MOUTH 2 (TWO) TIMES DAILY AS NEEDED. PLEASE KEEP YOUR MARCH 7 APPOINTMENT FOR FURTHER REFILLS. THANK YOU   erythromycin  ophthalmic ointment Place a 1/2 inch ribbon of ointment into the lower eyelid.   Fish Oil  2,000 mg, Oral, Daily   hydrocortisone  2.5 % cream Topical, 2 times daily   methylcellulose (CITRUCEL) oral powder If needed   metoprolol  tartrate (LOPRESSOR ) 25 MG tablet TAKE 0.5 TABLET (12.5 MG TOTAL) BY MOUTH TWICE DAILY   Multiple Vitamin (MULTIVITAMIN WITH MINERALS) TABS tablet 1 tablet, Daily   naproxen sodium (ALEVE) 220 mg, Oral, Daily PRN   sildenafil  (REVATIO ) 60-80 mg, Oral, At bedtime PRN   tiZANidine  (ZANAFLEX ) 4 mg, Oral, Every 8 hours PRN       Objective:   Physical Exam BP 118/68   Pulse (!) 55    Temp 98.4 F (36.9 C) (Oral)   Resp 18   Ht 6' 5 (1.956 m)   Wt 283 lb (128.4 kg)   SpO2 95%   BMI 33.56 kg/m  General: Well developed, NAD, BMI noted Neck: No  thyromegaly  HEENT:  Normocephalic . Face: Left-sided weakness from Bell's palsy is very subtle Lungs:  CTA B Normal respiratory effort, no intercostal retractions, no accessory muscle use. Heart: RRR,  no murmur.  Abdomen:  Not distended, soft, non-tender. No rebound or rigidity. DRE: Normal sphincter tone, no stools, prostate normal Lower extremities: no pretibial edema bilaterally  Skin: Exposed areas without rash. Not pale. Not jaundice Neurologic:  alert & oriented X3.  Speech normal, gait appropriate for age and unassisted Strength symmetric and appropriate for age.  Psych: Cognition and judgment appear intact.  Cooperative with normal attention span and concentration.  Behavior appropriate. No anxious or depressed appearing.     Assessment     Assessment   Hyperlipidemia   GERD Obesity , Morbid  CAD:  --Chest pain: Myoview stress test subtle anterior ischemia --Cardiac cath 06-24-09: Noncritical 30% LAD with subtle anteroapical HK. -- CTA 08/2021: Calcium  score 212, see full report. --Thoracic aortic aneurysm Diverticulitis Seasonal allergies +FH prostate ca (father age 20) +FH CAD (mother age 14) kidney  Stones Neuropathy (Dx at neurology 06-2021) MUTYH  heterozygous mutation (per GI note reviewed)   PLAN: Here for CPX - Td  2016 - shingrix  x2 -Vaccines I recommend: COVID booster , PNM 20 and flu shot (fall) - CCS: Colonoscopy-2014: Diverticuli, no polyps.  Per genetic counseling evaluation, was rec to repeat a colonoscopy, done 02/21/2019.  Next 5 years per Cscope report.  Patient aware, recommend to contact GI if he does not get a phone call. - Prostate cancer  screening: + FH, mild LUTS, DRE negative today, check a PSA UA urine culture.  Further advised for results - Diet, exercise: Doing  great.- Recent labs reviewed, will get a CBC TSH PSA UA urine culture  Other issues addressed today Hyperlipidemia: On Lipitor, colestipol , last LDL very good. CAD, aortic aneurysm: Asymptomatic, on aspirin, atorvastatin , Colestid . Morbid obesity: Under the care of of the wellness clinic. Weight a year ago was 308 pounds, weight today 283 pounds.  Doing great with lifestyle, exercises daily, doing better with diet. L Bell's palsy: Saw ENT 09/14/2023.  Options discussed. The patient noted some improvement, doing PT; will wait for the 1 year mark to discuss further steps if symptoms persist. Rash: Also, has a on and off slightly erythematous and scaly patch on the left forearm.  Probably eczema.  Prescribed hydrocortisone . Occasional back  pain: On Aleve, take it very seldom, GI precautions discussed. RTC 1 year

## 2023-10-01 NOTE — Assessment & Plan Note (Signed)
 Here for CPX - Td  2016 - shingrix  x2 -Vaccines I recommend: COVID booster , PNM 20 and flu shot (fall) - CCS: Colonoscopy-2014: Diverticuli, no polyps.  Per genetic counseling evaluation, was rec to repeat a colonoscopy, done 02/21/2019.  Next 5 years per Cscope report.  Patient aware, recommend to contact GI if he does not get a phone call. - Prostate cancer  screening: + FH, mild LUTS, DRE negative today, check a PSA UA urine culture.  Further advised for results - Diet, exercise: Doing great.- Recent labs reviewed, will get a CBC TSH PSA UA urine culture

## 2023-10-01 NOTE — Assessment & Plan Note (Signed)
 Here for CPX Other issues addressed today Hyperlipidemia: On Lipitor, colestipol , last LDL very good. CAD, aortic aneurysm: Asymptomatic, on aspirin, atorvastatin , Colestid . Morbid obesity: Under the care of of the wellness clinic. Weight a year ago was 308 pounds, weight today 283 pounds.  Doing great with lifestyle, exercises daily, doing better with diet. L Bell's palsy: Saw ENT 09/14/2023.  Options discussed. The patient noted some improvement, doing PT; will wait for the 1 year mark to discuss further steps if symptoms persist. Rash: Also, has a on and off slightly erythematous and scaly patch on the left forearm.  Probably eczema.  Prescribed hydrocortisone . Occasional back pain: On Aleve, take it very seldom, GI precautions discussed. RTC 1 year

## 2023-10-01 NOTE — Patient Instructions (Addendum)
   Vaccines to consider: Pneumonia shot Flu shot every fall COVID booster (from 12/2022)   Your next colonoscopy is due November 2025.  Continue your excellent lifestyle  GO TO THE LAB :  Get the blood work   Your results will be posted on MyChart with my comments  Next office visit for a physical exam in 1 year Please make an appointment before you leave today

## 2023-10-02 LAB — CBC WITH DIFFERENTIAL/PLATELET
Basophils Absolute: 0 10*3/uL (ref 0.0–0.1)
Basophils Relative: 0.4 % (ref 0.0–3.0)
Eosinophils Absolute: 0.3 10*3/uL (ref 0.0–0.7)
Eosinophils Relative: 3.1 % (ref 0.0–5.0)
HCT: 43.9 % (ref 39.0–52.0)
Hemoglobin: 14.3 g/dL (ref 13.0–17.0)
Lymphocytes Relative: 20.7 % (ref 12.0–46.0)
Lymphs Abs: 1.9 10*3/uL (ref 0.7–4.0)
MCHC: 32.6 g/dL (ref 30.0–36.0)
MCV: 88.8 fl (ref 78.0–100.0)
Monocytes Absolute: 0.7 10*3/uL (ref 0.1–1.0)
Monocytes Relative: 7.2 % (ref 3.0–12.0)
Neutro Abs: 6.3 10*3/uL (ref 1.4–7.7)
Neutrophils Relative %: 68.6 % (ref 43.0–77.0)
Platelets: 269 10*3/uL (ref 150.0–400.0)
RBC: 4.94 Mil/uL (ref 4.22–5.81)
RDW: 13.9 % (ref 11.5–15.5)
WBC: 9.2 10*3/uL (ref 4.0–10.5)

## 2023-10-02 LAB — URINALYSIS, ROUTINE W REFLEX MICROSCOPIC
Bilirubin Urine: NEGATIVE
Ketones, ur: NEGATIVE
Leukocytes,Ua: NEGATIVE
Nitrite: NEGATIVE
Specific Gravity, Urine: 1.025 (ref 1.000–1.030)
Total Protein, Urine: NEGATIVE
Urine Glucose: NEGATIVE
Urobilinogen, UA: 0.2 (ref 0.0–1.0)
pH: 6 (ref 5.0–8.0)

## 2023-10-02 LAB — TSH: TSH: 1.45 u[IU]/mL (ref 0.35–5.50)

## 2023-10-02 LAB — PSA: PSA: 1.36 ng/mL (ref 0.10–4.00)

## 2023-10-03 LAB — URINE CULTURE
MICRO NUMBER:: 16646219
Result:: NO GROWTH
SPECIMEN QUALITY:: ADEQUATE

## 2023-10-05 ENCOUNTER — Ambulatory Visit: Payer: Self-pay | Admitting: Internal Medicine

## 2023-10-22 ENCOUNTER — Encounter: Payer: Self-pay | Admitting: Bariatrics

## 2023-10-22 ENCOUNTER — Ambulatory Visit: Admitting: Bariatrics

## 2023-10-22 VITALS — BP 112/74 | HR 64 | Temp 97.7°F | Ht 77.0 in | Wt 273.0 lb

## 2023-10-22 DIAGNOSIS — E669 Obesity, unspecified: Secondary | ICD-10-CM | POA: Diagnosis not present

## 2023-10-22 DIAGNOSIS — Z6832 Body mass index (BMI) 32.0-32.9, adult: Secondary | ICD-10-CM

## 2023-10-22 DIAGNOSIS — E559 Vitamin D deficiency, unspecified: Secondary | ICD-10-CM

## 2023-10-22 DIAGNOSIS — E6609 Other obesity due to excess calories: Secondary | ICD-10-CM

## 2023-10-22 DIAGNOSIS — E782 Mixed hyperlipidemia: Secondary | ICD-10-CM

## 2023-10-22 DIAGNOSIS — E785 Hyperlipidemia, unspecified: Secondary | ICD-10-CM

## 2023-10-22 MED ORDER — VITAMIN D3 1.25 MG (50000 UT) PO CAPS: ORAL_CAPSULE | ORAL | 0 refills | Status: DC

## 2023-10-22 NOTE — Progress Notes (Signed)
 WEIGHT SUMMARY AND BIOMETRICS  Weight Lost Since Last Visit: 5lb  Weight Gained Since Last Visit: 0   Vitals Temp: 97.7 F (36.5 C) BP: 112/74 Pulse Rate: 64 SpO2: 95 %   Anthropometric Measurements Height: 6' 5 (1.956 m) Weight: 273 lb (123.8 kg) BMI (Calculated): 32.37 Weight at Last Visit: 278lb Weight Lost Since Last Visit: 5lb Weight Gained Since Last Visit: 0 Starting Weight: 327lb Total Weight Loss (lbs): 52 lb (23.6 kg)   Body Composition  Body Fat %: 30.7 % Fat Mass (lbs): 84 lbs Muscle Mass (lbs): 180.4 lbs Total Body Water  (lbs): 126.2 lbs Visceral Fat Rating : 17   Other Clinical Data Fasting: no Labs: no Today's Visit #: 21 Starting Date: 10/26/21    OBESITY Abhijay is here to discuss his progress with his obesity treatment plan along with follow-up of his obesity related diagnoses.    Nutrition Plan: the Category 4 plan - 75% adherence.  Current exercise: Goes to the gym for exercise.  Interim History:  He is down another 5 lbs.  Eating all of the food on the plan., Meeting protein goals., Water  intake is adequate., and Denies polyphagia  Hunger is moderately controlled.  Cravings are moderately controlled.  Assessment/Plan:   Vitamin D  Deficiency Vitamin D  is not at goal of 50.  Most recent vitamin D  level was 43.4. He is on  prescription ergocalciferol  50,000 IU weekly. Lab Results  Component Value Date   VD25OH 43.4 07/30/2023   VD25OH 56.9 02/15/2022   VD25OH 33.8 10/26/2021    Plan: Refill prescription vitamin D  50,000 IU weekly.   Hyperlipidemia LDL is at goal. Medication(s): Lipitor Cardiovascular risk factors: dyslipidemia, hypertension, and obesity (BMI >= 30 kg/m2)  Lab Results  Component Value Date   CHOL 117 07/30/2023   HDL 41 07/30/2023   LDLCALC 58 07/30/2023   TRIG 96 07/30/2023   CHOLHDL 3  09/25/2022   Lab Results  Component Value Date   ALT 31 07/30/2023   AST 19 07/30/2023   ALKPHOS 96 07/30/2023   BILITOT 0.4 07/30/2023   The ASCVD Risk score (Arnett DK, et al., 2019) failed to calculate for the following reasons:   The valid total cholesterol range is 130 to 320 mg/dL  Plan:  Continue statin.  Will avoid all trans fats.  Will read labels Will minimize saturated fats except the following: low fat meats in moderation, diary, and limited dark chocolate.  Increase Omega 3 in foods, and consider an Omega 3 supplement.  Handouts for healthy carbohydrates and protein.      Generalized Obesity: Current BMI BMI (Calculated): 32.37  [place] is currently in the action stage of change. As such, his goal is to continue with weight loss efforts.  He has agreed to the Category 4 plan.  Exercise goals: All adults should avoid inactivity. Some physical activity is better than  none, and adults who participate in any amount of physical activity gain some health benefits.  Behavioral modification strategies: increasing lean protein intake, decreasing simple carbohydrates , no meal skipping, meal planning , increase water  intake, better snacking choices, planning for success, increasing vegetables, decrease snacking , avoiding temptations, keep healthy foods in the home, weigh protein portions, measure portion sizes, and mindful eating.  Kenaz has agreed to follow-up with our clinic in 4 weeks.   Objective:   VITALS: Per patient if applicable, see vitals. GENERAL: Alert and in no acute distress. CARDIOPULMONARY: No increased WOB. Speaking in clear sentences.  PSYCH: Pleasant and cooperative. Speech normal rate and rhythm. Affect is appropriate. Insight and judgement are appropriate. Attention is focused, linear, and appropriate.  NEURO: Oriented as arrived to appointment on time with no prompting.   Attestation Statements:   This was prepared with the assistance of Licensed conveyancer.  Occasional wrong-word or sound-a-like substitutions may have occurred due to the inherent limitations of voice recognition   Clayborne Daring, DO

## 2023-10-22 NOTE — Progress Notes (Deleted)
                                                                                                              WEIGHT SUMMARY AND BIOMETRICS  No data recorded No data recorded  No data recorded No data recorded No data recorded No data recorded  OBESITY Dennis Castro is here to discuss his progress with his obesity treatment plan along with follow-up of his obesity related diagnoses.    Nutrition Plan: the Category 1 plan - ***% adherence.  Current exercise: {exercise types:16438}  Interim History:  *** {aabnutritionassessment:29213}   Pharmacotherapy: Dennis Castro is on {dwwpharmacotherapy:29109} Adverse side effects: {dwwse:29122} Hunger is {EWCONTROLASSESSMENT:24261}.  Cravings are {EWCONTROLASSESSMENT:24261}.  Assessment/Plan:   There are no diagnoses linked to this encounter.    {dwwmorbid:29108::Morbid Obesity}: Current BMI No data recorded  Pharmacotherapy Plan {dwwmed:29123}  {dwwpharmacotherapy:29109}  Dennis Castro {CHL AMB IS/IS NOT:210130109} currently in the action stage of change. As such, his goal is to {MWMwtloss#1:210800005}.  He has agreed to {dwwsldiets:29085}.  Exercise goals: {MWM EXERCISE RECS:23473}  Behavioral modification strategies: {dwwslwtlossstrategies:29088}.  Dennis Castro has agreed to follow-up with our clinic in {NUMBER 1-10:22536} weeks.   No orders of the defined types were placed in this encounter.   There are no discontinued medications.   No orders of the defined types were placed in this encounter.     Objective:   VITALS: Per patient if applicable, see vitals. GENERAL: Alert and in no acute distress. CARDIOPULMONARY: No increased WOB. Speaking in clear sentences.  PSYCH: Pleasant and cooperative. Speech normal rate and rhythm. Affect is appropriate. Insight and judgement are appropriate. Attention is focused, linear, and appropriate.  NEURO: Oriented as arrived to appointment on time with no prompting.   Attestation Statements:    This was prepared with the assistance of Engineer, civil (consulting).  Occasional wrong-word or sound-a-like substitutions may have occurred due to the inherent limitations of voice recognition

## 2023-10-24 ENCOUNTER — Ambulatory Visit: Admitting: Bariatrics

## 2023-10-29 ENCOUNTER — Ambulatory Visit
Admission: RE | Admit: 2023-10-29 | Discharge: 2023-10-29 | Disposition: A | Discharge status: Home / Self Care | Source: Ambulatory Visit | Attending: Neurosurgery | Admitting: Neurosurgery

## 2023-10-29 DIAGNOSIS — E236 Other disorders of pituitary gland: Secondary | ICD-10-CM

## 2023-10-29 MED ORDER — GADOPICLENOL 0.5 MMOL/ML IV SOLN
10.0000 mL | Freq: Once | INTRAVENOUS | Status: AC | PRN
  Administered 2023-10-29: 10 mL via INTRAVENOUS

## 2023-11-19 ENCOUNTER — Encounter: Payer: Self-pay | Admitting: Bariatrics

## 2023-11-19 ENCOUNTER — Ambulatory Visit: Admitting: Bariatrics

## 2023-11-19 VITALS — BP 106/74 | HR 65 | Temp 98.5°F | Ht 77.0 in | Wt 275.0 lb

## 2023-11-19 DIAGNOSIS — E559 Vitamin D deficiency, unspecified: Secondary | ICD-10-CM | POA: Diagnosis not present

## 2023-11-19 DIAGNOSIS — E66811 Obesity, class 1: Secondary | ICD-10-CM

## 2023-11-19 DIAGNOSIS — E782 Mixed hyperlipidemia: Secondary | ICD-10-CM | POA: Diagnosis not present

## 2023-11-19 DIAGNOSIS — Z6832 Body mass index (BMI) 32.0-32.9, adult: Secondary | ICD-10-CM

## 2023-11-19 DIAGNOSIS — E669 Obesity, unspecified: Secondary | ICD-10-CM

## 2023-11-19 MED ORDER — VITAMIN D3 1.25 MG (50000 UT) PO CAPS
ORAL_CAPSULE | ORAL | 0 refills | Status: AC
Start: 2023-11-19 — End: ?

## 2023-11-19 NOTE — Progress Notes (Signed)
 WEIGHT SUMMARY AND BIOMETRICS  Weight Lost Since Last Visit: 0  Weight Gained Since Last Visit: 2lb   Vitals Temp: 98.5 F (36.9 C) BP: 106/74 Pulse Rate: 65 SpO2: 96 %   Anthropometric Measurements Height: 6' 5 (1.956 m) Weight: 275 lb (124.7 kg) BMI (Calculated): 32.6 Weight at Last Visit: 273lb Weight Lost Since Last Visit: 0 Weight Gained Since Last Visit: 2lb Starting Weight: 327lb Total Weight Loss (lbs): 50 lb (22.7 kg)   Body Composition  Body Fat %: 30.9 % Fat Mass (lbs): 85.2 lbs Muscle Mass (lbs): 181.4 lbs Total Body Water  (lbs): 129.4 lbs Visceral Fat Rating : 18   Other Clinical Data Fasting: no Labs: no Today's Visit #: 22 Starting Date: 10/26/21    OBESITY Dennis Castro is here to discuss his progress with his obesity treatment plan along with follow-up of his obesity related diagnoses.    Nutrition Plan: the Category 4 plan - 50% adherence.  Current exercise: goes to the gym for exercise.  Interim History:  He is up 2 lbs since his last visit.  Eating all of the food on the plan., Is not skipping meals, Journaling consistently., and Water  intake is adequate.   Pharmacotherapy: Dennis Castro is not on any anti-obesity medications.  Hunger is moderately controlled.  Cravings are moderately controlled.  Assessment/Plan:   Vitamin D  insuffiencey:  Vitamin D  is not at goal of 50.  Most recent vitamin D  level was 43.4. He is on prescription D 3 50,000 international units every 14 days.   Lab Results  Component Value Date   VD25OH 43.4 07/30/2023   VD25OH 56.9 02/15/2022   VD25OH 33.8 10/26/2021    Plan: Refill prescription vitamin D  50,000 IU weekly.     Hyperlipidemia, mixed:  LDL is at goal. Medication(s): Lipitor  Cardiovascular risk factors: advanced age (older than 88 for men, 38 for women), dyslipidemia, male gender, and  obesity (BMI >= 30 kg/m2)  Lab Results  Component Value Date   CHOL 117 07/30/2023   HDL 41 07/30/2023   LDLCALC 58 07/30/2023   TRIG 96 07/30/2023   CHOLHDL 3 09/25/2022   Lab Results  Component Value Date   ALT 31 07/30/2023   AST 19 07/30/2023   ALKPHOS 96 07/30/2023   BILITOT 0.4 07/30/2023   The ASCVD Risk score (Arnett DK, et al., 2019) failed to calculate for the following reasons:   The valid total cholesterol range is 130 to 320 mg/dL  Plan:  Continue statin.  Discussed healthy vs unhealthy fats.  Will get back on track.  Will continue his exercise regimen.  Will avoid all trans fats.  Will read labels Will minimize saturated fats except the following: low fat meats in moderation, diary, and limited dark chocolate.      Generalized Obesity: Current BMI BMI (Calculated): 32.6   Dennis Castro is currently in the action stage  of change. As such, his goal is to continue with weight loss efforts.  He has agreed to the Category 4 plan.  Exercise goals: All adults should avoid inactivity. Some physical activity is better than none, and adults who participate in any amount of physical activity gain some health benefits.  Behavioral modification strategies: increasing lean protein intake, decrease eating out, meal planning , better snacking choices, planning for success, increasing vegetables, keep healthy foods in the home, and work on smaller portions.  Dennis Castro has agreed to follow-up with our clinic in 4 weeks.      Objective:   VITALS: Per patient if applicable, see vitals. GENERAL: Alert and in no acute distress. CARDIOPULMONARY: No increased WOB. Speaking in clear sentences.  PSYCH: Pleasant and cooperative. Speech normal rate and rhythm. Affect is appropriate. Insight and judgement are appropriate. Attention is focused, linear, and appropriate.  NEURO: Oriented as arrived to appointment on time with no prompting.   Attestation Statements:   This was prepared with  the assistance of Engineer, civil (consulting).  Occasional wrong-word or sound-a-like substitutions may have occurred due to the inherent limitations of voice recognition   Clayborne Daring, DO

## 2023-11-26 ENCOUNTER — Ambulatory Visit: Payer: Self-pay

## 2023-11-26 ENCOUNTER — Ambulatory Visit: Admitting: Physician Assistant

## 2023-11-26 ENCOUNTER — Encounter: Payer: Self-pay | Admitting: Physician Assistant

## 2023-11-26 VITALS — BP 122/80 | HR 68 | Temp 98.0°F | Ht 77.0 in | Wt 278.8 lb

## 2023-11-26 DIAGNOSIS — R0981 Nasal congestion: Secondary | ICD-10-CM | POA: Diagnosis not present

## 2023-11-26 DIAGNOSIS — R531 Weakness: Secondary | ICD-10-CM

## 2023-11-26 LAB — CBC WITH DIFFERENTIAL/PLATELET
Basophils Absolute: 0.1 K/uL (ref 0.0–0.1)
Basophils Relative: 1 % (ref 0.0–3.0)
Eosinophils Absolute: 0.1 K/uL (ref 0.0–0.7)
Eosinophils Relative: 1 % (ref 0.0–5.0)
HCT: 43.2 % (ref 39.0–52.0)
Hemoglobin: 14.3 g/dL (ref 13.0–17.0)
Lymphocytes Relative: 8 % — ABNORMAL LOW (ref 12.0–46.0)
Lymphs Abs: 0.5 K/uL — ABNORMAL LOW (ref 0.7–4.0)
MCHC: 33.2 g/dL (ref 30.0–36.0)
MCV: 88 fl (ref 78.0–100.0)
Monocytes Absolute: 0.7 K/uL (ref 0.1–1.0)
Monocytes Relative: 10.6 % (ref 3.0–12.0)
Neutro Abs: 5.2 K/uL (ref 1.4–7.7)
Neutrophils Relative %: 79.4 % — ABNORMAL HIGH (ref 43.0–77.0)
Platelets: 217 K/uL (ref 150.0–400.0)
RBC: 4.91 Mil/uL (ref 4.22–5.81)
RDW: 13.4 % (ref 11.5–15.5)
WBC: 6.5 K/uL (ref 4.0–10.5)

## 2023-11-26 LAB — COMPREHENSIVE METABOLIC PANEL WITH GFR
ALT: 24 U/L (ref 0–53)
AST: 17 U/L (ref 0–37)
Albumin: 4 g/dL (ref 3.5–5.2)
Alkaline Phosphatase: 76 U/L (ref 39–117)
BUN: 20 mg/dL (ref 6–23)
CO2: 30 meq/L (ref 19–32)
Calcium: 9.1 mg/dL (ref 8.4–10.5)
Chloride: 102 meq/L (ref 96–112)
Creatinine, Ser: 1.05 mg/dL (ref 0.40–1.50)
GFR: 76.23 mL/min (ref >60.00)
Glucose, Bld: 112 mg/dL — ABNORMAL HIGH (ref 70–99)
Potassium: 4.2 meq/L (ref 3.5–5.1)
Sodium: 141 meq/L (ref 135–145)
Total Bilirubin: 0.7 mg/dL (ref 0.2–1.2)
Total Protein: 6.2 g/dL (ref 6.0–8.3)

## 2023-11-26 LAB — POC COVID19 BINAXNOW: SARS Coronavirus 2 Ag: NEGATIVE

## 2023-11-26 MED ORDER — AZELASTINE HCL 0.1 % NA SOLN: 1.0000 | Freq: Two times a day (BID) | NASAL | 0 refills | Status: AC

## 2023-11-26 NOTE — Telephone Encounter (Signed)
 FYI Only or Action Required?: Action required by provider: request for appointment.  Patient was last seen in primary care on 11/19/2023 by Dennis Castro A, DO.  Called Nurse Triage reporting Sinusitis.  Symptoms began yesterday.  Interventions attempted: Nothing.  Symptoms are: unchanged.Has body aches, chills as well.  Triage Disposition: See Physician Within 24 Hours  Patient/caregiver understands and will follow disposition?: Yes   Copied from CRM #[id number]. Topic: Clinical - Red Word Triage >> Nov 26, 2023  7:40 AM Turkey A wrote: [facility] that prompted transfer to Nurse Triage: body aches,fever,chills,nasal congestion. Started yesterday Reason for Disposition  [1] Sinus pain (not just congestion) AND [2] fever  Answer Assessment - Initial Assessment Questions 1. LOCATION: Where does it hurt?      Face, neck 2. ONSET: When did the sinus pain start?  (e.g., hours, days)      Sunday 3. SEVERITY: How bad is the pain?   (Scale 0-10; or none, mild, moderate or severe)     moderate 4. RECURRENT SYMPTOM: Have you ever had sinus problems before? If Yes, ask: When was the last time? and What happened that time?      yes 5. NASAL CONGESTION: Is the nose blocked? If Yes, ask: Can you open it or must you breathe through your mouth?     no 6. NASAL DISCHARGE: Do you have discharge from your nose? If so ask, What color?     Feels swollen 7. FEVER: Do you have a fever? If Yes, ask: What is it, how was it measured, and when did it start?      yes 8. OTHER SYMPTOMS: Do you have any other symptoms? (e.g., sore throat, cough, earache, difficulty breathing)     Chills, body aches 9. PREGNANCY: Is there any chance you are pregnant? When was your last menstrual period?     N/a  Protocols used: Sinus Pain or Congestion-A-AH

## 2023-11-26 NOTE — Progress Notes (Signed)
 Established patient visit   Patient: Dennis Castro.   DOB: 02/13/1961   63 y.o. Male  MRN: [id number] Visit Date: 11/26/2023  Today's healthcare provider: Manuelita Flatness, PA-C   Cc. Weakness, feverish, nasal congestion  Subjective     Discussed the use of AI scribe software for clinical note transcription with the patient, who gave verbal consent to proceed.  History of Present Illness   Dennis Castro. is a 63 year old male who presents with generalized weakness, chills, and body aches.  Pt reports over the weekend he was at the gym and started to feel unwell, weak and dizzy. He did mow the lawn over the weekend and saw a tick but denies any bites or rashes.   Upon waking today, he feels slightly improved but still experiences pressure in his head, generalized body aches, and weakness. He took Aleve  last night and Tylenol  at 7 AM today. The weakness is diffuse, with no lateralization. He has chronic left sided bells palsy that is unchanged.   He has been cutting carbohydrates and notes that he may not have consumed enough to eat the day he felt unwell. He typically drinks a lot of water  but acknowledges he may not be drinking as much as he thought.  Reports nasal congestion last few days. Denies anything OTC for this. Denies cough, chest pain.      Medications: Outpatient Medications Prior to Visit  Medication Sig   ALPHA LIPOIC ACID PO Take 600 mg by mouth daily.   aspirin EC 81 MG tablet Take 81 mg by mouth daily.   atorvastatin  (LIPITOR) 20 MG tablet TAKE ONE TABLET BY MOUTH EVERY NIGHT AT BEDTIME   Cholecalciferol (VITAMIN D3) 1.25 MG (50000 UT) CAPS Take one po every 14 days   colestipol  (COLESTID ) 1 g tablet TAKE 1 TABLET (1 GIVE TOTAL) BY MOUTH 2 (TWO) TIMES DAILY AS NEEDED. PLEASE KEEP YOUR MARCH 7 APPOINTMENT FOR FURTHER REFILLS. THANK YOU   erythromycin  ophthalmic ointment Place a 1/2 inch ribbon of ointment into the lower eyelid.   hydrocortisone  2.5 %  cream Apply topically 2 (two) times daily.   methylcellulose (CITRUCEL) oral powder If needed   metoprolol  tartrate (LOPRESSOR ) 25 MG tablet TAKE 0.5 TABLET (12.5 MG TOTAL) BY MOUTH TWICE DAILY   Multiple Vitamin (MULTIVITAMIN WITH MINERALS) TABS tablet Take 1 tablet by mouth daily.   naproxen  sodium (ALEVE ) 220 MG tablet Take 1 tablet (220 mg total) by mouth daily as needed.   Omega-3 Fatty Acids (FISH OIL ) 1000 MG CAPS Take 2 capsules (2,000 mg total) by mouth daily.   sildenafil  (REVATIO ) 20 MG tablet Take 3-4 tablets (60-80 mg total) by mouth at bedtime as needed.   tiZANidine  (ZANAFLEX ) 4 MG tablet Take 1 tablet (4 mg total) by mouth every 8 (eight) hours as needed for muscle spasms.   No facility-administered medications prior to visit.    Review of Systems  Constitutional:  Negative for fatigue and fever.  HENT:  Positive for congestion.   Respiratory:  Negative for cough and shortness of breath.   Cardiovascular:  Negative for chest pain, palpitations and leg swelling.  Neurological:  Positive for dizziness and weakness. Negative for headaches.       Objective    BP 122/80 (BP Location: Right Arm)   Pulse 68   Temp 98 F (36.7 C)   Ht 6' 5 (1.956 m)   Wt 278 lb 12.8 oz (126.5 kg)  SpO2 96%   BMI 33.06 kg/m    Physical Exam Constitutional:      General: He is awake.     Appearance: He is well-developed.  HENT:     Head: Normocephalic.  Eyes:     Conjunctiva/sclera: Conjunctivae normal.  Cardiovascular:     Rate and Rhythm: Normal rate and regular rhythm.     Heart sounds: Normal heart sounds.  Pulmonary:     Effort: Pulmonary effort is normal.     Breath sounds: Normal breath sounds.  Skin:    General: Skin is warm.  Neurological:     Mental Status: He is alert and oriented to person, place, and time.     Comments: Chronic mild L side facial drooping d/t bells palsy, most pronounced in mouth  Psychiatric:        Attention and Perception: Attention  normal.        Mood and Affect: Mood normal.        Speech: Speech normal.        Behavior: Behavior is cooperative.      Results for orders placed or performed in visit on 11/26/23  POC COVID-19 BinaxNow  Result Value Ref Range   SARS Coronavirus 2 Ag Negative Negative    Assessment & Plan    Weakness -     CBC with Differential/Platelet -     Comprehensive metabolic panel with GFR -     POC COVID-19 BinaxNow  Nasal congestion -     Azelastine  HCl; Place 1 spray into both nostrils 2 (two) times daily. Use in each nostril as directed  Dispense: 30 mL; Refill: 0 -     POC COVID-19 BinaxNow  Symptoms sound 2/2 to overexertion - covid negative. - will check cmp, cbc - Encourage hydration with water  and electrolyte-containing fluids like Gatorade - Monitor blood pressure at home. Initial soft BP from BP machine was rechecked manually bilateral arms and is in a normal range for pt - Prescribe nasal spray for sinus symptoms. - Recommend starting Claritin for allergy relief. - Advise to monitor symptoms and contact if he persists or worsens    Return if symptoms worsen or fail to improve.       Manuelita Flatness, PA-C  [facility] [phone] (phone) [phone] (fax)  [facility]

## 2023-11-26 NOTE — Telephone Encounter (Signed)
 Appt scheduled

## 2023-11-27 ENCOUNTER — Ambulatory Visit: Payer: Self-pay | Admitting: Physician Assistant

## 2023-11-27 DIAGNOSIS — R531 Weakness: Secondary | ICD-10-CM

## 2023-11-29 ENCOUNTER — Other Ambulatory Visit: Payer: Self-pay | Admitting: Cardiovascular Disease

## 2023-12-18 ENCOUNTER — Encounter: Payer: Self-pay | Admitting: Gastroenterology

## 2023-12-24 ENCOUNTER — Ambulatory Visit: Admitting: Bariatrics

## 2023-12-27 ENCOUNTER — Telehealth (HOSPITAL_COMMUNITY): Payer: Self-pay | Admitting: Cardiovascular Disease

## 2023-12-27 NOTE — Telephone Encounter (Signed)
 Patient called and cancelled echocardiogram for reason below:  12/27/2023 10:10 AM Ab:DFPUY, SHAWANA A  Cancel Rsn: Patient (unable to make appt; will c/b to r/s)   Order will be removed from the echo Wq and if patient calls back we will reinstate the order. Thank you.

## 2024-01-07 ENCOUNTER — Encounter: Payer: Self-pay | Admitting: Bariatrics

## 2024-01-07 ENCOUNTER — Ambulatory Visit: Admitting: Bariatrics

## 2024-01-07 DIAGNOSIS — Z6832 Body mass index (BMI) 32.0-32.9, adult: Secondary | ICD-10-CM

## 2024-01-07 DIAGNOSIS — E559 Vitamin D deficiency, unspecified: Secondary | ICD-10-CM | POA: Diagnosis not present

## 2024-01-07 DIAGNOSIS — E669 Obesity, unspecified: Secondary | ICD-10-CM | POA: Diagnosis not present

## 2024-01-07 NOTE — Progress Notes (Signed)
 WEIGHT SUMMARY AND BIOMETRICS  Weight Lost Since Last Visit: 0  Weight Gained Since Last Visit: 2lb   Vitals Temp: 98.2 F (36.8 C) BP: 110/62 Pulse Rate: 65 SpO2: 93 %   Anthropometric Measurements Height: 6' 5 (1.956 m) Weight: 277 lb (125.6 kg) BMI (Calculated): 32.84 Weight at Last Visit: 275lb Weight Lost Since Last Visit: 0 Weight Gained Since Last Visit: 2lb Starting Weight: 327lb Total Weight Loss (lbs): 48 lb (21.8 kg)   Body Composition  Body Fat %: 31.1 % Fat Mass (lbs): 86.2 lbs Muscle Mass (lbs): 181.8 lbs Total Body Water  (lbs): 125.2 lbs Visceral Fat Rating : 18   Other Clinical Data Fasting: no Labs: no Today's Visit #: 23 Starting Date: 10/26/21    OBESITY Alin is here to discuss his progress with his obesity treatment plan along with follow-up of his obesity related diagnoses.    Nutrition Plan: the Category 4 plan - 30% adherence.  Current exercise: cardiovascular workout on exercise equipment and weightlifting.He has switched his exercise regimen to a home routine.   Interim History:  He is up 2 lbs since his last visit. He has had some stressors with a recent diagnosis of a family member.  Protein intake is as prescribed, Is not skipping meals, and Water  intake is adequate.  Hunger is moderately controlled.  Cravings are moderately controlled.  Assessment/Plan:   Vitamin D  Insuffiency:  Vitamin D  is not at goal of 50.  Most recent vitamin D  level was 43.4. He is on  prescription ergocalciferol  50,000 IU weekly. Lab Results  Component Value Date   VD25OH 43.4 07/30/2023   VD25OH 56.9 02/15/2022   VD25OH 33.8 10/26/2021    Plan: Continue prescription vitamin D  50,000 IU weekly.  Will track his protein and calories on a regular basis. He will use  My Fitness Pal.  Will exercise at home. Will continue to track  his fiber and increase his sources.     Generalized Obesity: Current BMI BMI (Calculated): 32.84    Shante is currently in the action stage of change. As such, his goal is to continue with weight loss efforts.  He has agreed to the Category 4 plan.  Exercise goals: All adults should avoid inactivity. Some physical activity is better than none, and adults who participate in any amount of physical activity gain some health benefits. He will continue to walk and do a home routine.    Behavioral modification strategies: increasing lean protein intake, no meal skipping, increase water  intake, better snacking choices, planning for success, increasing vegetables, increasing fiber rich foods, avoiding temptations, and keep healthy foods in the home.  Maxine has agreed to follow-up with our clinic in 4 weeks.    Objective:   VITALS: Per patient if applicable, see vitals. GENERAL: Alert and in no acute distress. CARDIOPULMONARY: No increased WOB. Speaking in clear sentences.  PSYCH: Pleasant and cooperative. Speech normal rate and rhythm. Affect is appropriate. Insight and judgement are appropriate. Attention is focused, linear, and appropriate.  NEURO: Oriented as arrived to appointment on time with no prompting.   Attestation Statements:   This was prepared with the assistance of Engineer, civil (consulting).  Occasional wrong-word or sound-a-like substitutions may have occurred due to the inherent limitations of voice recognition   Clayborne Daring, DO

## 2024-01-11 ENCOUNTER — Other Ambulatory Visit (HOSPITAL_COMMUNITY): Payer: 59

## 2024-02-08 ENCOUNTER — Encounter

## 2024-02-11 ENCOUNTER — Ambulatory Visit: Admitting: Bariatrics

## 2024-02-22 ENCOUNTER — Encounter: Admitting: Gastroenterology

## 2024-02-26 ENCOUNTER — Other Ambulatory Visit: Payer: Self-pay | Admitting: Cardiovascular Disease

## 2024-03-11 ENCOUNTER — Encounter: Payer: Self-pay | Admitting: Gastroenterology

## 2024-03-31 ENCOUNTER — Other Ambulatory Visit: Payer: Self-pay | Admitting: Cardiovascular Disease

## 2024-04-21 ENCOUNTER — Ambulatory Visit: Payer: Self-pay | Attending: Cardiovascular Disease | Admitting: Cardiovascular Disease

## 2024-04-21 ENCOUNTER — Encounter: Payer: Self-pay | Admitting: Cardiovascular Disease

## 2024-04-21 VITALS — BP 110/70 | HR 64 | Ht 77.0 in | Wt 301.0 lb

## 2024-04-21 DIAGNOSIS — I2510 Atherosclerotic heart disease of native coronary artery without angina pectoris: Secondary | ICD-10-CM

## 2024-04-21 DIAGNOSIS — I7121 Aneurysm of the ascending aorta, without rupture: Secondary | ICD-10-CM | POA: Diagnosis not present

## 2024-04-21 DIAGNOSIS — E782 Mixed hyperlipidemia: Secondary | ICD-10-CM | POA: Diagnosis not present

## 2024-04-21 MED ORDER — ATORVASTATIN CALCIUM 20 MG PO TABS: 20.0000 mg | ORAL_TABLET | Freq: Every day | ORAL | 3 refills | Status: AC

## 2024-04-21 NOTE — Assessment & Plan Note (Signed)
 History of small thoracic aortic aneurysm measuring 44 mm by 2D echo 01/03/2023 representing no change from the prior study.  We will repeat a 2D echocardiogram.

## 2024-04-21 NOTE — Assessment & Plan Note (Signed)
 History of noncritical CAD with cath performed by myself 06/20/2009 revealing at most to 30% hypertension segmental mid LAD stenosis.  Did have a coronary calcium  score performed 5//23 that was 212 the majority of which was in the LAD.  FFR analysis showed this not to be physiologically significant.  He is active and asymptomatic.  He is at goal with regards to his lipid profile for secondary prevention.

## 2024-04-21 NOTE — Patient Instructions (Addendum)
 Medication Instructions:  Your physician recommends that you continue on your current medications as directed. Please refer to the Current Medication list given to you today.  *If you need a refill on your cardiac medications before your next appointment, please call your pharmacy*   Testing/Procedures: Your physician has requested that you have an echocardiogram. Echocardiography is a painless test that uses sound waves to create images of your heart. It provides your doctor with information about the size and shape of your heart and how well your hearts chambers and valves are working. This procedure takes approximately one hour. There are no restrictions for this procedure. Please do NOT wear cologne, perfume, aftershave, or lotions (deodorant is allowed). Please arrive 15 minutes prior to your appointment time.  Please note: We ask at that you not bring children with you during ultrasound (echo/ vascular) testing. Due to room size and safety concerns, children are not allowed in the ultrasound rooms during exams. Our front office staff cannot provide observation of children in our lobby area while testing is being conducted. An adult accompanying a patient to their appointment will only be allowed in the ultrasound room at the discretion of the ultrasound technician under special circumstances. We apologize for any inconvenience.   Follow-Up: At [facility], you and your health needs are our priority.  As part of our continuing mission to provide you with exceptional heart care, our providers are all part of one team.  This team includes your primary Cardiologist (physician) and Advanced Practice Providers or APPs (Physician Assistants and Nurse Practitioners) who all work together to provide you with the care you need, when you need it.  Your next appointment:   12 month(s)  Provider:   Dorn Lesches, MD    We recommend signing up for the patient portal called MyChart.  Sign  up information is provided on this After Visit Summary.  MyChart is used to connect with patients for Virtual Visits (Telemedicine).  Patients are able to view lab/test results, encounter notes, upcoming appointments, etc.  Non-urgent messages can be sent to your provider as well.   To learn more about what you can do with MyChart, go to [email or web address].   Other Instructions

## 2024-04-21 NOTE — Assessment & Plan Note (Signed)
 BMI 36.  He has got up and down 20 pounds over the years usually losing weight as a result of diet and exercise and gaining weight on prednisone .

## 2024-04-21 NOTE — Assessment & Plan Note (Signed)
 History of hyperlipidemia on statin therapy with lipid profile performed 07/30/23 revealing total cholesterol 117, LDL 58 HDL 41.

## 2024-04-21 NOTE — Progress Notes (Signed)
 "     04/21/2024 Dennis Castro.   14-Apr-1960  [id number]  Primary Physician Amon Aloysius FORBES, MD Primary Cardiologist: Dorn JINNY Lesches MD [facility], [place], [place]  HPI:  Dennis Castro. is a 64 y.o.    severely overweight married Caucasian male father of one  Who works at [employer] in [place]. I last saw him in the office 03/21/2023.  He has seen Jackee Alberts, PA-C since that time.SABRA He was having atypical chest pain and had a Myoview stress test that showed subtle anterior ischemia. Based on this he underwent cardiac catheterization by myself 06/24/09 revealing noncritical CAD with at most a 30% hypodense segmental mid LAD stent stenosis with very subtle anteroapical hypokinesia. Medical therapy is recommended. He continued to have atypical chest pain. He does have a family history of heart disease with a mother who had stents in her 13s and bypass surgery at 71.   He was seen in the ER 07/05/2017 with atypical chest pain.  His EKG showed no acute changes and he ruled out by enzymes.  He had no recurrent symptoms.   He had gone on a diet and exercise campaign and is lost 45 pounds down from 335--->290 #.  He felt clinically improved.  Unfortunately, he has gained all the weight back for various reasons.   He had a hernia repair and a gallbladder removed.  There are no symptoms compatible with obstructive sleep apnea.   He had a 2D echocardiogram performed 01/03/2022 that was essentially normal except for an ascending thoracic aorta measuring 43 mm which we will need to follow on annual basis.  A coronary calcium  score performed 08/04/21 was 212 the majority of which was in the LAD territory.  Subsequent FFR analysis did not show physiologic significance.   He received the COVID-vaccine last year and 2 weeks later developed Bell's palsy on the left side which she is still symptomatic from despite prednisone  therapy..  He has been on prednisone  and gained 20 pounds as a result.  He  exercises at the gym 4 days/week lifting weights and doing treadmill.  He denies chest pain or shortness of breath.   Active Medications[1]   Allergies[2]  Social History   Socioeconomic History   Marital status: Married    Spouse name: Warren   Number of children: 1   Years of education: Not on file   Highest education level: Not on file  Occupational History   Occupationhydrologist, office job  Tobacco Use   Smoking status: Never   Smokeless tobacco: Never  Vaping Use   Vaping status: Never Used  Substance and Sexual Activity   Alcohol use: No   Drug use: No   Sexual activity: Yes  Other Topics Concern   Not on file  Social History Narrative   Teenage son graduated from [employer], lives w/ them   Social Drivers of Health   Tobacco Use: Low Risk (04/21/2024)   Patient History    Smoking Tobacco Use: Never    Smokeless Tobacco Use: Never    Passive Exposure: Not on file  Financial Resource Strain: Not on file  Food Insecurity: Not on file  Transportation Needs: Not on file  Physical Activity: Not on file  Stress: Not on file  Social Connections: Not on file  Intimate Partner Violence: Not on file  Depression (EYV7-0): Low Risk (11/26/2023)   Depression (PHQ2-9)    PHQ-2 Score: 0  Alcohol Screen: Not on file  Housing: Unknown (  07/22/2023)   Received from [facility]   Epic    Unable to Pay for Housing in the Last Year: Not on file    Number of Times Moved in the Last Year: Not on file    At any time in the past 12 months, were you homeless or living in a shelter (including now)?: No  Utilities: Not on file  Health Literacy: Not on file     Review of Systems: General: negative for chills, fever, night sweats or weight changes.  Cardiovascular: negative for chest pain, dyspnea on exertion, edema, orthopnea, palpitations, paroxysmal nocturnal dyspnea or shortness of breath Dermatological: negative for rash Respiratory: negative for cough or  wheezing Urologic: negative for hematuria Abdominal: negative for nausea, vomiting, diarrhea, bright red blood per rectum, melena, or hematemesis Neurologic: negative for visual changes, syncope, or dizziness All other systems reviewed and are otherwise negative except as noted above.    Blood pressure 110/70, pulse 64, height 6' 5 (1.956 m), weight (!) 301 lb (136.5 kg), SpO2 95%.  General appearance: alert and no distress Neck: no adenopathy, no carotid bruit, no JVD, supple, symmetrical, trachea midline, and thyroid  not enlarged, symmetric, no tenderness/mass/nodules Lungs: clear to auscultation bilaterally Heart: regular rate and rhythm, S1, S2 normal, no murmur, click, rub or gallop Extremities: extremities normal, atraumatic, no cyanosis or edema Pulses: 2+ and symmetric Skin: Skin color, texture, turgor normal. No rashes or lesions Neurologic: Grossly normal  EKG EKG Interpretation Date/Time:  Monday April 21 2024 08:10:08 EST Ventricular Rate:  64 PR Interval:  202 QRS Duration:  108 QT Interval:  410 QTC Calculation: 422 R Axis:   50  Text Interpretation: Normal sinus rhythm Normal ECG When compared with ECG of 21-Mar-2023 09:49, No significant change was found Confirmed by Court Carrier [phone]) on 04/21/2024 8:12:23 AM    ASSESSMENT AND PLAN:   Hyperlipidemia History of hyperlipidemia on statin therapy with lipid profile performed 07/30/23 revealing total cholesterol 117, LDL 58 HDL 41.  Morbid obesity (HCC) BMI 36.  He has got up and down 20 pounds over the years usually losing weight as a result of diet and exercise and gaining weight on prednisone .  CAD (coronary artery disease) History of noncritical CAD with cath performed by myself 06/20/2009 revealing at most to 30% hypertension segmental mid LAD stenosis.  Did have a coronary calcium  score performed 5//23 that was 212 the majority of which was in the LAD.  FFR analysis showed this not to be physiologically  significant.  He is active and asymptomatic.  He is at goal with regards to his lipid profile for secondary prevention.  Thoracic aortic aneurysm History of small thoracic aortic aneurysm measuring 44 mm by 2D echo 01/03/2023 representing no change from the prior study.  We will repeat a 2D echocardiogram.     Carrier DOROTHA Court MD FACP,FACC,FAHA, [facility] 04/21/2024 8:20 AM    [1]  Current Meds  Medication Sig   ALPHA LIPOIC ACID PO Take 600 mg by mouth daily.   aspirin EC 81 MG tablet Take 81 mg by mouth daily.   atorvastatin  (LIPITOR) 20 MG tablet TAKE 1 TABLET BY MOUTH AT BEDTIME . MUST SCHEDULE AN OVERDUE FOLLOW-UP APPOINTMENT WITH CARDIOLOGY FOR ANY MORE REFILLS. [phone]   azelastine  (ASTELIN ) 0.1 % nasal spray Place 1 spray into both nostrils 2 (two) times daily. Use in each nostril as directed   Cholecalciferol (VITAMIN D3) 1.25 MG (50000 UT) CAPS Take one po every 14 days  colestipol  (COLESTID ) 1 g tablet TAKE 1 TABLET (1 GIVE TOTAL) BY MOUTH 2 (TWO) TIMES DAILY AS NEEDED. PLEASE KEEP YOUR MARCH 7 APPOINTMENT FOR FURTHER REFILLS. THANK YOU   erythromycin  ophthalmic ointment Place a 1/2 inch ribbon of ointment into the lower eyelid.   hydrocortisone  2.5 % cream Apply topically 2 (two) times daily.   methylcellulose (CITRUCEL) oral powder If needed   metoprolol  tartrate (LOPRESSOR ) 25 MG tablet TAKE 0.5 TABLET (12.5 MG TOTAL) BY MOUTH TWICE DAILY   naproxen  sodium (ALEVE ) 220 MG tablet Take 1 tablet (220 mg total) by mouth daily as needed.   Omega-3 Fatty Acids (FISH OIL ) 1000 MG CAPS Take 2 capsules (2,000 mg total) by mouth daily.   sildenafil  (REVATIO ) 20 MG tablet Take 3-4 tablets (60-80 mg total) by mouth at bedtime as needed.   tiZANidine  (ZANAFLEX ) 4 MG tablet Take 1 tablet (4 mg total) by mouth every 8 (eight) hours as needed for muscle spasms.  [2] No Known Allergies  "

## 2024-05-16 ENCOUNTER — Ambulatory Visit (HOSPITAL_COMMUNITY)

## 2024-05-23 ENCOUNTER — Ambulatory Visit (HOSPITAL_COMMUNITY)

## 2024-10-06 ENCOUNTER — Encounter: Admitting: Internal Medicine
